# Patient Record
Sex: Male | Born: 1935 | Race: Black or African American | Hispanic: No | Marital: Married | Smoking: Never smoker
Health system: Southern US, Community
[De-identification: ages and names within clinical notes are randomized; demographics above are authoritative.]

## PROBLEM LIST (undated history)

## (undated) DIAGNOSIS — I4891 Unspecified atrial fibrillation: Secondary | ICD-10-CM

## (undated) DIAGNOSIS — I2510 Atherosclerotic heart disease of native coronary artery without angina pectoris: Secondary | ICD-10-CM

## (undated) DIAGNOSIS — E119 Type 2 diabetes mellitus without complications: Secondary | ICD-10-CM

## (undated) DIAGNOSIS — I10 Essential (primary) hypertension: Secondary | ICD-10-CM

## (undated) DIAGNOSIS — Z8579 Personal history of other malignant neoplasms of lymphoid, hematopoietic and related tissues: Secondary | ICD-10-CM

## (undated) DIAGNOSIS — I639 Cerebral infarction, unspecified: Secondary | ICD-10-CM

## (undated) DIAGNOSIS — N400 Enlarged prostate without lower urinary tract symptoms: Secondary | ICD-10-CM

## (undated) DIAGNOSIS — E785 Hyperlipidemia, unspecified: Secondary | ICD-10-CM

## (undated) DIAGNOSIS — K219 Gastro-esophageal reflux disease without esophagitis: Secondary | ICD-10-CM

## (undated) DIAGNOSIS — K279 Peptic ulcer, site unspecified, unspecified as acute or chronic, without hemorrhage or perforation: Secondary | ICD-10-CM

## (undated) DIAGNOSIS — C9002 Multiple myeloma in relapse: Principal | ICD-10-CM

## (undated) DIAGNOSIS — C801 Malignant (primary) neoplasm, unspecified: Secondary | ICD-10-CM

## (undated) DIAGNOSIS — Z8719 Personal history of other diseases of the digestive system: Secondary | ICD-10-CM

## (undated) DIAGNOSIS — E871 Hypo-osmolality and hyponatremia: Secondary | ICD-10-CM

## (undated) DIAGNOSIS — I509 Heart failure, unspecified: Secondary | ICD-10-CM

## (undated) HISTORY — DX: Multiple myeloma in relapse: C90.02

## (undated) HISTORY — DX: Benign prostatic hyperplasia without lower urinary tract symptoms: N40.0

## (undated) HISTORY — DX: Essential (primary) hypertension: I10

## (undated) HISTORY — DX: Type 2 diabetes mellitus without complications: E11.9

## (undated) HISTORY — DX: Gastro-esophageal reflux disease without esophagitis: K21.9

## (undated) HISTORY — DX: Heart failure, unspecified: I50.9

## (undated) HISTORY — DX: Hyperlipidemia, unspecified: E78.5

## (undated) HISTORY — PX: CORONARY ANGIOPLASTY: SHX604

## (undated) HISTORY — PX: EYE SURGERY: SHX253

## (undated) HISTORY — DX: Personal history of other malignant neoplasms of lymphoid, hematopoietic and related tissues: Z85.79

## (undated) HISTORY — DX: Unspecified atrial fibrillation: I48.91

## (undated) HISTORY — DX: Personal history of other diseases of the digestive system: Z87.19

## (undated) HISTORY — PX: CARDIAC CATHETERIZATION: SHX172

## (undated) HISTORY — DX: Peptic ulcer, site unspecified, unspecified as acute or chronic, without hemorrhage or perforation: K27.9

## (undated) HISTORY — DX: Atherosclerotic heart disease of native coronary artery without angina pectoris: I25.10

## (undated) HISTORY — DX: Hypo-osmolality and hyponatremia: E87.1

---

## 2005-07-26 ENCOUNTER — Ambulatory Visit: Payer: Self-pay | Admitting: Family Medicine

## 2005-09-07 ENCOUNTER — Encounter (HOSPITAL_COMMUNITY): Admission: RE | Admit: 2005-09-07 | Discharge: 2005-10-07 | Payer: Self-pay | Admitting: Family Medicine

## 2005-09-21 ENCOUNTER — Emergency Department: Payer: Self-pay | Admitting: Emergency Medicine

## 2005-09-21 ENCOUNTER — Other Ambulatory Visit: Payer: Self-pay

## 2005-11-16 ENCOUNTER — Ambulatory Visit: Payer: Self-pay | Admitting: Anesthesiology

## 2005-11-22 ENCOUNTER — Ambulatory Visit: Payer: Self-pay | Admitting: Anesthesiology

## 2005-12-23 ENCOUNTER — Ambulatory Visit: Payer: Self-pay | Admitting: Anesthesiology

## 2005-12-27 ENCOUNTER — Ambulatory Visit: Payer: Self-pay | Admitting: Anesthesiology

## 2006-02-09 ENCOUNTER — Ambulatory Visit: Payer: Self-pay | Admitting: Anesthesiology

## 2006-03-08 ENCOUNTER — Ambulatory Visit: Payer: Self-pay | Admitting: Anesthesiology

## 2006-04-18 ENCOUNTER — Ambulatory Visit: Payer: Self-pay | Admitting: Anesthesiology

## 2007-03-11 ENCOUNTER — Emergency Department: Payer: Self-pay | Admitting: Internal Medicine

## 2011-11-30 ENCOUNTER — Ambulatory Visit: Payer: Self-pay | Admitting: Oncology

## 2011-12-10 ENCOUNTER — Ambulatory Visit: Payer: Self-pay | Admitting: Oncology

## 2011-12-10 LAB — COMPREHENSIVE METABOLIC PANEL
Alkaline Phosphatase: 103 U/L (ref 50–136)
Anion Gap: 9 (ref 7–16)
BUN: 13 mg/dL (ref 7–18)
Bilirubin,Total: 0.5 mg/dL (ref 0.2–1.0)
Calcium, Total: 8.3 mg/dL — ABNORMAL LOW (ref 8.5–10.1)
Co2: 24 mmol/L (ref 21–32)
Creatinine: 1.13 mg/dL (ref 0.60–1.30)
EGFR (Non-African Amer.): >60
Osmolality: 274 (ref 275–301)
Potassium: 3.8 mmol/L (ref 3.5–5.1)
SGPT (ALT): 73 U/L

## 2011-12-10 LAB — CBC CANCER CENTER
Basophil #: 0 x10 3/mm (ref 0.0–0.1)
Eosinophil #: 0 x10 3/mm (ref 0.0–0.7)
HCT: 29.9 % — ABNORMAL LOW (ref 40.0–52.0)
HGB: 10.1 g/dL — ABNORMAL LOW (ref 13.0–18.0)
Monocyte %: 2.3 %
Neutrophil #: 2.5 x10 3/mm (ref 1.4–6.5)
Neutrophil %: 61.2 %
Platelet: 119 x10 3/mm — ABNORMAL LOW (ref 150–440)
RBC: 3.01 10*6/uL — ABNORMAL LOW (ref 4.40–5.90)
RDW: 18.4 % — ABNORMAL HIGH (ref 11.5–14.5)

## 2011-12-10 LAB — URINALYSIS, COMPLETE
Bacteria: NONE SEEN
Bilirubin,UR: NEGATIVE
Glucose,UR: NEGATIVE mg/dL (ref 0–75)
Ph: 5 (ref 4.5–8.0)
Specific Gravity: 1.009 (ref 1.003–1.030)
Squamous Epithelial: NONE SEEN
WBC UR: NONE SEEN /HPF (ref 0–5)

## 2011-12-12 LAB — URINE CULTURE

## 2011-12-13 ENCOUNTER — Ambulatory Visit: Payer: Self-pay | Admitting: Oncology

## 2011-12-17 LAB — CBC CANCER CENTER
Eosinophil #: 0.1 x10 3/mm (ref 0.0–0.7)
Eosinophil %: 1.9 %
Lymphocyte %: 42.2 %
MCHC: 33.4 g/dL (ref 32.0–36.0)
Monocyte #: 0.3 x10 3/mm (ref 0.2–1.0)
Neutrophil %: 48.1 %
Platelet: 178 x10 3/mm (ref 150–440)
RDW: 18.4 % — ABNORMAL HIGH (ref 11.5–14.5)
WBC: 3.6 x10 3/mm — ABNORMAL LOW (ref 3.8–10.6)

## 2011-12-17 LAB — BASIC METABOLIC PANEL
BUN: 16 mg/dL (ref 7–18)
Calcium, Total: 8.2 mg/dL — ABNORMAL LOW (ref 8.5–10.1)
Co2: 27 mmol/L (ref 21–32)
EGFR (African American): 60 — ABNORMAL LOW
EGFR (Non-African Amer.): 52 — ABNORMAL LOW
Osmolality: 275 (ref 275–301)
Sodium: 135 mmol/L — ABNORMAL LOW (ref 136–145)

## 2011-12-24 LAB — BASIC METABOLIC PANEL
Anion Gap: 9 (ref 7–16)
BUN: 11 mg/dL (ref 7–18)
Co2: 26 mmol/L (ref 21–32)
Creatinine: 1.21 mg/dL (ref 0.60–1.30)
EGFR (African American): >60
EGFR (Non-African Amer.): 59 — ABNORMAL LOW
Glucose: 153 mg/dL — ABNORMAL HIGH (ref 65–99)
Osmolality: 280 (ref 275–301)
Potassium: 3.7 mmol/L (ref 3.5–5.1)
Sodium: 139 mmol/L (ref 136–145)

## 2011-12-24 LAB — CBC CANCER CENTER
Basophil #: 0 x10 3/mm (ref 0.0–0.1)
Eosinophil %: 1.5 %
Lymphocyte #: 1.2 x10 3/mm (ref 1.0–3.6)
MCH: 33.5 pg (ref 26.0–34.0)
MCHC: 33.1 g/dL (ref 32.0–36.0)
Monocyte %: 5.5 %
Neutrophil #: 1.8 x10 3/mm (ref 1.4–6.5)
Neutrophil %: 56 %
Platelet: 224 x10 3/mm (ref 150–440)
RBC: 3.01 10*6/uL — ABNORMAL LOW (ref 4.40–5.90)
WBC: 3.2 x10 3/mm — ABNORMAL LOW (ref 3.8–10.6)

## 2011-12-27 LAB — URINE IEP, RANDOM

## 2011-12-27 LAB — KAPPA/LAMBDA FREE LIGHT CHAINS (ARMC)

## 2011-12-27 LAB — PROT IMMUNOELECTROPHORES(ARMC)

## 2011-12-31 LAB — BASIC METABOLIC PANEL
Anion Gap: 7 (ref 7–16)
Calcium, Total: 8.2 mg/dL — ABNORMAL LOW (ref 8.5–10.1)
Chloride: 106 mmol/L (ref 98–107)
Co2: 25 mmol/L (ref 21–32)
Creatinine: 1.06 mg/dL (ref 0.60–1.30)
EGFR (African American): >60
EGFR (Non-African Amer.): >60
Glucose: 144 mg/dL — ABNORMAL HIGH (ref 65–99)
Potassium: 3.7 mmol/L (ref 3.5–5.1)
Sodium: 138 mmol/L (ref 136–145)

## 2011-12-31 LAB — CBC CANCER CENTER
Basophil #: 0 x10 3/mm (ref 0.0–0.1)
Eosinophil #: 0 x10 3/mm (ref 0.0–0.7)
HCT: 32.7 % — ABNORMAL LOW (ref 40.0–52.0)
MCH: 34.8 pg — ABNORMAL HIGH (ref 26.0–34.0)
MCHC: 34.2 g/dL (ref 32.0–36.0)
MCV: 102 fL — ABNORMAL HIGH (ref 80–100)
Monocyte #: 0.2 x10 3/mm (ref 0.2–1.0)
Neutrophil #: 1.9 x10 3/mm (ref 1.4–6.5)
Neutrophil %: 55.5 %
Platelet: 172 x10 3/mm (ref 150–440)
RBC: 3.22 10*6/uL — ABNORMAL LOW (ref 4.40–5.90)
RDW: 19.5 % — ABNORMAL HIGH (ref 11.5–14.5)

## 2012-01-07 LAB — BASIC METABOLIC PANEL
Anion Gap: 11 (ref 7–16)
BUN: 17 mg/dL (ref 7–18)
Calcium, Total: 8.4 mg/dL — ABNORMAL LOW (ref 8.5–10.1)
Chloride: 103 mmol/L (ref 98–107)
Creatinine: 1.18 mg/dL (ref 0.60–1.30)
EGFR (African American): >60
Glucose: 192 mg/dL — ABNORMAL HIGH (ref 65–99)
Osmolality: 281 (ref 275–301)
Potassium: 3.9 mmol/L (ref 3.5–5.1)
Sodium: 137 mmol/L (ref 136–145)

## 2012-01-07 LAB — CBC CANCER CENTER
Basophil #: 0 x10 3/mm (ref 0.0–0.1)
Basophil %: 0.6 %
HCT: 36 % — ABNORMAL LOW (ref 40.0–52.0)
HGB: 12.2 g/dL — ABNORMAL LOW (ref 13.0–18.0)
Lymphocyte #: 1.1 x10 3/mm (ref 1.0–3.6)
Lymphocyte %: 36.7 %
MCH: 34.9 pg — ABNORMAL HIGH (ref 26.0–34.0)
Monocyte #: 0.3 x10 3/mm (ref 0.2–1.0)
Monocyte %: 9.6 %
Neutrophil #: 1.6 x10 3/mm (ref 1.4–6.5)
Platelet: 158 x10 3/mm (ref 150–440)
RBC: 3.5 10*6/uL — ABNORMAL LOW (ref 4.40–5.90)
RDW: 19 % — ABNORMAL HIGH (ref 11.5–14.5)
WBC: 3.1 x10 3/mm — ABNORMAL LOW (ref 3.8–10.6)

## 2012-01-13 ENCOUNTER — Ambulatory Visit: Payer: Self-pay | Admitting: Oncology

## 2012-01-14 LAB — CBC CANCER CENTER
Basophil #: 0 x10 3/mm (ref 0.0–0.1)
Basophil %: 0.8 %
Eosinophil #: 0 x10 3/mm (ref 0.0–0.7)
HCT: 34.8 % — ABNORMAL LOW (ref 40.0–52.0)
Lymphocyte %: 35.1 %
MCH: 35.3 pg — ABNORMAL HIGH (ref 26.0–34.0)
MCHC: 34.6 g/dL (ref 32.0–36.0)
MCV: 102 fL — ABNORMAL HIGH (ref 80–100)
Monocyte #: 0.3 x10 3/mm (ref 0.2–1.0)
Monocyte %: 9 %
Platelet: 155 x10 3/mm (ref 150–440)
RDW: 18.2 % — ABNORMAL HIGH (ref 11.5–14.5)
WBC: 3.8 x10 3/mm (ref 3.8–10.6)

## 2012-01-14 LAB — BASIC METABOLIC PANEL
Anion Gap: 6 — ABNORMAL LOW (ref 7–16)
BUN: 13 mg/dL (ref 7–18)
Calcium, Total: 8.2 mg/dL — ABNORMAL LOW (ref 8.5–10.1)
Co2: 28 mmol/L (ref 21–32)
Creatinine: 1.22 mg/dL (ref 0.60–1.30)
EGFR (African American): >60
Potassium: 3.5 mmol/L (ref 3.5–5.1)

## 2012-01-28 LAB — BASIC METABOLIC PANEL
Anion Gap: 11 (ref 7–16)
Co2: 24 mmol/L (ref 21–32)
Creatinine: 1.19 mg/dL (ref 0.60–1.30)
EGFR (African American): >60
Glucose: 147 mg/dL — ABNORMAL HIGH (ref 65–99)
Osmolality: 282 (ref 275–301)
Sodium: 140 mmol/L (ref 136–145)

## 2012-01-28 LAB — CBC CANCER CENTER
Basophil #: 0 x10 3/mm (ref 0.0–0.1)
Eosinophil %: 1.2 %
HCT: 35.2 % — ABNORMAL LOW (ref 40.0–52.0)
HGB: 11.8 g/dL — ABNORMAL LOW (ref 13.0–18.0)
Lymphocyte #: 1.3 x10 3/mm (ref 1.0–3.6)
Lymphocyte %: 40.7 %
MCH: 34.4 pg — ABNORMAL HIGH (ref 26.0–34.0)
MCHC: 33.5 g/dL (ref 32.0–36.0)
Monocyte %: 9.1 %
Neutrophil #: 1.6 x10 3/mm (ref 1.4–6.5)
Neutrophil %: 48.5 %
RBC: 3.43 10*6/uL — ABNORMAL LOW (ref 4.40–5.90)
WBC: 3.3 x10 3/mm — ABNORMAL LOW (ref 3.8–10.6)

## 2012-02-04 LAB — CBC CANCER CENTER
Basophil %: 0.6 %
Eosinophil %: 1 %
HCT: 35.7 % — ABNORMAL LOW (ref 40.0–52.0)
HGB: 12.2 g/dL — ABNORMAL LOW (ref 13.0–18.0)
Lymphocyte #: 1.4 x10 3/mm (ref 1.0–3.6)
Lymphocyte %: 40.3 %
Monocyte %: 11.5 %
Neutrophil #: 1.6 x10 3/mm (ref 1.4–6.5)
Neutrophil %: 46.6 %
RBC: 3.53 10*6/uL — ABNORMAL LOW (ref 4.40–5.90)
RDW: 16.2 % — ABNORMAL HIGH (ref 11.5–14.5)
WBC: 3.5 x10 3/mm — ABNORMAL LOW (ref 3.8–10.6)

## 2012-02-04 LAB — BASIC METABOLIC PANEL
Anion Gap: 10 (ref 7–16)
BUN: 14 mg/dL (ref 7–18)
Calcium, Total: 8.3 mg/dL — ABNORMAL LOW (ref 8.5–10.1)
Chloride: 106 mmol/L (ref 98–107)
Co2: 26 mmol/L (ref 21–32)
EGFR (African American): >60
Glucose: 145 mg/dL — ABNORMAL HIGH (ref 65–99)
Osmolality: 286 (ref 275–301)
Potassium: 3.8 mmol/L (ref 3.5–5.1)
Sodium: 142 mmol/L (ref 136–145)

## 2012-02-11 LAB — CBC CANCER CENTER
Basophil #: 0 x10 3/mm (ref 0.0–0.1)
Basophil %: 0.6 %
Eosinophil %: 2 %
HGB: 12.9 g/dL — ABNORMAL LOW (ref 13.0–18.0)
MCH: 34.3 pg — ABNORMAL HIGH (ref 26.0–34.0)
MCHC: 33.8 g/dL (ref 32.0–36.0)
MCV: 102 fL — ABNORMAL HIGH (ref 80–100)
Neutrophil #: 1.8 x10 3/mm (ref 1.4–6.5)
Neutrophil %: 52.4 %
RBC: 3.77 10*6/uL — ABNORMAL LOW (ref 4.40–5.90)

## 2012-02-11 LAB — BASIC METABOLIC PANEL
Anion Gap: 9 (ref 7–16)
BUN: 17 mg/dL (ref 7–18)
Calcium, Total: 8.4 mg/dL — ABNORMAL LOW (ref 8.5–10.1)
Chloride: 107 mmol/L (ref 98–107)
Creatinine: 1.24 mg/dL (ref 0.60–1.30)
Osmolality: 286 (ref 275–301)

## 2012-02-13 ENCOUNTER — Ambulatory Visit: Payer: Self-pay | Admitting: Oncology

## 2012-02-18 LAB — CBC CANCER CENTER
Eosinophil #: 0.1 x10 3/mm (ref 0.0–0.7)
Eosinophil %: 1.5 %
HCT: 37.4 % — ABNORMAL LOW (ref 40.0–52.0)
HGB: 12.6 g/dL — ABNORMAL LOW (ref 13.0–18.0)
Lymphocyte %: 20.9 %
MCV: 101 fL — ABNORMAL HIGH (ref 80–100)
Monocyte %: 8.4 %
Neutrophil %: 68.3 %
Platelet: 156 x10 3/mm (ref 150–440)
RBC: 3.69 10*6/uL — ABNORMAL LOW (ref 4.40–5.90)
WBC: 5.7 x10 3/mm (ref 3.8–10.6)

## 2012-02-18 LAB — BASIC METABOLIC PANEL
Anion Gap: 9 (ref 7–16)
Chloride: 107 mmol/L (ref 98–107)
Co2: 24 mmol/L (ref 21–32)
Osmolality: 286 (ref 275–301)
Potassium: 3.6 mmol/L (ref 3.5–5.1)

## 2012-02-18 LAB — PROTIME-INR: Prothrombin Time: 15.9 secs — ABNORMAL HIGH (ref 11.5–14.7)

## 2012-02-25 LAB — CBC CANCER CENTER
Basophil #: 0.1 x10 3/mm (ref 0.0–0.1)
Eosinophil #: 0.1 x10 3/mm (ref 0.0–0.7)
Eosinophil %: 1.9 %
HGB: 12.6 g/dL — ABNORMAL LOW (ref 13.0–18.0)
Lymphocyte #: 1.1 x10 3/mm (ref 1.0–3.6)
MCH: 34.1 pg — ABNORMAL HIGH (ref 26.0–34.0)
Monocyte #: 0.4 x10 3/mm (ref 0.2–1.0)
Monocyte %: 7.8 %
Neutrophil %: 67.1 %
Platelet: 163 x10 3/mm (ref 150–440)
RBC: 3.7 10*6/uL — ABNORMAL LOW (ref 4.40–5.90)
RDW: 15.1 % — ABNORMAL HIGH (ref 11.5–14.5)

## 2012-02-25 LAB — PROTIME-INR
INR: 2.6
Prothrombin Time: 27.8 secs — ABNORMAL HIGH (ref 11.5–14.7)

## 2012-02-25 LAB — BASIC METABOLIC PANEL
Calcium, Total: 8.2 mg/dL — ABNORMAL LOW (ref 8.5–10.1)
Chloride: 108 mmol/L — ABNORMAL HIGH (ref 98–107)
Co2: 25 mmol/L (ref 21–32)
EGFR (African American): 49 — ABNORMAL LOW
EGFR (Non-African Amer.): 42 — ABNORMAL LOW
Glucose: 215 mg/dL — ABNORMAL HIGH (ref 65–99)
Osmolality: 290 (ref 275–301)
Potassium: 3.6 mmol/L (ref 3.5–5.1)
Sodium: 142 mmol/L (ref 136–145)

## 2012-03-03 LAB — BASIC METABOLIC PANEL
Calcium, Total: 7.7 mg/dL — ABNORMAL LOW (ref 8.5–10.1)
Chloride: 105 mmol/L (ref 98–107)
Co2: 26 mmol/L (ref 21–32)
Creatinine: 1.06 mg/dL (ref 0.60–1.30)
EGFR (Non-African Amer.): >60
Potassium: 3.8 mmol/L (ref 3.5–5.1)
Sodium: 141 mmol/L (ref 136–145)

## 2012-03-03 LAB — CBC CANCER CENTER
Basophil %: 1.1 %
Eosinophil #: 0.1 x10 3/mm (ref 0.0–0.7)
HGB: 12.8 g/dL — ABNORMAL LOW (ref 13.0–18.0)
Lymphocyte #: 1.1 x10 3/mm (ref 1.0–3.6)
MCH: 33.8 pg (ref 26.0–34.0)
MCHC: 33.5 g/dL (ref 32.0–36.0)
Monocyte #: 0.5 x10 3/mm (ref 0.2–1.0)
Neutrophil #: 3.2 x10 3/mm (ref 1.4–6.5)
Platelet: 191 x10 3/mm (ref 150–440)
RBC: 3.77 10*6/uL — ABNORMAL LOW (ref 4.40–5.90)
WBC: 5 x10 3/mm (ref 3.8–10.6)

## 2012-03-03 LAB — PROTIME-INR: INR: 2.9

## 2012-03-06 LAB — PROT IMMUNOELECTROPHORES(ARMC)

## 2012-03-10 LAB — CBC CANCER CENTER
Basophil %: 0.7 %
Eosinophil #: 0.1 x10 3/mm (ref 0.0–0.7)
Eosinophil %: 2.1 %
HCT: 36.9 % — ABNORMAL LOW (ref 40.0–52.0)
HGB: 12.3 g/dL — ABNORMAL LOW (ref 13.0–18.0)
Lymphocyte #: 0.8 x10 3/mm — ABNORMAL LOW (ref 1.0–3.6)
MCH: 33.2 pg (ref 26.0–34.0)
MCHC: 33.3 g/dL (ref 32.0–36.0)
MCV: 100 fL (ref 80–100)
Monocyte #: 0.5 x10 3/mm (ref 0.2–1.0)
Monocyte %: 7.8 %
Platelet: 165 x10 3/mm (ref 150–440)
RBC: 3.7 10*6/uL — ABNORMAL LOW (ref 4.40–5.90)

## 2012-03-10 LAB — BASIC METABOLIC PANEL
Anion Gap: 10 (ref 7–16)
Calcium, Total: 8.1 mg/dL — ABNORMAL LOW (ref 8.5–10.1)
Chloride: 108 mmol/L — ABNORMAL HIGH (ref 98–107)
Co2: 23 mmol/L (ref 21–32)
Creatinine: 1.19 mg/dL (ref 0.60–1.30)
EGFR (African American): >60
EGFR (Non-African Amer.): 60 — ABNORMAL LOW
Glucose: 165 mg/dL — ABNORMAL HIGH (ref 65–99)
Osmolality: 285 (ref 275–301)
Potassium: 3.4 mmol/L — ABNORMAL LOW (ref 3.5–5.1)
Sodium: 141 mmol/L (ref 136–145)

## 2012-03-10 LAB — PROTIME-INR: Prothrombin Time: 36.4 secs — ABNORMAL HIGH (ref 11.5–14.7)

## 2012-03-14 ENCOUNTER — Ambulatory Visit: Payer: Self-pay | Admitting: Oncology

## 2012-03-17 LAB — BASIC METABOLIC PANEL
BUN: 13 mg/dL (ref 7–18)
Chloride: 104 mmol/L (ref 98–107)
Creatinine: 1.05 mg/dL (ref 0.60–1.30)
Glucose: 184 mg/dL — ABNORMAL HIGH (ref 65–99)
Osmolality: 281 (ref 275–301)
Potassium: 3.7 mmol/L (ref 3.5–5.1)
Sodium: 138 mmol/L (ref 136–145)

## 2012-03-17 LAB — CBC CANCER CENTER
Eosinophil %: 3.4 %
HGB: 12.2 g/dL — ABNORMAL LOW (ref 13.0–18.0)
Lymphocyte #: 0.9 x10 3/mm — ABNORMAL LOW (ref 1.0–3.6)
Lymphocyte %: 16.8 %
MCHC: 33.4 g/dL (ref 32.0–36.0)
MCV: 100 fL (ref 80–100)
Monocyte %: 7.9 %
Neutrophil #: 3.8 x10 3/mm (ref 1.4–6.5)
Neutrophil %: 71.1 %
RBC: 3.65 10*6/uL — ABNORMAL LOW (ref 4.40–5.90)
RDW: 14.9 % — ABNORMAL HIGH (ref 11.5–14.5)
WBC: 5.4 x10 3/mm (ref 3.8–10.6)

## 2012-03-17 LAB — PROTIME-INR: INR: 1.1

## 2012-03-24 LAB — BASIC METABOLIC PANEL
Anion Gap: 10 (ref 7–16)
BUN: 14 mg/dL (ref 7–18)
EGFR (African American): >60
EGFR (Non-African Amer.): >60
Glucose: 135 mg/dL — ABNORMAL HIGH (ref 65–99)
Sodium: 140 mmol/L (ref 136–145)

## 2012-03-24 LAB — CBC CANCER CENTER
Basophil %: 1 %
Eosinophil #: 0.3 x10 3/mm (ref 0.0–0.7)
HCT: 39.1 % — ABNORMAL LOW (ref 40.0–52.0)
Lymphocyte #: 1.1 x10 3/mm (ref 1.0–3.6)
Lymphocyte %: 18.3 %
MCHC: 33 g/dL (ref 32.0–36.0)
Monocyte #: 0.5 x10 3/mm (ref 0.2–1.0)
Neutrophil #: 4.1 x10 3/mm (ref 1.4–6.5)
Neutrophil %: 68.8 %
RDW: 14.7 % — ABNORMAL HIGH (ref 11.5–14.5)

## 2012-03-24 LAB — PROTIME-INR: INR: 1.5

## 2012-03-28 LAB — PROT IMMUNOELECTROPHORES(ARMC)

## 2012-03-31 LAB — CBC CANCER CENTER
Basophil #: 0 x10 3/mm (ref 0.0–0.1)
Basophil %: 0.7 %
Eosinophil %: 4.4 %
HGB: 12.9 g/dL — ABNORMAL LOW (ref 13.0–18.0)
Lymphocyte #: 0.8 x10 3/mm — ABNORMAL LOW (ref 1.0–3.6)
MCH: 32.8 pg (ref 26.0–34.0)
MCV: 99 fL (ref 80–100)
Monocyte #: 0.3 x10 3/mm (ref 0.2–1.0)
Monocyte %: 6.2 %
Neutrophil %: 73 %
RBC: 3.93 10*6/uL — ABNORMAL LOW (ref 4.40–5.90)

## 2012-03-31 LAB — BASIC METABOLIC PANEL
Calcium, Total: 8.2 mg/dL — ABNORMAL LOW (ref 8.5–10.1)
Chloride: 105 mmol/L (ref 98–107)
Co2: 25 mmol/L (ref 21–32)
EGFR (African American): >60
Glucose: 155 mg/dL — ABNORMAL HIGH (ref 65–99)
Potassium: 3.8 mmol/L (ref 3.5–5.1)
Sodium: 140 mmol/L (ref 136–145)

## 2012-04-07 LAB — CBC CANCER CENTER
Basophil #: 0 x10 3/mm (ref 0.0–0.1)
Basophil %: 0.5 %
Eosinophil #: 0.3 x10 3/mm (ref 0.0–0.7)
HCT: 42.2 % (ref 40.0–52.0)
HGB: 13.8 g/dL (ref 13.0–18.0)
Lymphocyte %: 13.6 %
MCH: 32.8 pg (ref 26.0–34.0)
MCHC: 32.7 g/dL (ref 32.0–36.0)
Monocyte #: 0.4 x10 3/mm (ref 0.2–1.0)
Neutrophil #: 4.6 x10 3/mm (ref 1.4–6.5)
Neutrophil %: 75.3 %
RBC: 4.2 10*6/uL — ABNORMAL LOW (ref 4.40–5.90)
WBC: 6.1 x10 3/mm (ref 3.8–10.6)

## 2012-04-07 LAB — BASIC METABOLIC PANEL
Anion Gap: 11 (ref 7–16)
Calcium, Total: 8.5 mg/dL (ref 8.5–10.1)
Chloride: 104 mmol/L (ref 98–107)
Co2: 25 mmol/L (ref 21–32)
EGFR (African American): >60
EGFR (Non-African Amer.): >60
Glucose: 115 mg/dL — ABNORMAL HIGH (ref 65–99)
Osmolality: 282 (ref 275–301)
Potassium: 4 mmol/L (ref 3.5–5.1)
Sodium: 140 mmol/L (ref 136–145)

## 2012-04-14 ENCOUNTER — Ambulatory Visit: Payer: Self-pay | Admitting: Oncology

## 2012-04-14 LAB — CBC CANCER CENTER
Basophil #: 0 x10 3/mm (ref 0.0–0.1)
Eosinophil #: 0.2 x10 3/mm (ref 0.0–0.7)
Lymphocyte #: 0.8 x10 3/mm — ABNORMAL LOW (ref 1.0–3.6)
MCH: 32.7 pg (ref 26.0–34.0)
MCHC: 32.9 g/dL (ref 32.0–36.0)
MCV: 99 fL (ref 80–100)
Monocyte #: 0.4 x10 3/mm (ref 0.2–1.0)
Platelet: 141 x10 3/mm — ABNORMAL LOW (ref 150–440)
RBC: 3.76 10*6/uL — ABNORMAL LOW (ref 4.40–5.90)
RDW: 14.6 % — ABNORMAL HIGH (ref 11.5–14.5)

## 2012-04-14 LAB — PROTIME-INR
INR: 2.8
Prothrombin Time: 29.3 secs — ABNORMAL HIGH (ref 11.5–14.7)

## 2012-04-14 LAB — BASIC METABOLIC PANEL
Calcium, Total: 8.1 mg/dL — ABNORMAL LOW (ref 8.5–10.1)
Chloride: 107 mmol/L (ref 98–107)
Creatinine: 1.07 mg/dL (ref 0.60–1.30)
EGFR (Non-African Amer.): >60
Glucose: 104 mg/dL — ABNORMAL HIGH (ref 65–99)
Potassium: 3.9 mmol/L (ref 3.5–5.1)
Sodium: 140 mmol/L (ref 136–145)

## 2012-04-21 LAB — CBC CANCER CENTER
Basophil %: 0.8 %
Eosinophil %: 4.5 %
HGB: 12.8 g/dL — ABNORMAL LOW (ref 13.0–18.0)
Lymphocyte #: 0.8 x10 3/mm — ABNORMAL LOW (ref 1.0–3.6)
Lymphocyte %: 18.3 %
MCH: 33.2 pg (ref 26.0–34.0)
MCV: 99 fL (ref 80–100)
Monocyte #: 0.2 x10 3/mm (ref 0.2–1.0)
Neutrophil #: 3 x10 3/mm (ref 1.4–6.5)
RBC: 3.86 10*6/uL — ABNORMAL LOW (ref 4.40–5.90)
WBC: 4.2 x10 3/mm (ref 3.8–10.6)

## 2012-04-21 LAB — PROTIME-INR: INR: 2.5

## 2012-04-21 LAB — BASIC METABOLIC PANEL
Anion Gap: 11 (ref 7–16)
Calcium, Total: 8 mg/dL — ABNORMAL LOW (ref 8.5–10.1)
Co2: 24 mmol/L (ref 21–32)
Glucose: 162 mg/dL — ABNORMAL HIGH (ref 65–99)
Osmolality: 282 (ref 275–301)
Potassium: 3.7 mmol/L (ref 3.5–5.1)
Sodium: 139 mmol/L (ref 136–145)

## 2012-04-24 LAB — URINE IEP, RANDOM

## 2012-04-24 LAB — PROT IMMUNOELECTROPHORES(ARMC)

## 2012-04-28 LAB — BASIC METABOLIC PANEL
Anion Gap: 12 (ref 7–16)
BUN: 15 mg/dL (ref 7–18)
Calcium, Total: 8 mg/dL — ABNORMAL LOW (ref 8.5–10.1)
Chloride: 110 mmol/L — ABNORMAL HIGH (ref 98–107)
Co2: 22 mmol/L (ref 21–32)
Osmolality: 290 (ref 275–301)
Potassium: 4.1 mmol/L (ref 3.5–5.1)
Sodium: 144 mmol/L (ref 136–145)

## 2012-04-28 LAB — CBC CANCER CENTER
Basophil %: 0.6 %
HCT: 38.5 % — ABNORMAL LOW (ref 40.0–52.0)
HGB: 12.9 g/dL — ABNORMAL LOW (ref 13.0–18.0)
Lymphocyte %: 10.5 %
MCHC: 33.5 g/dL (ref 32.0–36.0)
MCV: 100 fL (ref 80–100)
Monocyte %: 7 %
Neutrophil #: 4.4 x10 3/mm (ref 1.4–6.5)
RBC: 3.87 10*6/uL — ABNORMAL LOW (ref 4.40–5.90)
WBC: 5.5 x10 3/mm (ref 3.8–10.6)

## 2012-04-28 LAB — PROTIME-INR: INR: 2.8

## 2012-05-05 LAB — CBC CANCER CENTER
Basophil #: 0 x10 3/mm (ref 0.0–0.1)
Basophil %: 0.7 %
HCT: 37.6 % — ABNORMAL LOW (ref 40.0–52.0)
HGB: 12.4 g/dL — ABNORMAL LOW (ref 13.0–18.0)
Lymphocyte #: 0.7 x10 3/mm — ABNORMAL LOW (ref 1.0–3.6)
Lymphocyte %: 13.4 %
MCH: 32.8 pg (ref 26.0–34.0)
MCHC: 32.9 g/dL (ref 32.0–36.0)
MCV: 100 fL (ref 80–100)
Monocyte %: 6.6 %
Neutrophil #: 4 x10 3/mm (ref 1.4–6.5)
RBC: 3.77 10*6/uL — ABNORMAL LOW (ref 4.40–5.90)
RDW: 15.2 % — ABNORMAL HIGH (ref 11.5–14.5)
WBC: 5.3 x10 3/mm (ref 3.8–10.6)

## 2012-05-05 LAB — BASIC METABOLIC PANEL
Anion Gap: 10 (ref 7–16)
BUN: 18 mg/dL (ref 7–18)
Calcium, Total: 8.4 mg/dL — ABNORMAL LOW (ref 8.5–10.1)
Creatinine: 1.04 mg/dL (ref 0.60–1.30)
EGFR (African American): >60
EGFR (Non-African Amer.): >60
Glucose: 146 mg/dL — ABNORMAL HIGH (ref 65–99)
Osmolality: 280 (ref 275–301)
Potassium: 4.3 mmol/L (ref 3.5–5.1)

## 2012-05-12 LAB — CBC CANCER CENTER
Basophil #: 0 x10 3/mm (ref 0.0–0.1)
Eosinophil #: 0.1 x10 3/mm (ref 0.0–0.7)
Eosinophil %: 2 %
Lymphocyte #: 0.7 x10 3/mm — ABNORMAL LOW (ref 1.0–3.6)
Lymphocyte %: 13 %
MCH: 33.7 pg (ref 26.0–34.0)
MCHC: 34.6 g/dL (ref 32.0–36.0)
MCV: 98 fL (ref 80–100)
Monocyte #: 0.4 x10 3/mm (ref 0.2–1.0)
Neutrophil %: 76 %
Platelet: 134 x10 3/mm — ABNORMAL LOW (ref 150–440)
RBC: 3.62 10*6/uL — ABNORMAL LOW (ref 4.40–5.90)
RDW: 15.6 % — ABNORMAL HIGH (ref 11.5–14.5)

## 2012-05-12 LAB — BASIC METABOLIC PANEL
Anion Gap: 13 (ref 7–16)
Chloride: 106 mmol/L (ref 98–107)
Co2: 23 mmol/L (ref 21–32)
EGFR (Non-African Amer.): >60
Osmolality: 286 (ref 275–301)
Potassium: 3.8 mmol/L (ref 3.5–5.1)

## 2012-05-12 LAB — PROTIME-INR
INR: 2.2
Prothrombin Time: 24.3 secs — ABNORMAL HIGH (ref 11.5–14.7)

## 2012-05-14 ENCOUNTER — Ambulatory Visit: Payer: Self-pay | Admitting: Oncology

## 2012-05-19 LAB — BASIC METABOLIC PANEL
Anion Gap: 9 (ref 7–16)
BUN: 13 mg/dL (ref 7–18)
Chloride: 106 mmol/L (ref 98–107)
Co2: 26 mmol/L (ref 21–32)
Creatinine: 1.02 mg/dL (ref 0.60–1.30)
Potassium: 4 mmol/L (ref 3.5–5.1)
Sodium: 141 mmol/L (ref 136–145)

## 2012-05-19 LAB — CBC CANCER CENTER
Basophil %: 1 %
Eosinophil %: 2.5 %
HCT: 36.5 % — ABNORMAL LOW (ref 40.0–52.0)
HGB: 12.8 g/dL — ABNORMAL LOW (ref 13.0–18.0)
Lymphocyte #: 0.7 x10 3/mm — ABNORMAL LOW (ref 1.0–3.6)
MCH: 34.3 pg — ABNORMAL HIGH (ref 26.0–34.0)
MCHC: 35 g/dL (ref 32.0–36.0)
MCV: 98 fL (ref 80–100)
Monocyte %: 9.4 %
Neutrophil #: 3.2 x10 3/mm (ref 1.4–6.5)
Platelet: 146 x10 3/mm — ABNORMAL LOW (ref 150–440)
RBC: 3.72 10*6/uL — ABNORMAL LOW (ref 4.40–5.90)
WBC: 4.5 x10 3/mm (ref 3.8–10.6)

## 2012-05-19 LAB — PROTIME-INR: Prothrombin Time: 20.6 secs — ABNORMAL HIGH (ref 11.5–14.7)

## 2012-05-26 LAB — CBC CANCER CENTER
Basophil #: 0 x10 3/mm (ref 0.0–0.1)
Eosinophil %: 1.7 %
HCT: 36.8 % — ABNORMAL LOW (ref 40.0–52.0)
Lymphocyte #: 0.6 x10 3/mm — ABNORMAL LOW (ref 1.0–3.6)
MCH: 34.3 pg — ABNORMAL HIGH (ref 26.0–34.0)
MCV: 98 fL (ref 80–100)
Monocyte #: 0.4 x10 3/mm (ref 0.2–1.0)
Monocyte %: 7.7 %
Neutrophil #: 4 x10 3/mm (ref 1.4–6.5)
Platelet: 127 x10 3/mm — ABNORMAL LOW (ref 150–440)
RBC: 3.76 10*6/uL — ABNORMAL LOW (ref 4.40–5.90)
RDW: 16.1 % — ABNORMAL HIGH (ref 11.5–14.5)
WBC: 5.2 x10 3/mm (ref 3.8–10.6)

## 2012-05-26 LAB — BASIC METABOLIC PANEL
Anion Gap: 11 (ref 7–16)
BUN: 27 mg/dL — ABNORMAL HIGH (ref 7–18)
Co2: 23 mmol/L (ref 21–32)
Creatinine: 1.13 mg/dL (ref 0.60–1.30)
EGFR (African American): >60
EGFR (Non-African Amer.): >60
Glucose: 116 mg/dL — ABNORMAL HIGH (ref 65–99)
Sodium: 137 mmol/L (ref 136–145)

## 2012-05-26 LAB — PROTIME-INR: Prothrombin Time: 27.1 secs — ABNORMAL HIGH (ref 11.5–14.7)

## 2012-06-02 LAB — CBC CANCER CENTER
Basophil #: 0 x10 3/mm (ref 0.0–0.1)
Basophil %: 0.5 %
Eosinophil #: 0.1 x10 3/mm (ref 0.0–0.7)
Eosinophil %: 1.2 %
HCT: 36.6 % — ABNORMAL LOW (ref 40.0–52.0)
HGB: 12.8 g/dL — ABNORMAL LOW (ref 13.0–18.0)
Lymphocyte #: 0.6 x10 3/mm — ABNORMAL LOW (ref 1.0–3.6)
Lymphocyte %: 12.5 %
MCHC: 34.9 g/dL (ref 32.0–36.0)
Monocyte #: 0.3 x10 3/mm (ref 0.2–1.0)
Monocyte %: 6.7 %
Neutrophil #: 4.1 x10 3/mm (ref 1.4–6.5)

## 2012-06-02 LAB — BASIC METABOLIC PANEL
BUN: 14 mg/dL (ref 7–18)
Co2: 25 mmol/L (ref 21–32)
Creatinine: 1.04 mg/dL (ref 0.60–1.30)
EGFR (African American): >60
Osmolality: 290 (ref 275–301)
Sodium: 143 mmol/L (ref 136–145)

## 2012-06-02 LAB — PROTIME-INR: INR: 2.3

## 2012-06-09 LAB — BASIC METABOLIC PANEL
Anion Gap: 11 (ref 7–16)
BUN: 19 mg/dL — ABNORMAL HIGH (ref 7–18)
Calcium, Total: 8.3 mg/dL — ABNORMAL LOW (ref 8.5–10.1)
Chloride: 102 mmol/L (ref 98–107)
Co2: 26 mmol/L (ref 21–32)
Creatinine: 1.05 mg/dL (ref 0.60–1.30)
Glucose: 117 mg/dL — ABNORMAL HIGH (ref 65–99)
Sodium: 139 mmol/L (ref 136–145)

## 2012-06-09 LAB — CBC CANCER CENTER
Basophil #: 0 x10 3/mm (ref 0.0–0.1)
Eosinophil #: 0.1 x10 3/mm (ref 0.0–0.7)
Eosinophil %: 2.3 %
HGB: 13.3 g/dL (ref 13.0–18.0)
MCH: 34.3 pg — ABNORMAL HIGH (ref 26.0–34.0)
MCHC: 34.8 g/dL (ref 32.0–36.0)
MCV: 99 fL (ref 80–100)
Monocyte #: 0.4 x10 3/mm (ref 0.2–1.0)
Monocyte %: 9.4 %
Neutrophil #: 3.5 x10 3/mm (ref 1.4–6.5)
Platelet: 139 x10 3/mm — ABNORMAL LOW (ref 150–440)
RDW: 16.7 % — ABNORMAL HIGH (ref 11.5–14.5)
WBC: 4.7 x10 3/mm (ref 3.8–10.6)

## 2012-06-09 LAB — PROTIME-INR
INR: 2.5
Prothrombin Time: 27.4 secs — ABNORMAL HIGH (ref 11.5–14.7)

## 2012-06-14 ENCOUNTER — Ambulatory Visit: Payer: Self-pay | Admitting: Oncology

## 2012-06-16 LAB — BASIC METABOLIC PANEL
Anion Gap: 11 (ref 7–16)
BUN: 24 mg/dL — ABNORMAL HIGH (ref 7–18)
Calcium, Total: 8.5 mg/dL (ref 8.5–10.1)
EGFR (African American): >60
EGFR (Non-African Amer.): >60
Glucose: 212 mg/dL — ABNORMAL HIGH (ref 65–99)
Potassium: 3.6 mmol/L (ref 3.5–5.1)
Sodium: 140 mmol/L (ref 136–145)

## 2012-06-16 LAB — CBC CANCER CENTER
Basophil %: 0.5 %
HGB: 13.3 g/dL (ref 13.0–18.0)
Lymphocyte #: 0.6 x10 3/mm — ABNORMAL LOW (ref 1.0–3.6)
MCHC: 34.3 g/dL (ref 32.0–36.0)
Monocyte %: 6.8 %
Neutrophil %: 79.8 %
Platelet: 134 x10 3/mm — ABNORMAL LOW (ref 150–440)
RBC: 3.91 10*6/uL — ABNORMAL LOW (ref 4.40–5.90)
RDW: 16.1 % — ABNORMAL HIGH (ref 11.5–14.5)
WBC: 5.2 x10 3/mm (ref 3.8–10.6)

## 2012-06-16 LAB — PROTIME-INR: Prothrombin Time: 30.8 secs — ABNORMAL HIGH (ref 11.5–14.7)

## 2012-06-19 LAB — PROT IMMUNOELECTROPHORES(ARMC)

## 2012-06-23 LAB — BASIC METABOLIC PANEL
Anion Gap: 9 (ref 7–16)
Calcium, Total: 8.9 mg/dL (ref 8.5–10.1)
Chloride: 104 mmol/L (ref 98–107)
Osmolality: 284 (ref 275–301)
Sodium: 140 mmol/L (ref 136–145)

## 2012-06-23 LAB — CBC CANCER CENTER
Basophil #: 0 x10 3/mm (ref 0.0–0.1)
Eosinophil #: 0.1 x10 3/mm (ref 0.0–0.7)
Eosinophil %: 2.2 %
HCT: 37.6 % — ABNORMAL LOW (ref 40.0–52.0)
Lymphocyte #: 0.8 x10 3/mm — ABNORMAL LOW (ref 1.0–3.6)
Monocyte #: 0.5 x10 3/mm (ref 0.2–1.0)
Neutrophil %: 74.1 %
RBC: 3.82 10*6/uL — ABNORMAL LOW (ref 4.40–5.90)
RDW: 16.3 % — ABNORMAL HIGH (ref 11.5–14.5)

## 2012-06-30 LAB — CBC CANCER CENTER
Basophil #: 0 "x10 3/mm "
Basophil %: 0.4 %
Eosinophil #: 0.1 "x10 3/mm "
Eosinophil %: 1.2 %
HCT: 38.7 % — ABNORMAL LOW
HGB: 13.6 g/dL
Lymphocyte %: 8.5 %
Lymphs Abs: 0.6 "x10 3/mm " — ABNORMAL LOW
MCH: 34.6 pg — ABNORMAL HIGH
MCHC: 35.1 g/dL
MCV: 98 fL
Monocyte #: 0.4 "x10 3/mm "
Monocyte %: 6.6 %
Neutrophil #: 5.5 "x10 3/mm "
Neutrophil %: 83.3 %
Platelet: 156 "x10 3/mm "
RBC: 3.93 "x10 6/mm " — ABNORMAL LOW
RDW: 16.4 % — ABNORMAL HIGH
WBC: 6.7 "x10 3/mm "

## 2012-06-30 LAB — BASIC METABOLIC PANEL WITH GFR
Anion Gap: 9
BUN: 20 mg/dL — ABNORMAL HIGH
Calcium, Total: 9 mg/dL
Chloride: 106 mmol/L
Co2: 24 mmol/L
Creatinine: 1.03 mg/dL
EGFR (African American): >60
EGFR (Non-African Amer.): >60
Glucose: 127 mg/dL — ABNORMAL HIGH
Osmolality: 282
Potassium: 4.1 mmol/L
Sodium: 139 mmol/L

## 2012-06-30 LAB — PROTIME-INR
INR: 2.8
Prothrombin Time: 29.5 s — ABNORMAL HIGH

## 2012-07-03 LAB — PROT IMMUNOELECTROPHORES(ARMC)

## 2012-07-07 LAB — PROTIME-INR
INR: 3.7
Prothrombin Time: 36.9 secs — ABNORMAL HIGH (ref 11.5–14.7)

## 2012-07-14 LAB — BASIC METABOLIC PANEL
Anion Gap: 7 (ref 7–16)
BUN: 17 mg/dL (ref 7–18)
Calcium, Total: 8.5 mg/dL (ref 8.5–10.1)
Co2: 30 mmol/L (ref 21–32)
Creatinine: 1.06 mg/dL (ref 0.60–1.30)
EGFR (Non-African Amer.): >60
Potassium: 4.6 mmol/L (ref 3.5–5.1)

## 2012-07-14 LAB — CBC CANCER CENTER
Basophil #: 0 x10 3/mm (ref 0.0–0.1)
Basophil %: 0.6 %
Eosinophil %: 1.2 %
HCT: 38.1 % — ABNORMAL LOW (ref 40.0–52.0)
Lymphocyte %: 10.9 %
MCH: 34.3 pg — ABNORMAL HIGH (ref 26.0–34.0)
MCHC: 34.6 g/dL (ref 32.0–36.0)
MCV: 99 fL (ref 80–100)
Monocyte %: 8.3 %
Neutrophil #: 5.6 x10 3/mm (ref 1.4–6.5)
Neutrophil %: 79 %
Platelet: 149 x10 3/mm — ABNORMAL LOW (ref 150–440)
WBC: 7.1 x10 3/mm (ref 3.8–10.6)

## 2012-07-15 ENCOUNTER — Ambulatory Visit: Payer: Self-pay | Admitting: Oncology

## 2012-07-17 LAB — PROT IMMUNOELECTROPHORES(ARMC)

## 2012-07-21 LAB — CBC CANCER CENTER
Basophil %: 0.4 %
Eosinophil #: 0.1 x10 3/mm (ref 0.0–0.7)
Eosinophil %: 0.8 %
HGB: 12.6 g/dL — ABNORMAL LOW (ref 13.0–18.0)
Lymphocyte %: 8.1 %
MCH: 34.9 pg — ABNORMAL HIGH (ref 26.0–34.0)
MCV: 99 fL (ref 80–100)
Monocyte #: 0.6 x10 3/mm (ref 0.2–1.0)
Monocyte %: 7.1 %
Neutrophil %: 83.6 %
RBC: 3.61 10*6/uL — ABNORMAL LOW (ref 4.40–5.90)
WBC: 8.3 x10 3/mm (ref 3.8–10.6)

## 2012-07-21 LAB — BASIC METABOLIC PANEL
Anion Gap: 9 (ref 7–16)
BUN: 11 mg/dL (ref 7–18)
Calcium, Total: 8.4 mg/dL — ABNORMAL LOW (ref 8.5–10.1)
Chloride: 106 mmol/L (ref 98–107)
Co2: 29 mmol/L (ref 21–32)
Creatinine: 0.98 mg/dL (ref 0.60–1.30)
EGFR (African American): >60
EGFR (Non-African Amer.): >60
Sodium: 144 mmol/L (ref 136–145)

## 2012-08-04 LAB — CBC CANCER CENTER
Basophil %: 0.7 %
Eosinophil #: 0.1 x10 3/mm (ref 0.0–0.7)
Lymphocyte #: 0.9 x10 3/mm — ABNORMAL LOW (ref 1.0–3.6)
Lymphocyte %: 14.9 %
Monocyte %: 9.9 %
Neutrophil #: 4.4 x10 3/mm (ref 1.4–6.5)
Neutrophil %: 72.7 %
RBC: 3.8 10*6/uL — ABNORMAL LOW (ref 4.40–5.90)
WBC: 6 x10 3/mm (ref 3.8–10.6)

## 2012-08-04 LAB — COMPREHENSIVE METABOLIC PANEL
Albumin: 3.5 g/dL (ref 3.4–5.0)
Anion Gap: 8 (ref 7–16)
BUN: 21 mg/dL — ABNORMAL HIGH (ref 7–18)
Bilirubin,Total: 0.9 mg/dL (ref 0.2–1.0)
Calcium, Total: 8.8 mg/dL (ref 8.5–10.1)
Chloride: 104 mmol/L (ref 98–107)
Co2: 29 mmol/L (ref 21–32)
Glucose: 193 mg/dL — ABNORMAL HIGH (ref 65–99)
Osmolality: 289 (ref 275–301)
Potassium: 4.3 mmol/L (ref 3.5–5.1)
SGOT(AST): 16 U/L (ref 15–37)
SGPT (ALT): 21 U/L (ref 12–78)
Sodium: 141 mmol/L (ref 136–145)
Total Protein: 6.7 g/dL (ref 6.4–8.2)

## 2012-08-07 LAB — PROT IMMUNOELECTROPHORES(ARMC)

## 2012-08-12 ENCOUNTER — Ambulatory Visit: Payer: Self-pay | Admitting: Oncology

## 2012-08-21 LAB — COMPREHENSIVE METABOLIC PANEL
Albumin: 3.5 g/dL (ref 3.4–5.0)
Anion Gap: 7 (ref 7–16)
Bilirubin,Total: 0.9 mg/dL (ref 0.2–1.0)
Calcium, Total: 8.3 mg/dL — ABNORMAL LOW (ref 8.5–10.1)
Chloride: 105 mmol/L (ref 98–107)
Co2: 26 mmol/L (ref 21–32)
Creatinine: 1.12 mg/dL (ref 0.60–1.30)
EGFR (African American): >60
Glucose: 144 mg/dL — ABNORMAL HIGH (ref 65–99)
Osmolality: 281 (ref 275–301)
Potassium: 3.6 mmol/L (ref 3.5–5.1)
SGOT(AST): 18 U/L (ref 15–37)
SGPT (ALT): 20 U/L (ref 12–78)
Sodium: 138 mmol/L (ref 136–145)
Total Protein: 7.2 g/dL (ref 6.4–8.2)

## 2012-08-21 LAB — CBC CANCER CENTER
Basophil #: 0 x10 3/mm (ref 0.0–0.1)
Eosinophil #: 0 x10 3/mm (ref 0.0–0.7)
Eosinophil %: 0.1 %
HCT: 41.1 % (ref 40.0–52.0)
Lymphocyte #: 0.3 x10 3/mm — ABNORMAL LOW (ref 1.0–3.6)
MCV: 99 fL (ref 80–100)
Monocyte %: 3.3 %
Neutrophil %: 91 %
Platelet: 148 x10 3/mm — ABNORMAL LOW (ref 150–440)
RDW: 13.1 % (ref 11.5–14.5)
WBC: 6.1 x10 3/mm (ref 3.8–10.6)

## 2012-08-23 LAB — PROT IMMUNOELECTROPHORES(ARMC)

## 2012-09-01 LAB — CBC CANCER CENTER
Basophil %: 0.7 %
Eosinophil #: 0.1 x10 3/mm (ref 0.0–0.7)
HGB: 13 g/dL (ref 13.0–18.0)
MCH: 33.9 pg (ref 26.0–34.0)
MCHC: 34.9 g/dL (ref 32.0–36.0)
MCV: 97 fL (ref 80–100)
Monocyte #: 0.7 x10 3/mm (ref 0.2–1.0)
Platelet: 161 x10 3/mm (ref 150–440)
RBC: 3.83 10*6/uL — ABNORMAL LOW (ref 4.40–5.90)
RDW: 13.4 % (ref 11.5–14.5)
WBC: 7.6 x10 3/mm (ref 3.8–10.6)

## 2012-09-01 LAB — COMPREHENSIVE METABOLIC PANEL
Albumin: 3.7 g/dL (ref 3.4–5.0)
Alkaline Phosphatase: 64 U/L (ref 50–136)
Anion Gap: 10 (ref 7–16)
BUN: 17 mg/dL (ref 7–18)
Bilirubin,Total: 1.1 mg/dL — ABNORMAL HIGH (ref 0.2–1.0)
EGFR (African American): >60
Glucose: 231 mg/dL — ABNORMAL HIGH (ref 65–99)
Potassium: 3.5 mmol/L (ref 3.5–5.1)
Total Protein: 7 g/dL (ref 6.4–8.2)

## 2012-09-12 ENCOUNTER — Ambulatory Visit: Payer: Self-pay | Admitting: Oncology

## 2012-10-06 LAB — BASIC METABOLIC PANEL
BUN: 17 mg/dL (ref 7–18)
Calcium, Total: 9.2 mg/dL (ref 8.5–10.1)
Chloride: 106 mmol/L (ref 98–107)
EGFR (African American): >60
EGFR (Non-African Amer.): 53 — ABNORMAL LOW
Glucose: 137 mg/dL — ABNORMAL HIGH (ref 65–99)
Osmolality: 291 (ref 275–301)
Potassium: 4 mmol/L (ref 3.5–5.1)

## 2012-10-06 LAB — CBC CANCER CENTER
Basophil %: 0.7 %
HGB: 13.5 g/dL (ref 13.0–18.0)
Lymphocyte #: 1.4 x10 3/mm (ref 1.0–3.6)
Lymphocyte %: 19.7 %
MCH: 32.4 pg (ref 26.0–34.0)
MCV: 95 fL (ref 80–100)
Monocyte #: 0.4 x10 3/mm (ref 0.2–1.0)
Neutrophil %: 71.6 %
Platelet: 152 x10 3/mm (ref 150–440)
RBC: 4.15 10*6/uL — ABNORMAL LOW (ref 4.40–5.90)
RDW: 13.4 % (ref 11.5–14.5)
WBC: 7 x10 3/mm (ref 3.8–10.6)

## 2012-10-09 LAB — URINE IEP, RANDOM

## 2012-10-12 ENCOUNTER — Ambulatory Visit: Payer: Self-pay | Admitting: Oncology

## 2012-11-11 ENCOUNTER — Emergency Department: Payer: Self-pay | Admitting: Emergency Medicine

## 2012-11-12 ENCOUNTER — Ambulatory Visit: Payer: Self-pay | Admitting: Oncology

## 2012-11-17 LAB — COMPREHENSIVE METABOLIC PANEL
Anion Gap: 8 (ref 7–16)
BUN: 15 mg/dL (ref 7–18)
Bilirubin,Total: 0.7 mg/dL (ref 0.2–1.0)
Calcium, Total: 8.6 mg/dL (ref 8.5–10.1)
Chloride: 106 mmol/L (ref 98–107)
Co2: 27 mmol/L (ref 21–32)
Creatinine: 1.05 mg/dL (ref 0.60–1.30)
EGFR (African American): >60
EGFR (Non-African Amer.): >60
Glucose: 116 mg/dL — ABNORMAL HIGH (ref 65–99)
Potassium: 3.6 mmol/L (ref 3.5–5.1)
SGOT(AST): 17 U/L (ref 15–37)
Sodium: 141 mmol/L (ref 136–145)
Total Protein: 7.2 g/dL (ref 6.4–8.2)

## 2012-11-17 LAB — CBC CANCER CENTER
Basophil #: 0.1 x10 3/mm (ref 0.0–0.1)
Basophil %: 1.3 %
HGB: 13.7 g/dL (ref 13.0–18.0)
Lymphocyte %: 18.6 %
MCH: 32.5 pg (ref 26.0–34.0)
MCHC: 35 g/dL (ref 32.0–36.0)
MCV: 93 fL (ref 80–100)
Monocyte #: 0.3 x10 3/mm (ref 0.2–1.0)
Neutrophil #: 4 x10 3/mm (ref 1.4–6.5)
Platelet: 159 x10 3/mm (ref 150–440)
RBC: 4.21 10*6/uL — ABNORMAL LOW (ref 4.40–5.90)

## 2012-11-20 LAB — URINE IEP, RANDOM

## 2012-11-21 LAB — PROT IMMUNOELECTROPHORES(ARMC)

## 2012-12-12 ENCOUNTER — Ambulatory Visit: Payer: Self-pay | Admitting: Oncology

## 2012-12-29 LAB — CBC CANCER CENTER
Basophil #: 0.1 x10 3/mm (ref 0.0–0.1)
Eosinophil #: 0.2 x10 3/mm (ref 0.0–0.7)
Eosinophil %: 3.6 %
HCT: 37.2 % — ABNORMAL LOW (ref 40.0–52.0)
Monocyte #: 0.3 x10 3/mm (ref 0.2–1.0)
Neutrophil %: 70.6 %
Platelet: 158 x10 3/mm (ref 150–440)
RBC: 4.09 10*6/uL — ABNORMAL LOW (ref 4.40–5.90)
RDW: 14.1 % (ref 11.5–14.5)
WBC: 5.2 x10 3/mm (ref 3.8–10.6)

## 2012-12-29 LAB — BASIC METABOLIC PANEL
BUN: 20 mg/dL — ABNORMAL HIGH (ref 7–18)
Calcium, Total: 8.5 mg/dL (ref 8.5–10.1)
Co2: 26 mmol/L (ref 21–32)
EGFR (African American): >60
Glucose: 136 mg/dL — ABNORMAL HIGH (ref 65–99)
Sodium: 139 mmol/L (ref 136–145)

## 2013-01-01 LAB — URINE IEP, RANDOM

## 2013-01-12 ENCOUNTER — Ambulatory Visit: Payer: Self-pay | Admitting: Oncology

## 2013-02-12 ENCOUNTER — Ambulatory Visit: Payer: Self-pay | Admitting: Oncology

## 2013-03-11 ENCOUNTER — Emergency Department: Payer: Self-pay | Admitting: Emergency Medicine

## 2013-03-11 LAB — CBC
HGB: 12.5 g/dL — ABNORMAL LOW (ref 13.0–18.0)
MCH: 32.1 pg (ref 26.0–34.0)
MCV: 91 fL (ref 80–100)
RDW: 15.4 % — ABNORMAL HIGH (ref 11.5–14.5)

## 2013-03-11 LAB — COMPREHENSIVE METABOLIC PANEL
Albumin: 3.5 g/dL (ref 3.4–5.0)
Alkaline Phosphatase: 71 U/L (ref 50–136)
Anion Gap: 4 — ABNORMAL LOW (ref 7–16)
BUN: 13 mg/dL (ref 7–18)
Bilirubin,Total: 1 mg/dL (ref 0.2–1.0)
Chloride: 109 mmol/L — ABNORMAL HIGH (ref 98–107)
Co2: 28 mmol/L (ref 21–32)
Creatinine: 0.92 mg/dL (ref 0.60–1.30)
EGFR (African American): >60
EGFR (Non-African Amer.): >60
Osmolality: 282 (ref 275–301)
Potassium: 3 mmol/L — ABNORMAL LOW (ref 3.5–5.1)
SGOT(AST): 26 U/L (ref 15–37)
SGPT (ALT): 24 U/L (ref 12–78)
Total Protein: 7 g/dL (ref 6.4–8.2)

## 2013-03-11 LAB — URINALYSIS, COMPLETE
Bilirubin,UR: NEGATIVE
Ketone: NEGATIVE
Nitrite: NEGATIVE
Ph: 6 (ref 4.5–8.0)
Protein: NEGATIVE
Squamous Epithelial: NONE SEEN

## 2013-03-11 LAB — PROTIME-INR: Prothrombin Time: 26 secs — ABNORMAL HIGH (ref 11.5–14.7)

## 2013-05-01 ENCOUNTER — Ambulatory Visit: Payer: Self-pay | Admitting: Oncology

## 2013-05-01 LAB — BASIC METABOLIC PANEL
Anion Gap: 10 (ref 7–16)
BUN: 18 mg/dL (ref 7–18)
Chloride: 107 mmol/L (ref 98–107)
Co2: 24 mmol/L (ref 21–32)
EGFR (African American): >60
EGFR (Non-African Amer.): >60
Glucose: 104 mg/dL — ABNORMAL HIGH (ref 65–99)
Potassium: 3.8 mmol/L (ref 3.5–5.1)
Sodium: 141 mmol/L (ref 136–145)

## 2013-05-01 LAB — CBC CANCER CENTER
Basophil #: 0 x10 3/mm (ref 0.0–0.1)
Basophil %: 0.9 %
Eosinophil %: 2.2 %
HCT: 37.2 % — ABNORMAL LOW (ref 40.0–52.0)
HGB: 12.7 g/dL — ABNORMAL LOW (ref 13.0–18.0)
Lymphocyte #: 1.4 x10 3/mm (ref 1.0–3.6)
Lymphocyte %: 26.3 %
MCHC: 34 g/dL (ref 32.0–36.0)
Monocyte #: 0.1 x10 3/mm — ABNORMAL LOW (ref 0.2–1.0)
Neutrophil %: 68.6 %
RDW: 14.3 % (ref 11.5–14.5)
WBC: 5.2 x10 3/mm (ref 3.8–10.6)

## 2013-05-02 LAB — PROT IMMUNOELECTROPHORES(ARMC)

## 2013-05-02 LAB — URINE IEP, RANDOM

## 2013-05-14 ENCOUNTER — Ambulatory Visit: Payer: Self-pay | Admitting: Oncology

## 2013-09-03 ENCOUNTER — Ambulatory Visit: Payer: Self-pay | Admitting: Oncology

## 2013-09-04 LAB — BASIC METABOLIC PANEL
Anion Gap: 11 (ref 7–16)
BUN: 13 mg/dL (ref 7–18)
CO2: 26 mmol/L (ref 21–32)
CREATININE: 1.23 mg/dL (ref 0.60–1.30)
Calcium, Total: 8.6 mg/dL (ref 8.5–10.1)
Chloride: 102 mmol/L (ref 98–107)
EGFR (African American): >60
GFR CALC NON AF AMER: 57 — AB
GLUCOSE: 228 mg/dL — AB (ref 65–99)
OSMOLALITY: 285 (ref 275–301)
Potassium: 3.3 mmol/L — ABNORMAL LOW (ref 3.5–5.1)
SODIUM: 139 mmol/L (ref 136–145)

## 2013-09-04 LAB — CBC CANCER CENTER
BASOS ABS: 0 x10 3/mm (ref 0.0–0.1)
Basophil %: 0.5 %
Eosinophil #: 0.1 x10 3/mm (ref 0.0–0.7)
Eosinophil %: 2.8 %
HCT: 34.7 % — AB (ref 40.0–52.0)
HGB: 11.7 g/dL — ABNORMAL LOW (ref 13.0–18.0)
Lymphocyte #: 1.8 x10 3/mm (ref 1.0–3.6)
Lymphocyte %: 39.2 %
MCH: 32.5 pg (ref 26.0–34.0)
MCHC: 33.6 g/dL (ref 32.0–36.0)
MCV: 97 fL (ref 80–100)
Monocyte #: 0.1 x10 3/mm — ABNORMAL LOW (ref 0.2–1.0)
Monocyte %: 2.8 %
NEUTROS PCT: 54.7 %
Neutrophil #: 2.5 x10 3/mm (ref 1.4–6.5)
Platelet: 106 x10 3/mm — ABNORMAL LOW (ref 150–440)
RBC: 3.59 10*6/uL — ABNORMAL LOW (ref 4.40–5.90)
RDW: 14.9 % — AB (ref 11.5–14.5)
WBC: 4.6 x10 3/mm (ref 3.8–10.6)

## 2013-09-05 LAB — PROT IMMUNOELECTROPHORES(ARMC)

## 2013-09-05 LAB — KAPPA/LAMBDA FREE LIGHT CHAINS (ARMC)

## 2013-09-06 LAB — URINE IEP, RANDOM

## 2013-09-12 ENCOUNTER — Ambulatory Visit: Payer: Self-pay | Admitting: Oncology

## 2013-10-12 ENCOUNTER — Ambulatory Visit: Payer: Self-pay | Admitting: Oncology

## 2013-10-24 ENCOUNTER — Ambulatory Visit: Payer: Self-pay | Admitting: Oncology

## 2013-10-24 LAB — CBC CANCER CENTER
BASOS ABS: 0 x10 3/mm (ref 0.0–0.1)
Basophil %: 0.6 %
EOS PCT: 2.5 %
Eosinophil #: 0.1 x10 3/mm (ref 0.0–0.7)
HCT: 30.3 % — AB (ref 40.0–52.0)
HGB: 10.2 g/dL — AB (ref 13.0–18.0)
Lymphocyte #: 2.7 x10 3/mm (ref 1.0–3.6)
Lymphocyte %: 60.5 %
MCH: 32.4 pg (ref 26.0–34.0)
MCHC: 33.6 g/dL (ref 32.0–36.0)
MCV: 97 fL (ref 80–100)
MONO ABS: 0.2 x10 3/mm (ref 0.2–1.0)
Monocyte %: 5.3 %
Neutrophil #: 1.4 x10 3/mm (ref 1.4–6.5)
Neutrophil %: 31.1 %
Platelet: 46 x10 3/mm — ABNORMAL LOW (ref 150–440)
RBC: 3.13 10*6/uL — ABNORMAL LOW (ref 4.40–5.90)
RDW: 15.5 % — AB (ref 11.5–14.5)
WBC: 4.5 x10 3/mm (ref 3.8–10.6)

## 2013-11-06 LAB — CBC CANCER CENTER
BASOS ABS: 0 x10 3/mm (ref 0.0–0.1)
Basophil %: 0.7 %
Eosinophil #: 0.1 x10 3/mm (ref 0.0–0.7)
Eosinophil %: 2 %
HCT: 30.1 % — ABNORMAL LOW (ref 40.0–52.0)
HGB: 10.4 g/dL — ABNORMAL LOW (ref 13.0–18.0)
LYMPHS ABS: 2.6 x10 3/mm (ref 1.0–3.6)
LYMPHS PCT: 54.8 %
MCH: 33 pg (ref 26.0–34.0)
MCHC: 34.5 g/dL (ref 32.0–36.0)
MCV: 96 fL (ref 80–100)
Monocyte #: 0.2 x10 3/mm (ref 0.2–1.0)
Monocyte %: 5.3 %
NEUTROS ABS: 1.8 x10 3/mm (ref 1.4–6.5)
Neutrophil %: 37.2 %
PLATELETS: 48 x10 3/mm — AB (ref 150–440)
RBC: 3.14 10*6/uL — ABNORMAL LOW (ref 4.40–5.90)
RDW: 16 % — AB (ref 11.5–14.5)
WBC: 4.7 x10 3/mm (ref 3.8–10.6)

## 2013-11-06 LAB — BASIC METABOLIC PANEL
ANION GAP: 6 — AB (ref 7–16)
BUN: 19 mg/dL — ABNORMAL HIGH (ref 7–18)
CALCIUM: 8.4 mg/dL — AB (ref 8.5–10.1)
Chloride: 102 mmol/L (ref 98–107)
Co2: 28 mmol/L (ref 21–32)
Creatinine: 1.36 mg/dL — ABNORMAL HIGH (ref 0.60–1.30)
EGFR (Non-African Amer.): 51 — ABNORMAL LOW
GFR CALC AF AMER: 59 — AB
Glucose: 232 mg/dL — ABNORMAL HIGH (ref 65–99)
OSMOLALITY: 282 (ref 275–301)
POTASSIUM: 4 mmol/L (ref 3.5–5.1)
Sodium: 136 mmol/L (ref 136–145)

## 2013-11-08 LAB — PROT IMMUNOELECTROPHORES(ARMC)

## 2013-11-12 ENCOUNTER — Ambulatory Visit: Payer: Self-pay | Admitting: Oncology

## 2013-11-13 LAB — COMPREHENSIVE METABOLIC PANEL
ALBUMIN: 3.5 g/dL (ref 3.4–5.0)
ALT: 28 U/L (ref 12–78)
Alkaline Phosphatase: 73 U/L
Anion Gap: 6 — ABNORMAL LOW (ref 7–16)
BILIRUBIN TOTAL: 0.7 mg/dL (ref 0.2–1.0)
BUN: 21 mg/dL — ABNORMAL HIGH (ref 7–18)
CALCIUM: 8.7 mg/dL (ref 8.5–10.1)
CREATININE: 1.26 mg/dL (ref 0.60–1.30)
Chloride: 103 mmol/L (ref 98–107)
Co2: 26 mmol/L (ref 21–32)
EGFR (African American): >60
GFR CALC NON AF AMER: 55 — AB
GLUCOSE: 104 mg/dL — AB (ref 65–99)
OSMOLALITY: 273 (ref 275–301)
Potassium: 4.2 mmol/L (ref 3.5–5.1)
SGOT(AST): 23 U/L (ref 15–37)
SODIUM: 135 mmol/L — AB (ref 136–145)
Total Protein: 11.1 g/dL — ABNORMAL HIGH (ref 6.4–8.2)

## 2013-11-13 LAB — CBC CANCER CENTER
BASOS PCT: 1.3 %
Basophil #: 0 x10 3/mm (ref 0.0–0.1)
EOS ABS: 0.1 x10 3/mm (ref 0.0–0.7)
EOS PCT: 2.9 %
HCT: 30.7 % — ABNORMAL LOW (ref 40.0–52.0)
HGB: 10.4 g/dL — AB (ref 13.0–18.0)
Lymphocyte #: 1.9 x10 3/mm (ref 1.0–3.6)
Lymphocyte %: 55.2 %
MCH: 32.5 pg (ref 26.0–34.0)
MCHC: 33.8 g/dL (ref 32.0–36.0)
MCV: 96 fL (ref 80–100)
MONO ABS: 0.2 x10 3/mm (ref 0.2–1.0)
Monocyte %: 4.7 %
Neutrophil #: 1.3 x10 3/mm — ABNORMAL LOW (ref 1.4–6.5)
Neutrophil %: 35.9 %
Platelet: 38 x10 3/mm — ABNORMAL LOW (ref 150–440)
RBC: 3.2 10*6/uL — ABNORMAL LOW (ref 4.40–5.90)
RDW: 16.2 % — AB (ref 11.5–14.5)
WBC: 3.5 x10 3/mm — ABNORMAL LOW (ref 3.8–10.6)

## 2013-11-13 LAB — PROTIME-INR
INR: 2.3
PROTHROMBIN TIME: 24.3 s — AB (ref 11.5–14.7)

## 2013-11-13 LAB — APTT: Activated PTT: 43.5 secs — ABNORMAL HIGH (ref 23.6–35.9)

## 2013-11-28 ENCOUNTER — Observation Stay: Payer: Self-pay | Admitting: Internal Medicine

## 2013-11-28 DIAGNOSIS — Z7901 Long term (current) use of anticoagulants: Secondary | ICD-10-CM

## 2013-11-28 DIAGNOSIS — I4891 Unspecified atrial fibrillation: Secondary | ICD-10-CM

## 2013-11-28 DIAGNOSIS — I369 Nonrheumatic tricuspid valve disorder, unspecified: Secondary | ICD-10-CM

## 2013-11-28 LAB — CBC
HCT: 27.9 % — ABNORMAL LOW (ref 40.0–52.0)
HGB: 9.4 g/dL — ABNORMAL LOW (ref 13.0–18.0)
MCH: 32.9 pg (ref 26.0–34.0)
MCHC: 33.9 g/dL (ref 32.0–36.0)
MCV: 97 fL (ref 80–100)
PLATELETS: 51 10*3/uL — AB (ref 150–440)
RBC: 2.87 10*6/uL — ABNORMAL LOW (ref 4.40–5.90)
RDW: 17.2 % — ABNORMAL HIGH (ref 11.5–14.5)
WBC: 3.5 10*3/uL — ABNORMAL LOW (ref 3.8–10.6)

## 2013-11-28 LAB — COMPREHENSIVE METABOLIC PANEL
ALK PHOS: 75 U/L
Albumin: 3.4 g/dL (ref 3.4–5.0)
Anion Gap: 6 — ABNORMAL LOW (ref 7–16)
BILIRUBIN TOTAL: 1.1 mg/dL — AB (ref 0.2–1.0)
BUN: 17 mg/dL (ref 7–18)
CHLORIDE: 104 mmol/L (ref 98–107)
Calcium, Total: 8.1 mg/dL — ABNORMAL LOW (ref 8.5–10.1)
Co2: 24 mmol/L (ref 21–32)
Creatinine: 1.27 mg/dL (ref 0.60–1.30)
EGFR (African American): >60
GFR CALC NON AF AMER: 55 — AB
Glucose: 130 mg/dL — ABNORMAL HIGH (ref 65–99)
OSMOLALITY: 272 (ref 275–301)
POTASSIUM: 3.7 mmol/L (ref 3.5–5.1)
SGOT(AST): 28 U/L (ref 15–37)
SGPT (ALT): 34 U/L (ref 12–78)
Sodium: 134 mmol/L — ABNORMAL LOW (ref 136–145)
TOTAL PROTEIN: 9.7 g/dL — AB (ref 6.4–8.2)

## 2013-11-28 LAB — TROPONIN I
Troponin-I: <0.02 ng/mL
Troponin-I: <0.02 ng/mL

## 2013-11-28 LAB — PROTIME-INR
INR: 2.1
Prothrombin Time: 23.4 secs — ABNORMAL HIGH (ref 11.5–14.7)

## 2013-11-28 LAB — CK TOTAL AND CKMB (NOT AT ARMC)
CK, TOTAL: 129 U/L
CK-MB: 1.1 ng/mL (ref 0.5–3.6)

## 2013-11-28 LAB — MAGNESIUM: MAGNESIUM: 2.1 mg/dL

## 2013-11-29 DIAGNOSIS — R0602 Shortness of breath: Secondary | ICD-10-CM

## 2013-11-29 DIAGNOSIS — R079 Chest pain, unspecified: Secondary | ICD-10-CM

## 2013-11-29 DIAGNOSIS — I10 Essential (primary) hypertension: Secondary | ICD-10-CM

## 2013-11-29 LAB — CBC WITH DIFFERENTIAL/PLATELET
BASOS ABS: 0 10*3/uL (ref 0.0–0.1)
Basophil %: 0.3 %
EOS ABS: 0.1 10*3/uL (ref 0.0–0.7)
EOS PCT: 2.6 %
HCT: 27.1 % — AB (ref 40.0–52.0)
HGB: 9.2 g/dL — ABNORMAL LOW (ref 13.0–18.0)
LYMPHS PCT: 48.9 %
Lymphocyte #: 1.6 10*3/uL (ref 1.0–3.6)
MCH: 32.5 pg (ref 26.0–34.0)
MCHC: 33.8 g/dL (ref 32.0–36.0)
MCV: 96 fL (ref 80–100)
MONO ABS: 0.2 x10 3/mm (ref 0.2–1.0)
Monocyte %: 6.4 %
NEUTROS PCT: 41.8 %
Neutrophil #: 1.3 10*3/uL — ABNORMAL LOW (ref 1.4–6.5)
Platelet: 43 10*3/uL — ABNORMAL LOW (ref 150–440)
RBC: 2.82 10*6/uL — AB (ref 4.40–5.90)
RDW: 17.7 % — ABNORMAL HIGH (ref 11.5–14.5)
WBC: 3.2 10*3/uL — AB (ref 3.8–10.6)

## 2013-11-29 LAB — BASIC METABOLIC PANEL
Anion Gap: 5 — ABNORMAL LOW (ref 7–16)
BUN: 15 mg/dL (ref 7–18)
CHLORIDE: 105 mmol/L (ref 98–107)
CO2: 25 mmol/L (ref 21–32)
Calcium, Total: 8.5 mg/dL (ref 8.5–10.1)
Creatinine: 1.15 mg/dL (ref 0.60–1.30)
EGFR (African American): >60
EGFR (Non-African Amer.): >60
Glucose: 126 mg/dL — ABNORMAL HIGH (ref 65–99)
OSMOLALITY: 272 (ref 275–301)
Potassium: 4.3 mmol/L (ref 3.5–5.1)
Sodium: 135 mmol/L — ABNORMAL LOW (ref 136–145)

## 2013-11-29 LAB — PROTIME-INR
INR: 2.5
Prothrombin Time: 26.7 secs — ABNORMAL HIGH (ref 11.5–14.7)

## 2013-11-30 ENCOUNTER — Encounter: Payer: Self-pay | Admitting: Cardiovascular Disease

## 2013-12-03 ENCOUNTER — Telehealth: Payer: Self-pay

## 2013-12-03 NOTE — Telephone Encounter (Signed)
l mom, pt needs f/u appt with Dr Rockey Situ, stress test performed in hospital.

## 2013-12-07 ENCOUNTER — Telehealth: Payer: Self-pay | Admitting: *Deleted

## 2013-12-10 LAB — CBC CANCER CENTER
Basophil #: 0 x10 3/mm (ref 0.0–0.1)
Basophil %: 0.9 %
Eosinophil #: 0 x10 3/mm (ref 0.0–0.7)
Eosinophil %: 1.1 %
HCT: 26.5 % — AB (ref 40.0–52.0)
HGB: 9 g/dL — AB (ref 13.0–18.0)
LYMPHS ABS: 2 x10 3/mm (ref 1.0–3.6)
LYMPHS PCT: 49.4 %
MCH: 33.1 pg (ref 26.0–34.0)
MCHC: 33.9 g/dL (ref 32.0–36.0)
MCV: 98 fL (ref 80–100)
Monocyte #: 0.2 x10 3/mm (ref 0.2–1.0)
Monocyte %: 4.4 %
NEUTROS PCT: 44.2 %
Neutrophil #: 1.8 x10 3/mm (ref 1.4–6.5)
Platelet: 60 x10 3/mm — ABNORMAL LOW (ref 150–440)
RBC: 2.72 10*6/uL — ABNORMAL LOW (ref 4.40–5.90)
RDW: 18.5 % — ABNORMAL HIGH (ref 11.5–14.5)
WBC: 4 x10 3/mm (ref 3.8–10.6)

## 2013-12-10 LAB — COMPREHENSIVE METABOLIC PANEL
ALT: 24 U/L (ref 12–78)
Albumin: 2.9 g/dL — ABNORMAL LOW (ref 3.4–5.0)
Alkaline Phosphatase: 72 U/L
Anion Gap: 8 (ref 7–16)
BUN: 14 mg/dL (ref 7–18)
Bilirubin,Total: 1.5 mg/dL — ABNORMAL HIGH (ref 0.2–1.0)
CHLORIDE: 102 mmol/L (ref 98–107)
CO2: 26 mmol/L (ref 21–32)
CREATININE: 1.43 mg/dL — AB (ref 0.60–1.30)
Calcium, Total: 8.1 mg/dL — ABNORMAL LOW (ref 8.5–10.1)
EGFR (African American): 55 — ABNORMAL LOW
EGFR (Non-African Amer.): 48 — ABNORMAL LOW
Glucose: 215 mg/dL — ABNORMAL HIGH (ref 65–99)
Osmolality: 279 (ref 275–301)
POTASSIUM: 3.8 mmol/L (ref 3.5–5.1)
SGOT(AST): 18 U/L (ref 15–37)
Sodium: 136 mmol/L (ref 136–145)
TOTAL PROTEIN: 9.8 g/dL — AB (ref 6.4–8.2)

## 2013-12-12 ENCOUNTER — Ambulatory Visit: Payer: Self-pay | Admitting: Oncology

## 2013-12-12 ENCOUNTER — Other Ambulatory Visit: Payer: Self-pay

## 2013-12-17 ENCOUNTER — Observation Stay: Payer: Self-pay | Admitting: Internal Medicine

## 2013-12-17 LAB — COMPREHENSIVE METABOLIC PANEL
ALBUMIN: 2.8 g/dL — AB (ref 3.4–5.0)
ALK PHOS: 68 U/L
ANION GAP: 8 (ref 7–16)
BUN: 21 mg/dL — ABNORMAL HIGH (ref 7–18)
Bilirubin,Total: 0.9 mg/dL (ref 0.2–1.0)
CHLORIDE: 103 mmol/L (ref 98–107)
Calcium, Total: 8.2 mg/dL — ABNORMAL LOW (ref 8.5–10.1)
Co2: 23 mmol/L (ref 21–32)
Creatinine: 1.62 mg/dL — ABNORMAL HIGH (ref 0.60–1.30)
EGFR (African American): 47 — ABNORMAL LOW
EGFR (Non-African Amer.): 41 — ABNORMAL LOW
Glucose: 103 mg/dL — ABNORMAL HIGH (ref 65–99)
Osmolality: 271 (ref 275–301)
Potassium: 3.9 mmol/L (ref 3.5–5.1)
SGOT(AST): 16 U/L (ref 15–37)
SGPT (ALT): 17 U/L (ref 12–78)
SODIUM: 134 mmol/L — AB (ref 136–145)
Total Protein: 10.2 g/dL — ABNORMAL HIGH (ref 6.4–8.2)

## 2013-12-17 LAB — CBC
HCT: 26 % — ABNORMAL LOW (ref 40.0–52.0)
HGB: 8.8 g/dL — AB (ref 13.0–18.0)
MCH: 32.9 pg (ref 26.0–34.0)
MCHC: 34 g/dL (ref 32.0–36.0)
MCV: 97 fL (ref 80–100)
Platelet: 116 10*3/uL — ABNORMAL LOW (ref 150–440)
RBC: 2.68 10*6/uL — ABNORMAL LOW (ref 4.40–5.90)
RDW: 17.9 % — ABNORMAL HIGH (ref 11.5–14.5)
WBC: 5.7 10*3/uL (ref 3.8–10.6)

## 2013-12-17 LAB — URINALYSIS, COMPLETE
BILIRUBIN, UR: NEGATIVE
Blood: NEGATIVE
Glucose,UR: NEGATIVE mg/dL (ref 0–75)
Ketone: NEGATIVE
Leukocyte Esterase: NEGATIVE
Nitrite: NEGATIVE
Ph: 5 (ref 4.5–8.0)
Protein: 30
RBC,UR: 2 /HPF (ref 0–5)
Specific Gravity: 1.017 (ref 1.003–1.030)
Squamous Epithelial: NONE SEEN

## 2013-12-17 LAB — D-DIMER(ARMC): D-Dimer: 3568 ng/ml

## 2013-12-17 LAB — CK TOTAL AND CKMB (NOT AT ARMC)
CK, Total: 60 U/L
CK-MB: <0.5 ng/mL — ABNORMAL LOW (ref 0.5–3.6)

## 2013-12-17 LAB — TROPONIN I: Troponin-I: <0.02 ng/mL

## 2013-12-17 LAB — CK-MB: CK-MB: 1 ng/mL (ref 0.5–3.6)

## 2013-12-18 LAB — CBC WITH DIFFERENTIAL/PLATELET
BASOS PCT: 0.7 %
Basophil #: 0 10*3/uL (ref 0.0–0.1)
EOS PCT: 2.4 %
Eosinophil #: 0.1 10*3/uL (ref 0.0–0.7)
HCT: 23.6 % — AB (ref 40.0–52.0)
HGB: 8 g/dL — AB (ref 13.0–18.0)
LYMPHS ABS: 1.5 10*3/uL (ref 1.0–3.6)
Lymphocyte %: 30.9 %
MCH: 32.7 pg (ref 26.0–34.0)
MCHC: 34 g/dL (ref 32.0–36.0)
MCV: 96 fL (ref 80–100)
MONO ABS: 0.2 x10 3/mm (ref 0.2–1.0)
MONOS PCT: 4 %
Neutrophil #: 3.1 10*3/uL (ref 1.4–6.5)
Neutrophil %: 62 %
Platelet: 102 10*3/uL — ABNORMAL LOW (ref 150–440)
RBC: 2.45 10*6/uL — ABNORMAL LOW (ref 4.40–5.90)
RDW: 18.4 % — AB (ref 11.5–14.5)
WBC: 4.9 10*3/uL (ref 3.8–10.6)

## 2013-12-18 LAB — CK TOTAL AND CKMB (NOT AT ARMC)
CK, Total: 69 U/L
CK-MB: 0.7 ng/mL (ref 0.5–3.6)

## 2013-12-18 LAB — COMPREHENSIVE METABOLIC PANEL
AST: 16 U/L (ref 15–37)
Albumin: 2.5 g/dL — ABNORMAL LOW (ref 3.4–5.0)
Alkaline Phosphatase: 56 U/L
Anion Gap: 7 (ref 7–16)
BUN: 22 mg/dL — AB (ref 7–18)
Bilirubin,Total: 0.9 mg/dL (ref 0.2–1.0)
CALCIUM: 7.9 mg/dL — AB (ref 8.5–10.1)
Chloride: 104 mmol/L (ref 98–107)
Co2: 23 mmol/L (ref 21–32)
Creatinine: 1.52 mg/dL — ABNORMAL HIGH (ref 0.60–1.30)
EGFR (African American): 51 — ABNORMAL LOW
EGFR (Non-African Amer.): 44 — ABNORMAL LOW
Glucose: 116 mg/dL — ABNORMAL HIGH (ref 65–99)
OSMOLALITY: 273 (ref 275–301)
Potassium: 3.5 mmol/L (ref 3.5–5.1)
SGPT (ALT): 12 U/L (ref 12–78)
SODIUM: 134 mmol/L — AB (ref 136–145)
Total Protein: 8.7 g/dL — ABNORMAL HIGH (ref 6.4–8.2)

## 2013-12-18 LAB — TROPONIN I: Troponin-I: <0.02 ng/mL

## 2013-12-20 ENCOUNTER — Ambulatory Visit: Payer: Medicare Other | Admitting: Cardiovascular Disease

## 2013-12-22 LAB — EXPECTORATED SPUTUM ASSESSMENT W GRAM STAIN, RFLX TO RESP C

## 2013-12-24 LAB — BASIC METABOLIC PANEL
Anion Gap: 12 (ref 7–16)
BUN: 22 mg/dL — ABNORMAL HIGH (ref 7–18)
CALCIUM: 8.6 mg/dL (ref 8.5–10.1)
CHLORIDE: 104 mmol/L (ref 98–107)
Co2: 21 mmol/L (ref 21–32)
Creatinine: 1.51 mg/dL — ABNORMAL HIGH (ref 0.60–1.30)
EGFR (African American): 52 — ABNORMAL LOW
GFR CALC NON AF AMER: 45 — AB
Glucose: 170 mg/dL — ABNORMAL HIGH (ref 65–99)
Osmolality: 281 (ref 275–301)
Potassium: 4.4 mmol/L (ref 3.5–5.1)
Sodium: 137 mmol/L (ref 136–145)

## 2013-12-24 LAB — CBC CANCER CENTER
BASOS PCT: 0.9 %
Basophil #: 0 x10 3/mm (ref 0.0–0.1)
Eosinophil #: 0.1 x10 3/mm (ref 0.0–0.7)
Eosinophil %: 3.2 %
HCT: 27 % — ABNORMAL LOW (ref 40.0–52.0)
HGB: 9.1 g/dL — ABNORMAL LOW (ref 13.0–18.0)
LYMPHS ABS: 2.1 x10 3/mm (ref 1.0–3.6)
Lymphocyte %: 47.6 %
MCH: 32.8 pg (ref 26.0–34.0)
MCHC: 33.6 g/dL (ref 32.0–36.0)
MCV: 98 fL (ref 80–100)
MONOS PCT: 5.5 %
Monocyte #: 0.2 x10 3/mm (ref 0.2–1.0)
Neutrophil #: 1.9 x10 3/mm (ref 1.4–6.5)
Neutrophil %: 42.8 %
Platelet: 94 x10 3/mm — ABNORMAL LOW (ref 150–440)
RBC: 2.77 10*6/uL — ABNORMAL LOW (ref 4.40–5.90)
RDW: 18.3 % — ABNORMAL HIGH (ref 11.5–14.5)
WBC: 4.4 x10 3/mm (ref 3.8–10.6)

## 2013-12-26 LAB — URINE IEP, RANDOM

## 2013-12-27 LAB — CBC CANCER CENTER
Basophil #: 0.1 x10 3/mm (ref 0.0–0.1)
Basophil %: 1.8 %
Eosinophil #: 0.2 x10 3/mm (ref 0.0–0.7)
Eosinophil %: 3.8 %
HCT: 25.8 % — AB (ref 40.0–52.0)
HGB: 8.7 g/dL — ABNORMAL LOW (ref 13.0–18.0)
LYMPHS ABS: 1.6 x10 3/mm (ref 1.0–3.6)
Lymphocyte %: 39.7 %
MCH: 33 pg (ref 26.0–34.0)
MCHC: 33.8 g/dL (ref 32.0–36.0)
MCV: 98 fL (ref 80–100)
Monocyte #: 0.2 x10 3/mm (ref 0.2–1.0)
Monocyte %: 4.6 %
NEUTROS PCT: 50.1 %
Neutrophil #: 2 x10 3/mm (ref 1.4–6.5)
Platelet: 83 x10 3/mm — ABNORMAL LOW (ref 150–440)
RBC: 2.65 10*6/uL — AB (ref 4.40–5.90)
RDW: 18.4 % — AB (ref 11.5–14.5)
WBC: 4 x10 3/mm (ref 3.8–10.6)

## 2013-12-27 LAB — COMPREHENSIVE METABOLIC PANEL
ALK PHOS: 74 U/L
Albumin: 3 g/dL — ABNORMAL LOW (ref 3.4–5.0)
Anion Gap: 9 (ref 7–16)
BUN: 15 mg/dL (ref 7–18)
Bilirubin,Total: 0.7 mg/dL (ref 0.2–1.0)
CHLORIDE: 103 mmol/L (ref 98–107)
Calcium, Total: 8.6 mg/dL (ref 8.5–10.1)
Co2: 24 mmol/L (ref 21–32)
Creatinine: 1.27 mg/dL (ref 0.60–1.30)
EGFR (Non-African Amer.): 55 — ABNORMAL LOW
GLUCOSE: 192 mg/dL — AB (ref 65–99)
OSMOLALITY: 278 (ref 275–301)
Potassium: 3.8 mmol/L (ref 3.5–5.1)
SGOT(AST): 12 U/L — ABNORMAL LOW (ref 15–37)
SGPT (ALT): 17 U/L (ref 12–78)
SODIUM: 136 mmol/L (ref 136–145)
TOTAL PROTEIN: 9.6 g/dL — AB (ref 6.4–8.2)

## 2013-12-27 LAB — PROTIME-INR
INR: 1.1
PROTHROMBIN TIME: 13.8 s (ref 11.5–14.7)

## 2013-12-31 ENCOUNTER — Ambulatory Visit: Payer: Self-pay | Admitting: Vascular Surgery

## 2013-12-31 LAB — KAPPA/LAMBDA FREE LIGHT CHAINS (ARMC)

## 2013-12-31 LAB — PROT IMMUNOELECTROPHORES(ARMC)

## 2014-01-10 LAB — COMPREHENSIVE METABOLIC PANEL
Albumin: 2.9 g/dL — ABNORMAL LOW (ref 3.4–5.0)
Alkaline Phosphatase: 67 U/L
Anion Gap: 8 (ref 7–16)
BILIRUBIN TOTAL: 1 mg/dL (ref 0.2–1.0)
BUN: 25 mg/dL — AB (ref 7–18)
CALCIUM: 8.3 mg/dL — AB (ref 8.5–10.1)
Chloride: 102 mmol/L (ref 98–107)
Co2: 22 mmol/L (ref 21–32)
Creatinine: 1.25 mg/dL (ref 0.60–1.30)
EGFR (African American): >60
EGFR (Non-African Amer.): 56 — ABNORMAL LOW
GLUCOSE: 354 mg/dL — AB (ref 65–99)
OSMOLALITY: 283 (ref 275–301)
Potassium: 3.7 mmol/L (ref 3.5–5.1)
SGOT(AST): 14 U/L — ABNORMAL LOW (ref 15–37)
SGPT (ALT): 22 U/L
Sodium: 132 mmol/L — ABNORMAL LOW (ref 136–145)
TOTAL PROTEIN: 9.5 g/dL — AB (ref 6.4–8.2)

## 2014-01-10 LAB — CBC CANCER CENTER
BASOS PCT: 0.2 %
Basophil #: 0 x10 3/mm (ref 0.0–0.1)
Eosinophil #: 0 x10 3/mm (ref 0.0–0.7)
Eosinophil %: 0.3 %
HCT: 25.5 % — AB (ref 40.0–52.0)
HGB: 8.6 g/dL — ABNORMAL LOW (ref 13.0–18.0)
LYMPHS PCT: 39 %
Lymphocyte #: 1.8 x10 3/mm (ref 1.0–3.6)
MCH: 34.2 pg — ABNORMAL HIGH (ref 26.0–34.0)
MCHC: 33.6 g/dL (ref 32.0–36.0)
MCV: 102 fL — AB (ref 80–100)
MONO ABS: 0.2 x10 3/mm (ref 0.2–1.0)
MONOS PCT: 3.7 %
Neutrophil #: 2.6 x10 3/mm (ref 1.4–6.5)
Neutrophil %: 56.8 %
Platelet: 90 x10 3/mm — ABNORMAL LOW (ref 150–440)
RBC: 2.51 10*6/uL — ABNORMAL LOW (ref 4.40–5.90)
RDW: 21 % — AB (ref 11.5–14.5)
WBC: 4.6 x10 3/mm (ref 3.8–10.6)

## 2014-01-10 LAB — PROTIME-INR
INR: 1.1
PROTHROMBIN TIME: 13.9 s (ref 11.5–14.7)

## 2014-01-12 ENCOUNTER — Ambulatory Visit: Payer: Self-pay | Admitting: Oncology

## 2014-01-14 LAB — URINE IEP, RANDOM

## 2014-01-14 LAB — PROT IMMUNOELECTROPHORES(ARMC)

## 2014-01-17 LAB — CBC CANCER CENTER
Basophil #: 0 x10 3/mm (ref 0.0–0.1)
Basophil %: 0.7 %
EOS ABS: 0.1 x10 3/mm (ref 0.0–0.7)
Eosinophil %: 3 %
HCT: 25.6 % — ABNORMAL LOW (ref 40.0–52.0)
HGB: 8.6 g/dL — ABNORMAL LOW (ref 13.0–18.0)
LYMPHS ABS: 1.4 x10 3/mm (ref 1.0–3.6)
Lymphocyte %: 44.6 %
MCH: 34.3 pg — ABNORMAL HIGH (ref 26.0–34.0)
MCHC: 33.8 g/dL (ref 32.0–36.0)
MCV: 102 fL — ABNORMAL HIGH (ref 80–100)
MONO ABS: 0.1 x10 3/mm — AB (ref 0.2–1.0)
MONOS PCT: 4 %
NEUTROS PCT: 47.7 %
Neutrophil #: 1.4 x10 3/mm (ref 1.4–6.5)
PLATELETS: 91 x10 3/mm — AB (ref 150–440)
RBC: 2.52 10*6/uL — AB (ref 4.40–5.90)
RDW: 21.3 % — AB (ref 11.5–14.5)
WBC: 3 x10 3/mm — ABNORMAL LOW (ref 3.8–10.6)

## 2014-01-17 LAB — BASIC METABOLIC PANEL
ANION GAP: 9 (ref 7–16)
BUN: 14 mg/dL (ref 7–18)
CALCIUM: 8 mg/dL — AB (ref 8.5–10.1)
CO2: 24 mmol/L (ref 21–32)
Chloride: 103 mmol/L (ref 98–107)
Creatinine: 1.32 mg/dL — ABNORMAL HIGH (ref 0.60–1.30)
EGFR (African American): >60
EGFR (Non-African Amer.): 52 — ABNORMAL LOW
Glucose: 175 mg/dL — ABNORMAL HIGH (ref 65–99)
Osmolality: 277 (ref 275–301)
Potassium: 3.5 mmol/L (ref 3.5–5.1)
SODIUM: 136 mmol/L (ref 136–145)

## 2014-01-24 LAB — CBC CANCER CENTER
BASOS ABS: 0 x10 3/mm (ref 0.0–0.1)
BASOS PCT: 0.5 %
Eosinophil #: 0.1 x10 3/mm (ref 0.0–0.7)
Eosinophil %: 2.9 %
HCT: 25.7 % — ABNORMAL LOW (ref 40.0–52.0)
HGB: 8.6 g/dL — ABNORMAL LOW (ref 13.0–18.0)
LYMPHS ABS: 1.4 x10 3/mm (ref 1.0–3.6)
Lymphocyte %: 41.4 %
MCH: 33.9 pg (ref 26.0–34.0)
MCHC: 33.3 g/dL (ref 32.0–36.0)
MCV: 102 fL — AB (ref 80–100)
Monocyte #: 0.2 x10 3/mm (ref 0.2–1.0)
Monocyte %: 6.6 %
Neutrophil #: 1.7 x10 3/mm (ref 1.4–6.5)
Neutrophil %: 48.6 %
Platelet: 124 x10 3/mm — ABNORMAL LOW (ref 150–440)
RBC: 2.53 10*6/uL — ABNORMAL LOW (ref 4.40–5.90)
RDW: 20.5 % — ABNORMAL HIGH (ref 11.5–14.5)
WBC: 3.5 x10 3/mm — ABNORMAL LOW (ref 3.8–10.6)

## 2014-01-24 LAB — BASIC METABOLIC PANEL
ANION GAP: 7 (ref 7–16)
BUN: 15 mg/dL (ref 7–18)
CHLORIDE: 106 mmol/L (ref 98–107)
Calcium, Total: 7.7 mg/dL — ABNORMAL LOW (ref 8.5–10.1)
Co2: 24 mmol/L (ref 21–32)
Creatinine: 1.14 mg/dL (ref 0.60–1.30)
EGFR (Non-African Amer.): >60
GLUCOSE: 206 mg/dL — AB (ref 65–99)
Osmolality: 281 (ref 275–301)
Potassium: 4 mmol/L (ref 3.5–5.1)
Sodium: 137 mmol/L (ref 136–145)

## 2014-01-24 LAB — PROTIME-INR
INR: 1.3
PROTHROMBIN TIME: 15.5 s — AB (ref 11.5–14.7)

## 2014-02-07 LAB — BASIC METABOLIC PANEL
Anion Gap: 11 (ref 7–16)
BUN: 15 mg/dL (ref 7–18)
CHLORIDE: 105 mmol/L (ref 98–107)
CO2: 23 mmol/L (ref 21–32)
Calcium, Total: 8.2 mg/dL — ABNORMAL LOW (ref 8.5–10.1)
Creatinine: 1.2 mg/dL (ref 0.60–1.30)
EGFR (African American): >60
EGFR (Non-African Amer.): 58 — ABNORMAL LOW
Glucose: 204 mg/dL — ABNORMAL HIGH (ref 65–99)
Osmolality: 284 (ref 275–301)
Potassium: 3.3 mmol/L — ABNORMAL LOW (ref 3.5–5.1)
SODIUM: 139 mmol/L (ref 136–145)

## 2014-02-07 LAB — PROTIME-INR
INR: 1.5
Prothrombin Time: 17.4 secs — ABNORMAL HIGH (ref 11.5–14.7)

## 2014-02-07 LAB — CBC CANCER CENTER
BASOS ABS: 0 x10 3/mm (ref 0.0–0.1)
Basophil %: 0.9 %
EOS ABS: 0.1 x10 3/mm (ref 0.0–0.7)
EOS PCT: 1.8 %
HCT: 29.2 % — ABNORMAL LOW (ref 40.0–52.0)
HGB: 9.8 g/dL — ABNORMAL LOW (ref 13.0–18.0)
LYMPHS PCT: 42.2 %
Lymphocyte #: 2.3 x10 3/mm (ref 1.0–3.6)
MCH: 33.7 pg (ref 26.0–34.0)
MCHC: 33.5 g/dL (ref 32.0–36.0)
MCV: 101 fL — AB (ref 80–100)
MONO ABS: 0.3 x10 3/mm (ref 0.2–1.0)
Monocyte %: 6.2 %
NEUTROS PCT: 48.9 %
Neutrophil #: 2.7 x10 3/mm (ref 1.4–6.5)
PLATELETS: 163 x10 3/mm (ref 150–440)
RBC: 2.9 10*6/uL — ABNORMAL LOW (ref 4.40–5.90)
RDW: 19.1 % — ABNORMAL HIGH (ref 11.5–14.5)
WBC: 5.4 x10 3/mm (ref 3.8–10.6)

## 2014-02-11 LAB — PROT IMMUNOELECTROPHORES(ARMC)

## 2014-02-12 ENCOUNTER — Ambulatory Visit: Payer: Self-pay | Admitting: Oncology

## 2014-02-14 LAB — CBC CANCER CENTER
BASOS ABS: 0 x10 3/mm (ref 0.0–0.1)
BASOS PCT: 0.7 %
EOS PCT: 2.1 %
Eosinophil #: 0.1 x10 3/mm (ref 0.0–0.7)
HCT: 28.3 % — AB (ref 40.0–52.0)
HGB: 9.3 g/dL — AB (ref 13.0–18.0)
LYMPHS PCT: 36.7 %
Lymphocyte #: 1.7 x10 3/mm (ref 1.0–3.6)
MCH: 33.4 pg (ref 26.0–34.0)
MCHC: 33 g/dL (ref 32.0–36.0)
MCV: 101 fL — ABNORMAL HIGH (ref 80–100)
Monocyte #: 0.4 x10 3/mm (ref 0.2–1.0)
Monocyte %: 7.9 %
NEUTROS ABS: 2.4 x10 3/mm (ref 1.4–6.5)
Neutrophil %: 52.6 %
Platelet: 122 x10 3/mm — ABNORMAL LOW (ref 150–440)
RBC: 2.79 10*6/uL — AB (ref 4.40–5.90)
RDW: 19.2 % — ABNORMAL HIGH (ref 11.5–14.5)
WBC: 4.7 x10 3/mm (ref 3.8–10.6)

## 2014-02-14 LAB — BASIC METABOLIC PANEL
Anion Gap: 8 (ref 7–16)
BUN: 11 mg/dL (ref 7–18)
CALCIUM: 8 mg/dL — AB (ref 8.5–10.1)
CREATININE: 1.23 mg/dL (ref 0.60–1.30)
Chloride: 106 mmol/L (ref 98–107)
Co2: 25 mmol/L (ref 21–32)
EGFR (Non-African Amer.): 57 — ABNORMAL LOW
GLUCOSE: 144 mg/dL — AB (ref 65–99)
Osmolality: 279 (ref 275–301)
POTASSIUM: 3.7 mmol/L (ref 3.5–5.1)
SODIUM: 139 mmol/L (ref 136–145)

## 2014-02-14 LAB — MAGNESIUM: MAGNESIUM: 1.7 mg/dL — AB

## 2014-02-15 LAB — URINE IEP, RANDOM

## 2014-02-18 LAB — PROT IMMUNOELECTROPHORES(ARMC)

## 2014-02-28 LAB — CBC CANCER CENTER
BASOS ABS: 0 x10 3/mm (ref 0.0–0.1)
BASOS PCT: 0.8 %
EOS ABS: 0.1 x10 3/mm (ref 0.0–0.7)
Eosinophil %: 2.3 %
HCT: 30 % — ABNORMAL LOW (ref 40.0–52.0)
HGB: 10 g/dL — ABNORMAL LOW (ref 13.0–18.0)
LYMPHS PCT: 45.2 %
Lymphocyte #: 2 x10 3/mm (ref 1.0–3.6)
MCH: 33.4 pg (ref 26.0–34.0)
MCHC: 33.2 g/dL (ref 32.0–36.0)
MCV: 101 fL — AB (ref 80–100)
Monocyte #: 0.3 x10 3/mm (ref 0.2–1.0)
Monocyte %: 6.9 %
Neutrophil #: 2 x10 3/mm (ref 1.4–6.5)
Neutrophil %: 44.8 %
PLATELETS: 186 x10 3/mm (ref 150–440)
RBC: 2.98 10*6/uL — AB (ref 4.40–5.90)
RDW: 17.6 % — ABNORMAL HIGH (ref 11.5–14.5)
WBC: 4.5 x10 3/mm (ref 3.8–10.6)

## 2014-02-28 LAB — BASIC METABOLIC PANEL
Anion Gap: 9 (ref 7–16)
BUN: 13 mg/dL (ref 7–18)
CO2: 25 mmol/L (ref 21–32)
Calcium, Total: 8.8 mg/dL (ref 8.5–10.1)
Chloride: 104 mmol/L (ref 98–107)
Creatinine: 1.14 mg/dL (ref 0.60–1.30)
EGFR (Non-African Amer.): >60
Glucose: 126 mg/dL — ABNORMAL HIGH (ref 65–99)
Osmolality: 277 (ref 275–301)
Potassium: 3.9 mmol/L (ref 3.5–5.1)
Sodium: 138 mmol/L (ref 136–145)

## 2014-02-28 LAB — MAGNESIUM: Magnesium: 1.8 mg/dL

## 2014-03-07 LAB — CBC CANCER CENTER
BASOS PCT: 0.3 %
Basophil #: 0 x10 3/mm (ref 0.0–0.1)
Eosinophil #: 0.1 x10 3/mm (ref 0.0–0.7)
Eosinophil %: 1 %
HCT: 30.6 % — ABNORMAL LOW (ref 40.0–52.0)
HGB: 10.1 g/dL — ABNORMAL LOW (ref 13.0–18.0)
Lymphocyte #: 2 x10 3/mm (ref 1.0–3.6)
Lymphocyte %: 24.3 %
MCH: 33.1 pg (ref 26.0–34.0)
MCHC: 33 g/dL (ref 32.0–36.0)
MCV: 100 fL (ref 80–100)
Monocyte #: 0.6 x10 3/mm (ref 0.2–1.0)
Monocyte %: 7.3 %
NEUTROS PCT: 67.1 %
Neutrophil #: 5.5 x10 3/mm (ref 1.4–6.5)
Platelet: 124 x10 3/mm — ABNORMAL LOW (ref 150–440)
RBC: 3.05 10*6/uL — AB (ref 4.40–5.90)
RDW: 17 % — ABNORMAL HIGH (ref 11.5–14.5)
WBC: 8.2 x10 3/mm (ref 3.8–10.6)

## 2014-03-07 LAB — BASIC METABOLIC PANEL
ANION GAP: 6 — AB (ref 7–16)
BUN: 20 mg/dL — ABNORMAL HIGH (ref 7–18)
Calcium, Total: 8.2 mg/dL — ABNORMAL LOW (ref 8.5–10.1)
Chloride: 111 mmol/L — ABNORMAL HIGH (ref 98–107)
Co2: 23 mmol/L (ref 21–32)
Creatinine: 1.04 mg/dL (ref 0.60–1.30)
EGFR (African American): >60
Glucose: 130 mg/dL — ABNORMAL HIGH (ref 65–99)
OSMOLALITY: 284 (ref 275–301)
POTASSIUM: 3.6 mmol/L (ref 3.5–5.1)
Sodium: 140 mmol/L (ref 136–145)

## 2014-03-07 LAB — MAGNESIUM: Magnesium: 1.8 mg/dL

## 2014-03-12 LAB — PROT IMMUNOELECTROPHORES(ARMC)

## 2014-03-13 LAB — URINE IEP, RANDOM

## 2014-03-14 ENCOUNTER — Ambulatory Visit: Payer: Self-pay | Admitting: Oncology

## 2014-03-14 LAB — CBC CANCER CENTER
Basophil #: 0 x10 3/mm (ref 0.0–0.1)
Basophil %: 0.3 %
EOS PCT: 1.7 %
Eosinophil #: 0.1 x10 3/mm (ref 0.0–0.7)
HCT: 27.7 % — AB (ref 40.0–52.0)
HGB: 9.4 g/dL — ABNORMAL LOW (ref 13.0–18.0)
LYMPHS PCT: 28.1 %
Lymphocyte #: 1.4 x10 3/mm (ref 1.0–3.6)
MCH: 33.3 pg (ref 26.0–34.0)
MCHC: 33.7 g/dL (ref 32.0–36.0)
MCV: 99 fL (ref 80–100)
Monocyte #: 0.5 x10 3/mm (ref 0.2–1.0)
Monocyte %: 9.3 %
NEUTROS ABS: 3 x10 3/mm (ref 1.4–6.5)
NEUTROS PCT: 60.6 %
Platelet: 123 x10 3/mm — ABNORMAL LOW (ref 150–440)
RBC: 2.81 10*6/uL — ABNORMAL LOW (ref 4.40–5.90)
RDW: 16.2 % — AB (ref 11.5–14.5)
WBC: 4.9 x10 3/mm (ref 3.8–10.6)

## 2014-03-14 LAB — BASIC METABOLIC PANEL
Anion Gap: 7 (ref 7–16)
BUN: 17 mg/dL (ref 7–18)
CALCIUM: 8.2 mg/dL — AB (ref 8.5–10.1)
CHLORIDE: 106 mmol/L (ref 98–107)
Co2: 27 mmol/L (ref 21–32)
Creatinine: 1.18 mg/dL (ref 0.60–1.30)
EGFR (African American): >60
EGFR (Non-African Amer.): >60
Glucose: 194 mg/dL — ABNORMAL HIGH (ref 65–99)
Osmolality: 286 (ref 275–301)
Potassium: 3.1 mmol/L — ABNORMAL LOW (ref 3.5–5.1)
Sodium: 140 mmol/L (ref 136–145)

## 2014-03-14 LAB — MAGNESIUM: Magnesium: 1.8 mg/dL

## 2014-03-19 LAB — PROT IMMUNOELECTROPHORES(ARMC)

## 2014-03-25 NOTE — Telephone Encounter (Signed)
NONE 

## 2014-03-28 LAB — BASIC METABOLIC PANEL
ANION GAP: 9 (ref 7–16)
BUN: 20 mg/dL — AB (ref 7–18)
CREATININE: 1.33 mg/dL — AB (ref 0.60–1.30)
Calcium, Total: 8.4 mg/dL — ABNORMAL LOW (ref 8.5–10.1)
Chloride: 106 mmol/L (ref 98–107)
Co2: 23 mmol/L (ref 21–32)
EGFR (African American): >60
GFR CALC NON AF AMER: 56 — AB
GLUCOSE: 160 mg/dL — AB (ref 65–99)
Osmolality: 282 (ref 275–301)
POTASSIUM: 3.4 mmol/L — AB (ref 3.5–5.1)
SODIUM: 138 mmol/L (ref 136–145)

## 2014-03-28 LAB — CBC CANCER CENTER
BASOS PCT: 0.8 %
Basophil #: 0 x10 3/mm (ref 0.0–0.1)
Eosinophil #: 0.1 x10 3/mm (ref 0.0–0.7)
Eosinophil %: 2.2 %
HCT: 29.6 % — ABNORMAL LOW (ref 40.0–52.0)
HGB: 9.8 g/dL — ABNORMAL LOW (ref 13.0–18.0)
Lymphocyte #: 1.4 x10 3/mm (ref 1.0–3.6)
Lymphocyte %: 34.6 %
MCH: 32.9 pg (ref 26.0–34.0)
MCHC: 33 g/dL (ref 32.0–36.0)
MCV: 100 fL (ref 80–100)
MONOS PCT: 8.2 %
Monocyte #: 0.3 x10 3/mm (ref 0.2–1.0)
NEUTROS PCT: 54.2 %
Neutrophil #: 2.2 x10 3/mm (ref 1.4–6.5)
PLATELETS: 200 x10 3/mm (ref 150–440)
RBC: 2.98 10*6/uL — AB (ref 4.40–5.90)
RDW: 16.4 % — ABNORMAL HIGH (ref 11.5–14.5)
WBC: 4 x10 3/mm (ref 3.8–10.6)

## 2014-03-28 LAB — MAGNESIUM: Magnesium: 2 mg/dL

## 2014-03-29 LAB — PROT IMMUNOELECTROPHORES(ARMC)

## 2014-03-30 LAB — URINE IEP, RANDOM

## 2014-04-08 LAB — COMPREHENSIVE METABOLIC PANEL
ALT: 16 U/L
Albumin: 3.2 g/dL — ABNORMAL LOW (ref 3.4–5.0)
Alkaline Phosphatase: 72 U/L
Anion Gap: 8 (ref 7–16)
BUN: 15 mg/dL (ref 7–18)
Bilirubin,Total: 0.9 mg/dL (ref 0.2–1.0)
CALCIUM: 8.3 mg/dL — AB (ref 8.5–10.1)
Chloride: 105 mmol/L (ref 98–107)
Co2: 26 mmol/L (ref 21–32)
Creatinine: 1.09 mg/dL (ref 0.60–1.30)
EGFR (African American): >60
Glucose: 172 mg/dL — ABNORMAL HIGH (ref 65–99)
OSMOLALITY: 282 (ref 275–301)
Potassium: 3.5 mmol/L (ref 3.5–5.1)
SGOT(AST): 17 U/L (ref 15–37)
Sodium: 139 mmol/L (ref 136–145)
Total Protein: 8.2 g/dL (ref 6.4–8.2)

## 2014-04-08 LAB — CBC CANCER CENTER
Basophil #: 0 x10 3/mm (ref 0.0–0.1)
Basophil %: 0.7 %
EOS ABS: 0.1 x10 3/mm (ref 0.0–0.7)
EOS PCT: 1.9 %
HCT: 29.9 % — ABNORMAL LOW (ref 40.0–52.0)
HGB: 9.8 g/dL — AB (ref 13.0–18.0)
Lymphocyte #: 1.6 x10 3/mm (ref 1.0–3.6)
Lymphocyte %: 36.5 %
MCH: 32.4 pg (ref 26.0–34.0)
MCHC: 32.6 g/dL (ref 32.0–36.0)
MCV: 99 fL (ref 80–100)
Monocyte #: 0.3 x10 3/mm (ref 0.2–1.0)
Monocyte %: 7.3 %
Neutrophil #: 2.3 x10 3/mm (ref 1.4–6.5)
Neutrophil %: 53.6 %
Platelet: 170 x10 3/mm (ref 150–440)
RBC: 3.01 10*6/uL — ABNORMAL LOW (ref 4.40–5.90)
RDW: 15.9 % — ABNORMAL HIGH (ref 11.5–14.5)
WBC: 4.3 x10 3/mm (ref 3.8–10.6)

## 2014-04-08 LAB — MAGNESIUM: MAGNESIUM: 1.8 mg/dL

## 2014-04-14 ENCOUNTER — Ambulatory Visit: Payer: Self-pay | Admitting: Oncology

## 2014-04-15 LAB — CBC CANCER CENTER
Basophil #: 0 x10 3/mm (ref 0.0–0.1)
Basophil %: 0.3 %
EOS ABS: 0.1 x10 3/mm (ref 0.0–0.7)
Eosinophil %: 1.8 %
HCT: 29.3 % — ABNORMAL LOW (ref 40.0–52.0)
HGB: 9.8 g/dL — AB (ref 13.0–18.0)
Lymphocyte #: 1.4 x10 3/mm (ref 1.0–3.6)
Lymphocyte %: 27.4 %
MCH: 32.9 pg (ref 26.0–34.0)
MCHC: 33.3 g/dL (ref 32.0–36.0)
MCV: 99 fL (ref 80–100)
MONO ABS: 0.4 x10 3/mm (ref 0.2–1.0)
Monocyte %: 7 %
NEUTROS ABS: 3.3 x10 3/mm (ref 1.4–6.5)
Neutrophil %: 63.5 %
PLATELETS: 148 x10 3/mm — AB (ref 150–440)
RBC: 2.97 10*6/uL — ABNORMAL LOW (ref 4.40–5.90)
RDW: 16.2 % — AB (ref 11.5–14.5)
WBC: 5.1 x10 3/mm (ref 3.8–10.6)

## 2014-04-15 LAB — BASIC METABOLIC PANEL
Anion Gap: 11 (ref 7–16)
BUN: 15 mg/dL (ref 7–18)
CREATININE: 1.12 mg/dL (ref 0.60–1.30)
Calcium, Total: 7.9 mg/dL — ABNORMAL LOW (ref 8.5–10.1)
Chloride: 105 mmol/L (ref 98–107)
Co2: 25 mmol/L (ref 21–32)
EGFR (Non-African Amer.): >60
GLUCOSE: 145 mg/dL — AB (ref 65–99)
OSMOLALITY: 285 (ref 275–301)
POTASSIUM: 3.3 mmol/L — AB (ref 3.5–5.1)
Sodium: 141 mmol/L (ref 136–145)

## 2014-04-15 LAB — MAGNESIUM: Magnesium: 1.9 mg/dL

## 2014-04-29 LAB — CBC CANCER CENTER
BASOS PCT: 0.6 %
Basophil #: 0 x10 3/mm (ref 0.0–0.1)
EOS ABS: 0.1 x10 3/mm (ref 0.0–0.7)
EOS PCT: 0.9 %
HCT: 29.8 % — AB (ref 40.0–52.0)
HGB: 9.7 g/dL — AB (ref 13.0–18.0)
LYMPHS PCT: 19 %
Lymphocyte #: 1.1 x10 3/mm (ref 1.0–3.6)
MCH: 32 pg (ref 26.0–34.0)
MCHC: 32.5 g/dL (ref 32.0–36.0)
MCV: 98 fL (ref 80–100)
Monocyte #: 0.3 x10 3/mm (ref 0.2–1.0)
Monocyte %: 5.9 %
NEUTROS ABS: 4.2 x10 3/mm (ref 1.4–6.5)
Neutrophil %: 73.6 %
PLATELETS: 206 x10 3/mm (ref 150–440)
RBC: 3.03 10*6/uL — ABNORMAL LOW (ref 4.40–5.90)
RDW: 15.7 % — ABNORMAL HIGH (ref 11.5–14.5)
WBC: 5.7 x10 3/mm (ref 3.8–10.6)

## 2014-04-29 LAB — BASIC METABOLIC PANEL
Anion Gap: 9 (ref 7–16)
BUN: 11 mg/dL (ref 7–18)
Calcium, Total: 8.3 mg/dL — ABNORMAL LOW (ref 8.5–10.1)
Chloride: 107 mmol/L (ref 98–107)
Co2: 26 mmol/L (ref 21–32)
Creatinine: 0.99 mg/dL (ref 0.60–1.30)
EGFR (Non-African Amer.): >60
GLUCOSE: 87 mg/dL (ref 65–99)
Osmolality: 282 (ref 275–301)
POTASSIUM: 3.5 mmol/L (ref 3.5–5.1)
SODIUM: 142 mmol/L (ref 136–145)

## 2014-04-29 LAB — MAGNESIUM: Magnesium: 2 mg/dL

## 2014-04-30 LAB — PROT IMMUNOELECTROPHORES(ARMC)

## 2014-04-30 LAB — URINE IEP, RANDOM

## 2014-05-06 LAB — CBC CANCER CENTER
BASOS ABS: 0 x10 3/mm (ref 0.0–0.1)
BASOS PCT: 0.6 %
EOS ABS: 0.1 x10 3/mm (ref 0.0–0.7)
Eosinophil %: 1.8 %
HCT: 30.9 % — ABNORMAL LOW (ref 40.0–52.0)
HGB: 10.1 g/dL — AB (ref 13.0–18.0)
LYMPHS ABS: 1.4 x10 3/mm (ref 1.0–3.6)
Lymphocyte %: 34.4 %
MCH: 32.1 pg (ref 26.0–34.0)
MCHC: 32.6 g/dL (ref 32.0–36.0)
MCV: 98 fL (ref 80–100)
MONO ABS: 0.3 x10 3/mm (ref 0.2–1.0)
Monocyte %: 7.4 %
NEUTROS PCT: 55.8 %
Neutrophil #: 2.3 x10 3/mm (ref 1.4–6.5)
Platelet: 155 x10 3/mm (ref 150–440)
RBC: 3.14 10*6/uL — AB (ref 4.40–5.90)
RDW: 15.6 % — ABNORMAL HIGH (ref 11.5–14.5)
WBC: 4.1 x10 3/mm (ref 3.8–10.6)

## 2014-05-06 LAB — BASIC METABOLIC PANEL
Anion Gap: 9 (ref 7–16)
BUN: 19 mg/dL — ABNORMAL HIGH (ref 7–18)
CHLORIDE: 107 mmol/L (ref 98–107)
Calcium, Total: 8.6 mg/dL (ref 8.5–10.1)
Co2: 24 mmol/L (ref 21–32)
Creatinine: 1.12 mg/dL (ref 0.60–1.30)
EGFR (African American): >60
GLUCOSE: 107 mg/dL — AB (ref 65–99)
OSMOLALITY: 282 (ref 275–301)
POTASSIUM: 3.5 mmol/L (ref 3.5–5.1)
Sodium: 140 mmol/L (ref 136–145)

## 2014-05-13 LAB — CBC CANCER CENTER
BASOS ABS: 0 x10 3/mm (ref 0.0–0.1)
Basophil %: 0.3 %
EOS PCT: 1.8 %
Eosinophil #: 0.1 x10 3/mm (ref 0.0–0.7)
HCT: 31.5 % — ABNORMAL LOW (ref 40.0–52.0)
HGB: 10.4 g/dL — AB (ref 13.0–18.0)
Lymphocyte #: 1.5 x10 3/mm (ref 1.0–3.6)
Lymphocyte %: 27.5 %
MCH: 32.1 pg (ref 26.0–34.0)
MCHC: 33.1 g/dL (ref 32.0–36.0)
MCV: 97 fL (ref 80–100)
MONO ABS: 0.4 x10 3/mm (ref 0.2–1.0)
Monocyte %: 6.9 %
Neutrophil #: 3.5 x10 3/mm (ref 1.4–6.5)
Neutrophil %: 63.5 %
PLATELETS: 144 x10 3/mm — AB (ref 150–440)
RBC: 3.25 10*6/uL — AB (ref 4.40–5.90)
RDW: 15.8 % — AB (ref 11.5–14.5)
WBC: 5.5 x10 3/mm (ref 3.8–10.6)

## 2014-05-13 LAB — BASIC METABOLIC PANEL
ANION GAP: 9 (ref 7–16)
BUN: 20 mg/dL — ABNORMAL HIGH (ref 7–18)
CALCIUM: 8.8 mg/dL (ref 8.5–10.1)
CHLORIDE: 107 mmol/L (ref 98–107)
Co2: 24 mmol/L (ref 21–32)
Creatinine: 1.19 mg/dL (ref 0.60–1.30)
EGFR (African American): >60
Glucose: 126 mg/dL — ABNORMAL HIGH (ref 65–99)
Osmolality: 284 (ref 275–301)
Potassium: 3.9 mmol/L (ref 3.5–5.1)
Sodium: 140 mmol/L (ref 136–145)

## 2014-05-14 ENCOUNTER — Ambulatory Visit: Payer: Self-pay | Admitting: Oncology

## 2014-06-14 ENCOUNTER — Ambulatory Visit: Payer: Self-pay | Admitting: Oncology

## 2014-07-01 LAB — CBC CANCER CENTER
BASOS PCT: 0.7 %
Basophil #: 0 x10 3/mm (ref 0.0–0.1)
Eosinophil #: 0.1 x10 3/mm (ref 0.0–0.7)
Eosinophil %: 1.9 %
HCT: 32.3 % — AB (ref 40.0–52.0)
HGB: 10.5 g/dL — AB (ref 13.0–18.0)
LYMPHS PCT: 38.7 %
Lymphocyte #: 1.3 x10 3/mm (ref 1.0–3.6)
MCH: 31 pg (ref 26.0–34.0)
MCHC: 32.5 g/dL (ref 32.0–36.0)
MCV: 95 fL (ref 80–100)
Monocyte #: 0.1 x10 3/mm — ABNORMAL LOW (ref 0.2–1.0)
Monocyte %: 4.1 %
NEUTROS PCT: 54.6 %
Neutrophil #: 1.9 x10 3/mm (ref 1.4–6.5)
Platelet: 89 x10 3/mm — ABNORMAL LOW (ref 150–440)
RBC: 3.39 10*6/uL — ABNORMAL LOW (ref 4.40–5.90)
RDW: 16.5 % — ABNORMAL HIGH (ref 11.5–14.5)
WBC: 3.4 x10 3/mm — ABNORMAL LOW (ref 3.8–10.6)

## 2014-07-01 LAB — BASIC METABOLIC PANEL
ANION GAP: 4 — AB (ref 7–16)
BUN: 13 mg/dL (ref 7–18)
CALCIUM: 8.4 mg/dL — AB (ref 8.5–10.1)
CO2: 27 mmol/L (ref 21–32)
Chloride: 106 mmol/L (ref 98–107)
Creatinine: 1.31 mg/dL — ABNORMAL HIGH (ref 0.60–1.30)
EGFR (Non-African Amer.): 57 — ABNORMAL LOW
Glucose: 164 mg/dL — ABNORMAL HIGH (ref 65–99)
Osmolality: 278 (ref 275–301)
POTASSIUM: 4.1 mmol/L (ref 3.5–5.1)
Sodium: 137 mmol/L (ref 136–145)

## 2014-07-02 LAB — PROT IMMUNOELECTROPHORES(ARMC)

## 2014-07-10 LAB — URINE IEP, RANDOM

## 2014-07-15 ENCOUNTER — Ambulatory Visit: Payer: Self-pay | Admitting: Oncology

## 2014-08-13 ENCOUNTER — Ambulatory Visit
Admit: 2014-08-13 | Disposition: A | Discharge status: Home / Self Care | Payer: Self-pay | Attending: Oncology | Admitting: Oncology

## 2014-09-09 LAB — COMPREHENSIVE METABOLIC PANEL
ALK PHOS: 57 U/L
Albumin: 3.6 g/dL
Anion Gap: 2 — ABNORMAL LOW (ref 7–16)
BUN: 16 mg/dL
Bilirubin,Total: 1 mg/dL
CALCIUM: 8.8 mg/dL — AB
Chloride: 100 mmol/L — ABNORMAL LOW
Co2: 27 mmol/L
Creatinine: 1.3 mg/dL — ABNORMAL HIGH
EGFR (African American): >60
EGFR (Non-African Amer.): 53 — ABNORMAL LOW
GLUCOSE: 313 mg/dL — AB
POTASSIUM: 4 mmol/L
SGOT(AST): 19 U/L
SGPT (ALT): 18 U/L
SODIUM: 129 mmol/L — AB
Total Protein: 10.2 g/dL — ABNORMAL HIGH

## 2014-09-09 LAB — CBC CANCER CENTER
BASOS ABS: 0 x10 3/mm (ref 0.0–0.1)
Basophil %: 0.9 %
Eosinophil #: 0 x10 3/mm (ref 0.0–0.7)
Eosinophil %: 1.3 %
HCT: 28.9 % — ABNORMAL LOW (ref 40.0–52.0)
HGB: 9.7 g/dL — ABNORMAL LOW (ref 13.0–18.0)
LYMPHS PCT: 53.3 %
Lymphocyte #: 1.2 x10 3/mm (ref 1.0–3.6)
MCH: 31.1 pg (ref 26.0–34.0)
MCHC: 33.7 g/dL (ref 32.0–36.0)
MCV: 93 fL (ref 80–100)
Monocyte #: 0.1 x10 3/mm — ABNORMAL LOW (ref 0.2–1.0)
Monocyte %: 5.3 %
NEUTROS PCT: 39.2 %
Neutrophil #: 0.9 x10 3/mm — ABNORMAL LOW (ref 1.4–6.5)
Platelet: 41 x10 3/mm — ABNORMAL LOW (ref 150–440)
RBC: 3.12 10*6/uL — AB (ref 4.40–5.90)
RDW: 18.7 % — ABNORMAL HIGH (ref 11.5–14.5)
WBC: 2.3 x10 3/mm — ABNORMAL LOW (ref 3.8–10.6)

## 2014-09-10 LAB — URINE IEP, RANDOM

## 2014-09-10 LAB — PROT IMMUNOELECTROPHORES(ARMC)

## 2014-09-13 ENCOUNTER — Ambulatory Visit
Admit: 2014-09-13 | Disposition: A | Discharge status: Home / Self Care | Payer: Self-pay | Attending: Oncology | Admitting: Oncology

## 2014-09-16 LAB — CBC CANCER CENTER
Basophil #: 0 x10 3/mm (ref 0.0–0.1)
Basophil %: 0.8 %
EOS PCT: 2.8 %
Eosinophil #: 0.1 x10 3/mm (ref 0.0–0.7)
HCT: 27.3 % — AB (ref 40.0–52.0)
HGB: 9.3 g/dL — ABNORMAL LOW (ref 13.0–18.0)
LYMPHS ABS: 1.7 x10 3/mm (ref 1.0–3.6)
Lymphocyte %: 51.6 %
MCH: 31 pg (ref 26.0–34.0)
MCHC: 33.9 g/dL (ref 32.0–36.0)
MCV: 92 fL (ref 80–100)
MONO ABS: 0.2 x10 3/mm (ref 0.2–1.0)
Monocyte %: 6.7 %
NEUTROS ABS: 1.2 x10 3/mm — AB (ref 1.4–6.5)
Neutrophil %: 38.1 %
PLATELETS: 34 x10 3/mm — AB (ref 150–440)
RBC: 2.98 10*6/uL — AB (ref 4.40–5.90)
RDW: 18.6 % — ABNORMAL HIGH (ref 11.5–14.5)
WBC: 3.2 x10 3/mm — ABNORMAL LOW (ref 3.8–10.6)

## 2014-09-16 LAB — BASIC METABOLIC PANEL
ANION GAP: 3 — AB (ref 7–16)
BUN: 26 mg/dL — AB
CALCIUM: 8.2 mg/dL — AB
CO2: 25 mmol/L
Chloride: 97 mmol/L — ABNORMAL LOW
Creatinine: 1.44 mg/dL — ABNORMAL HIGH
EGFR (African American): 54 — ABNORMAL LOW
GFR CALC NON AF AMER: 47 — AB
Glucose: 397 mg/dL — ABNORMAL HIGH
Potassium: 4.1 mmol/L
Sodium: 125 mmol/L — ABNORMAL LOW

## 2014-09-18 ENCOUNTER — Inpatient Hospital Stay
Admit: 2014-09-18 | Disposition: A | Discharge status: Home / Self Care | Payer: Self-pay | Attending: Internal Medicine | Admitting: Internal Medicine

## 2014-09-18 LAB — PROTIME-INR
INR: 5.2 — AB
Prothrombin Time: 47.8 secs — ABNORMAL HIGH

## 2014-09-18 LAB — URINALYSIS, COMPLETE
BILIRUBIN, UR: NEGATIVE
Bacteria: NONE SEEN
Glucose,UR: >=500 mg/dL (ref 0–75)
Ketone: NEGATIVE
LEUKOCYTE ESTERASE: NEGATIVE
Nitrite: NEGATIVE
Ph: 5 (ref 4.5–8.0)
Protein: NEGATIVE
SQUAMOUS EPITHELIAL: NONE SEEN
Specific Gravity: 1.017 (ref 1.003–1.030)
WBC UR: <1 /HPF (ref 0–5)

## 2014-09-18 LAB — COMPREHENSIVE METABOLIC PANEL
ALBUMIN: 3.4 g/dL — AB
ALK PHOS: 61 U/L
ALT: 19 U/L
AST: 23 U/L
Anion Gap: 7 (ref 7–16)
BUN: 52 mg/dL — AB
Bilirubin,Total: 1.1 mg/dL
CALCIUM: 8.5 mg/dL — AB
Chloride: 92 mmol/L — ABNORMAL LOW
Co2: 23 mmol/L
Creatinine: 1.9 mg/dL — ABNORMAL HIGH
EGFR (African American): 39 — ABNORMAL LOW
EGFR (Non-African Amer.): 33 — ABNORMAL LOW
GLUCOSE: 668 mg/dL — AB
Potassium: 4.8 mmol/L
SODIUM: 122 mmol/L — AB
Total Protein: 9.9 g/dL — ABNORMAL HIGH

## 2014-09-18 LAB — CBC
HCT: 27.7 % — AB (ref 40.0–52.0)
HGB: 9.2 g/dL — AB (ref 13.0–18.0)
MCH: 30.8 pg (ref 26.0–34.0)
MCHC: 33.3 g/dL (ref 32.0–36.0)
MCV: 93 fL (ref 80–100)
Platelet: 39 10*3/uL — ABNORMAL LOW (ref 150–440)
RBC: 2.99 10*6/uL — ABNORMAL LOW (ref 4.40–5.90)
RDW: 19 % — AB (ref 11.5–14.5)
WBC: 2.5 10*3/uL — ABNORMAL LOW (ref 3.8–10.6)

## 2014-09-18 LAB — TROPONIN I: Troponin-I: <0.03 ng/mL

## 2014-09-18 LAB — CK-MB: CK-MB: 1 ng/mL

## 2014-09-18 LAB — HEMOGLOBIN A1C: Hemoglobin A1C: 10.8 % — ABNORMAL HIGH

## 2014-09-19 LAB — BASIC METABOLIC PANEL
Anion Gap: 6 — ABNORMAL LOW (ref 7–16)
BUN: 41 mg/dL — ABNORMAL HIGH
CALCIUM: 7.9 mg/dL — AB
CO2: 22 mmol/L
Chloride: 106 mmol/L
Creatinine: 1.68 mg/dL — ABNORMAL HIGH
EGFR (African American): 45 — ABNORMAL LOW
EGFR (Non-African Amer.): 39 — ABNORMAL LOW
GLUCOSE: 108 mg/dL — AB
Potassium: 3.7 mmol/L
Sodium: 134 mmol/L — ABNORMAL LOW

## 2014-09-19 LAB — CBC WITH DIFFERENTIAL/PLATELET
Comment - H1-Com2: NORMAL
HCT: 23.6 % — AB (ref 40.0–52.0)
HGB: 7.8 g/dL — AB (ref 13.0–18.0)
Lymphocytes: 49 %
MCH: 30.3 pg (ref 26.0–34.0)
MCHC: 33 g/dL (ref 32.0–36.0)
MCV: 92 fL (ref 80–100)
MONOS PCT: 6 %
PLATELETS: 38 10*3/uL — AB (ref 150–440)
RBC: 2.57 10*6/uL — ABNORMAL LOW (ref 4.40–5.90)
RDW: 18.9 % — AB (ref 11.5–14.5)
Segmented Neutrophils: 45 %
WBC: 2.3 10*3/uL — AB (ref 3.8–10.6)

## 2014-09-19 LAB — CLOSTRIDIUM DIFFICILE(ARMC)

## 2014-09-19 LAB — MAGNESIUM: Magnesium: 2.1 mg/dL

## 2014-09-19 LAB — PROTIME-INR
INR: 5.1 — AB
Prothrombin Time: 47.1 secs — ABNORMAL HIGH

## 2014-09-19 LAB — TROPONIN I: Troponin-I: <0.03 ng/mL

## 2014-09-19 LAB — CK-MB: CK-MB: 1 ng/mL

## 2014-09-20 LAB — BASIC METABOLIC PANEL
Anion Gap: 3 — ABNORMAL LOW (ref 7–16)
BUN: 21 mg/dL — ABNORMAL HIGH
CALCIUM: 7.9 mg/dL — AB
CREATININE: 1.21 mg/dL
Chloride: 109 mmol/L
Co2: 23 mmol/L
EGFR (African American): >60
GFR CALC NON AF AMER: 58 — AB
Glucose: 176 mg/dL — ABNORMAL HIGH
Potassium: 4.2 mmol/L
Sodium: 135 mmol/L

## 2014-09-20 LAB — CBC WITH DIFFERENTIAL/PLATELET
BASOS PCT: 0.3 %
Basophil #: 0 10*3/uL (ref 0.0–0.1)
Comment - H1-Com3: NORMAL
EOS ABS: 0.1 10*3/uL (ref 0.0–0.7)
Eosinophil %: 2.3 %
Eosinophil: 4 %
HCT: 24.6 % — ABNORMAL LOW (ref 40.0–52.0)
HGB: 8.1 g/dL — AB (ref 13.0–18.0)
Lymphocyte #: 1.2 10*3/uL (ref 1.0–3.6)
Lymphocyte %: 50.9 %
Lymphocytes: 50 %
MCH: 30.6 pg (ref 26.0–34.0)
MCHC: 33.1 g/dL (ref 32.0–36.0)
MCV: 92 fL (ref 80–100)
MONO ABS: 0.2 x10 3/mm (ref 0.2–1.0)
Monocyte %: 8.4 %
Monocytes: 3 %
Neutrophil #: 0.9 10*3/uL — ABNORMAL LOW (ref 1.4–6.5)
Neutrophil %: 38.1 %
PLATELETS: 34 10*3/uL — AB (ref 150–440)
RBC: 2.66 10*6/uL — ABNORMAL LOW (ref 4.40–5.90)
RDW: 18.5 % — ABNORMAL HIGH (ref 11.5–14.5)
Segmented Neutrophils: 38 %
Variant Lymphocyte - H1-Rlymph: 5 %
WBC: 2.3 10*3/uL — ABNORMAL LOW (ref 3.8–10.6)

## 2014-09-20 LAB — PROTIME-INR
INR: 2.5
Prothrombin Time: 27.3 secs — ABNORMAL HIGH

## 2014-09-21 LAB — STOOL CULTURE

## 2014-09-23 LAB — COMPREHENSIVE METABOLIC PANEL
ALK PHOS: 51 U/L
Albumin: 3.4 g/dL — ABNORMAL LOW
Anion Gap: 7 (ref 7–16)
BUN: 29 mg/dL — ABNORMAL HIGH
Bilirubin,Total: 1 mg/dL
CALCIUM: 8.6 mg/dL — AB
CREATININE: 1.4 mg/dL — AB
Chloride: 95 mmol/L — ABNORMAL LOW
Co2: 25 mmol/L
EGFR (African American): 56 — ABNORMAL LOW
EGFR (Non-African Amer.): 48 — ABNORMAL LOW
Glucose: 400 mg/dL — ABNORMAL HIGH
Potassium: 4.7 mmol/L
SGOT(AST): 25 U/L
SGPT (ALT): 21 U/L
SODIUM: 127 mmol/L — AB
TOTAL PROTEIN: 10.3 g/dL — AB

## 2014-09-23 LAB — CBC CANCER CENTER
Basophil #: 0 x10 3/mm (ref 0.0–0.1)
Basophil %: 0.4 %
EOS PCT: 1.8 %
Eosinophil #: 0.1 x10 3/mm (ref 0.0–0.7)
HCT: 27.7 % — AB (ref 40.0–52.0)
HGB: 9.3 g/dL — ABNORMAL LOW (ref 13.0–18.0)
LYMPHS ABS: 1.6 x10 3/mm (ref 1.0–3.6)
Lymphocyte %: 46.1 %
MCH: 30.9 pg (ref 26.0–34.0)
MCHC: 33.6 g/dL (ref 32.0–36.0)
MCV: 92 fL (ref 80–100)
MONOS PCT: 5.6 %
Monocyte #: 0.2 x10 3/mm (ref 0.2–1.0)
Neutrophil #: 1.6 x10 3/mm (ref 1.4–6.5)
Neutrophil %: 46.1 %
PLATELETS: 56 x10 3/mm — AB (ref 150–440)
RBC: 3.02 10*6/uL — AB (ref 4.40–5.90)
RDW: 18.7 % — ABNORMAL HIGH (ref 11.5–14.5)
WBC: 3.4 x10 3/mm — AB (ref 3.8–10.6)

## 2014-09-23 LAB — PROTIME-INR
INR: 1.1
Prothrombin Time: 14.3 secs

## 2014-09-30 LAB — CBC CANCER CENTER
BASOS ABS: 0 x10 3/mm (ref 0.0–0.1)
Basophil %: 0.6 %
Eosinophil #: 0.1 x10 3/mm (ref 0.0–0.7)
Eosinophil %: 1.8 %
HCT: 26.7 % — AB (ref 40.0–52.0)
HGB: 8.9 g/dL — ABNORMAL LOW (ref 13.0–18.0)
Lymphocyte #: 1.7 x10 3/mm (ref 1.0–3.6)
Lymphocyte %: 38.1 %
MCH: 30.9 pg (ref 26.0–34.0)
MCHC: 33.3 g/dL (ref 32.0–36.0)
MCV: 93 fL (ref 80–100)
MONO ABS: 0.4 x10 3/mm (ref 0.2–1.0)
Monocyte %: 9.5 %
Neutrophil #: 2.2 x10 3/mm (ref 1.4–6.5)
Neutrophil %: 50 %
Platelet: 55 x10 3/mm — ABNORMAL LOW (ref 150–440)
RBC: 2.88 10*6/uL — ABNORMAL LOW (ref 4.40–5.90)
RDW: 18.7 % — AB (ref 11.5–14.5)
WBC: 4.4 x10 3/mm (ref 3.8–10.6)

## 2014-09-30 LAB — COMPREHENSIVE METABOLIC PANEL
ALK PHOS: 58 U/L
Albumin: 3.3 g/dL — ABNORMAL LOW
Anion Gap: 5 — ABNORMAL LOW (ref 7–16)
BUN: 37 mg/dL — ABNORMAL HIGH
Bilirubin,Total: 0.8 mg/dL
Calcium, Total: 8.1 mg/dL — ABNORMAL LOW
Chloride: 97 mmol/L — ABNORMAL LOW
Co2: 22 mmol/L
Creatinine: 2.04 mg/dL — ABNORMAL HIGH
EGFR (African American): 36 — ABNORMAL LOW
EGFR (Non-African Amer.): 31 — ABNORMAL LOW
Glucose: 413 mg/dL — ABNORMAL HIGH
POTASSIUM: 5.7 mmol/L — AB
SGOT(AST): 22 U/L
SGPT (ALT): 22 U/L
Sodium: 124 mmol/L — ABNORMAL LOW
Total Protein: 10.8 g/dL — ABNORMAL HIGH

## 2014-09-30 LAB — MAGNESIUM: MAGNESIUM: 1.9 mg/dL

## 2014-10-02 LAB — PROT IMMUNOELECTROPHORES(ARMC)

## 2014-10-05 NOTE — H&P (Signed)
PATIENT NAME:  Peter Walters, KNIGHT MR#:  [id number] DATE OF BIRTH:  [birth year]  DATE OF ADMISSION:  11/28/2013  PRIMARY CARE PHYSICIAN:  Sena Hitch, MD, at [facility].   CARDIOLOGY: [facility] cardiology.   REQUESTING PHYSICIAN: Baird Cancer. Quale, MD  CHIEF COMPLAINT: Chest pain and shortness of breath.   HISTORY OF PRESENT ILLNESS: The patient is a 79 year old male with a known history of multiple myeloma, followed by [facility], history of arrhythmia, on Coumadin and gastroesophageal reflux disease, who is being admitted for chest pain and shortness of breath on and off for the last 2 to 3 months. Has been getting worse for last week to the point that he was having trouble to manage his daily activities and decided to come to the Emergency Department. While in the ED, he is still complaining of left-sided chest pain, not radiating, about 6 out of 10 in severity.  He is unable to walk across the room without getting short of breath, which is making it difficult for him to have his daily activities of living. He denies any fever, cough or palpitation. He reports having pain when he takes deep breath. He also feels sweaty at times.   PAST MEDICAL HISTORY: 1.  Atrial fibrillation, on Coumadin.  2.  Hypertension.  3.  Benign prostatic hypertrophy.  4.  History of gastrointestinal bleed and ulcers, has not been taking any nonsteroidals, aspirin or Celebrex since then.  5.  Hyperlipidemia.  6.  History of multiple myeloma.   ALLERGIES: ASPIRIN, CELEBREX, AND NONSTEROIDALS MAINLY BECAUSE OF HISTORY OF GI BLEED.   SOCIAL HISTORY: No smoking. No alcohol. He lives at home.   FAMILY HISTORY: Father had heart disease.   MEDICATIONS AT HOME: 1.  Coumadin 2.5 mg p.o. daily.  2.  Enalapril 20 mg p.o. b.i.d.  3.  Lasix 20 mg p.o. daily.  4.  Lovastatin 40 mg p.o. daily.  5.  Magnesium oxide 400 mg p.o. b.i.d.  6.  Metformin 1000 mg p.o. daily.  7.  Metoprolol 25 mg p.o. daily.  8.  Potassium chloride 10  mEq p.o. daily.  9.  Revlimid 25 mg p.o. daily for 21 days with 7 days off.   10.  Tikosyn 500 mcg 1 capsules twice a day.    REVIEW OF SYSTEMS:    CONSTITUTIONAL: No fever. Positive fatigue and weakness.  EYES:  No blurred or double vision.   EARS, NOSE AND THROAT:  Decreased hearing.  No ear pain. RESPIRATORY:  No cough, wheezing, hemoptysis. CARDIOVASCULAR:  Positive for chest pain, also shortness of breath mainly with exertion. GASTROINTESTINAL: No nausea, vomiting or diarrhea. GENITOURINARY:  No hematuria.  ENDOCRINE: No polyuria or nocturia.  HEMATOLOGY: No anemia or easy bruising. History of multiple myeloma, being treated.  SKIN: No rash or lesion.  MUSCULOSKELETAL: No arthritis or muscle cramp.  NEUROLOGIC: No tingling, numbness, weakness.  PSYCHIATRY: No history of anxiety or depression.   PHYSICAL EXAMINATION: VITAL SIGNS: Temperature 98.3, heart rate 63 per minute, respirations 20 per minute, blood pressure 148/62 mmHg, saturation 100% on room air.    EYES:  Pupils equal, round, reactive to light and accommodation.  No scleral icterus.  Extraocular muscles intact.   HEENT:  Head atraumatic, normocephalic.  Oropharynx and nasopharynx clear.   NECK:  Supple.  No JVD, No thyroid enlargement or tenderness.   LUNGS:  Clear to auscultation.  No wheezing, rales, rhonchi or crepitation.   CARDIOVASCULAR:  Irregular rate. No Murmur, rales or gallop. ABDOMEN:  Soft,  nontender, nondistended.  Bowel sounds present.  No organomegaly or masses.   EXTREMITIES: No pedal edema, cyanosis, clubbing.  NEUROLOGIC: Nonfocal examination. Cranial nerves II through XII intact. Muscle strength 5 out of 5 in all extremities.  Sensation intact.  PSYCHIATRIC: Alert and oriented x 3.  SKIN: No obvious rash, lesion or ulcer.  MUSCULOSKELETAL: No joint effusion or tenderness.    LABORATORY, DIAGNOSTIC AND RADIOLOGICAL DATA:   1.  BMP within normal limits. Negative first set of cardiac enzymes. CBC  showed white count of 3.1, hemoglobin 9.4, hematocrit 27, platelets 51. PT 3.4, INR 2.1.  2.  Chest x-ray in the ED showed no acute cardiopulmonary disease.  3.  EKG showed no acute ST-T changes.   IMPRESSION AND PLAN:  1.  Unstable Angina.  Get serial troponins. He does report getting a stress about 4 years ago which was negative. Denies any history of catheterization.  Obtain cardiology consult possible cath vs myoview in the morning depending on cardio evaluation.  Cannot give aspirin due to history of gastrointestinal bleed and ulcers.  2.  History of atrial fibrillation, on Coumadin. We will continue same. His INR is therapeutic.  3.  History of heart failure. Will obtain 2-D echocardiogram, have daily weights. Strict ins and outs. The patient denies any significant weight gain or leg edema.  4.  Hypertension. We will continue metoprolol and Lasix at this time.  5.  Multiple Myeloma: continue Revlimid and outpt f/up with Oncology.  CODE STATUS: Full code.   TOTAL TIME TAKING CARE OF THIS PATIENT: 55 minutes.     ____________________________ Lucina Mellow. Manuella Ghazi, MD vss:cs D: 11/28/2013 14:05:00 ET T: 11/28/2013 14:25:53 ET JOB#: [id number]  cc: Sena Hitch, MD Jaina Morin S. Manuella Ghazi, MD, <Dictator> Cardiologist at [place] MD ELECTRONICALLY SIGNED 11/30/2013 8:57

## 2014-10-05 NOTE — Discharge Summary (Signed)
PATIENT NAME:  Peter Walters, Peter Walters MR#:  [id number] DATE OF BIRTH:  [birth year]  DATE OF ADMISSION:  11/28/2013 DATE OF DISCHARGE:  11/29/2013  ADMITTING DIAGNOSIS:  Chest pain, unstable angina.   DISCHARGE DIAGNOSES:  1.  Chest pain of unclear etiology at this time.  Unlikely angina.  Negative stress test.  Negative cardiac enzymes.  2.  Hyponatremia.  3.  Pancytopenia due to Revlimid.  4.  Normal ejection fraction on echocardiogram.  5.  History of atrial fibrillation, in sinus rhythm now, on Coumadin therapy anticoagulation.  6.  Hypertension.  7.  Hyperlipidemia.  8.  Benign prostatic hypertrophy.  9.  Gastrointestinal bleed in the past.  10.  Peptic ulcer disease.  11.  Multiple myeloma.  12.  Congestive heart failure.   DISCHARGE CONDITION:  Stable.   DISCHARGE MEDICATIONS:  The patient is to continue furosemide 20 mg by mouth daily, Tikosyn 500 mcg twice daily, enalapril 10 mg by mouth twice daily, metoprolol extended-release 25 mg by mouth daily, metformin 500 mg 2 tablets once daily, magnesium oxide 400 mg by mouth twice daily, lovastatin 40 mg by mouth daily.  The patient is not to take Coumadin, Revlimid and potassium until recommended by primary care physician.   HOME OXYGEN:  None.   DIET:  2 grams salt, low-fat, low-cholesterol, carbohydrate-controlled diet, regular consistency.  ACTIVITY LIMITATIONS:  As tolerated.   FOLLOWUP APPOINTMENT:  With Dr. Shade Flood in two days after discharge, Dr. Rockey Situ in one week after discharge.   CONSULTANTS:  Care management, social work, Dr. Fletcher Anon, Dr. Rockey Situ.   RADIOLOGIC STUDIES:  Myocardial scan 11/29/2013 revealing pharmacological myocardial perfusion study with no significant ischemia.  No significant wall motion abnormality noted.  The estimated ejection fraction was 82%.  The left ventricular global function was normal.  There are no EKG changes concerning for ischemia.  There is GI uptake artifact noted on this study.  Overall this is  a low risk scan.  Chest x-ray, PA and lateral, 11/28/2013, revealed hyperinflated lungs, but no acute cardiopulmonary process.  Repeated chest x-ray PA and lateral, 11/29/2013, showed hyperinflated lungs with left base scarring, but no acute abnormalities.  Echocardiogram 11/28/2013 revealing left ventricular ejection fraction by visual estimation 55% to 60%, normal global left ventricular systolic function, mild concentric left ventricular hypertrophy, mildly dilated left atrium, mildly dilated right atrium, mildly elevated pulmonary arterial systolic pressure, mild tricuspid regurgitation.   HOSPITAL COURSE:  The patient is a 79 year old male with past medical history significant for history of coronary artery disease who presents to the hospital with complaints of chest pain as well as shortness of breath.  Please refer to Dr. Remer Macho admission note on 11/28/2013.  On arrival to the hospital, the patient's vital signs, temperature was 98.3, pulse was 63, respiratory rate was 20, blood pressure 148/62, saturation was 100% on room air.  Physical exam was unremarkable.  The patient's lab data done on arrival to the hospital 11/28/2013 showed a glucose level of 130, sodium 134, otherwise BMP was unremarkable.  The patient's magnesium level was normal at 2.1.  Liver enzymes revealed total bilirubin level of 1.1, otherwise liver enzymes were normal.  The patient's troponin was normal x 3.  The patient's white blood cell count was low at 3.5, hemoglobin was 9.4, platelet count was 51, Pro Time was 23.4 and INR was 2.1.  EKG done on admission 11/28/2013 showed sinus brady at a rate of 57 beats per minute, nonspecific ST-T changes and no significant change since prior  EKG done in June 2015.  The patient was admitted to the hospital for further evaluation.  Initially it was thought to have cardiac catheterization; however, since the patient's Pro Time was elevated to 26.7 and INR of 2.5 as well as platelet count  being low at 43, it was felt that the patient is not a candidate for cardiac catheterization.  He instead underwent Myoview stress test.  Pharmacological Myoview stress test was unremarkable, was in fact normal.  It was felt that the patient's chest pain was unlikely cardiac, but again it was of unclear etiology.  It could have been related to musculoskeletal problems in view of his hyperinflation.  The patient was advised to continue follow-up with his primary care physician Dr. Shade Flood.  He has albuterol or Combivent added if he continues to have significant discomfort in his chest with exertion.   In regards to hyponatremia, the patient's sodium level was rechecked and was found to be slightly better at 135.  It is recommended to follow the patient's sodium level as outpatient and make decisions to investigate him for hyponatremia.  In regards to pancytopenia, it was felt to be pancytopenia related to Revlimid chemotherapy drug for multiple myeloma.  The patient was advised to stop this medication Revlimid until his white blood cell count and platelet count improves.  The patient's CBC was rechecked again on 11/29/2013 and unfortunately it was found to be slightly worse than it was in the past with platelet count  going down to 43 and hemoglobin level of 9.2 and white blood cell count of 3.2.  The patient is to follow up with Dr. Shade Flood and resume his usual doses of Revlimid depending on CBC levels.   In regards to atrial fibrillation, since the patient was in sinus rhythm in the hospital and since he was thrombocytopenic, it was felt that this would be best to suspend his Coumadin at this time until further recommendations by his primary care physician or cardiologist.   In regards to hypertension, hyperlipidemia, BPH, multiple myeloma, otherwise, no other changes were made in medication regimen.   Again, as mentioned above, the patient's Coumadin is being suspended, Revlimid.  It is recommended to  follow the patient's potassium levels as outpatient to make sure that they are increasing.  On the day of discharge, the patient's potassium was 4.3 and this was felt to be best to suspend his potassium supplements as well.  The patient is being discharged in stable condition with the above-mentioned medications and follow-up.   On the day of discharge, 11/29/2013, the patient's vital signs, temperature was 97.9, pulse was 62, respiration rate was 18, blood pressure 146/81, saturation was 100% on room air at rest.   TIME SPENT:  40 minutes.    ____________________________ Theodoro Grist, MD rv:ea D: 11/29/2013 19:05:03 ET T: 11/30/2013 04:18:50 ET JOB#: [id number]  cc: Theodoro Grist, MD, <Dictator> Sena Hitch, MD [place] MD ELECTRONICALLY SIGNED 12/08/2013 18:48

## 2014-10-05 NOTE — Discharge Summary (Signed)
PATIENT NAME:  Peter Walters, Peter Walters MR#:  [id number] DATE OF BIRTH:  [birth year]  DATE OF ADMISSION:  12/17/2013 DATE OF DISCHARGE:  12/18/2013  PRIMARY CARE PHYSICIAN: Dr. Shade Flood.  DISCHARGE DIAGNOSES:  1.  Fatigue and weakness.  2.  Acute renal failure.  3.  History of atrial fibrillation. 4.  Chronic obstructive pulmonary disease. 5.  Diabetes. 6.  Multiple myeloma.   CONDITION: Stable.   CODE STATUS: FULL CODE.   MEDICATIONS: Please refer to the medication reconciliation list. Lasix, metformin, and Revlimid will be discontinued due to renal failure.   DIET: Low-sodium, ADA diet.   ACTIVITY: As tolerated.   FOLLOW-UP CARE: Follow-up with PCP within 1-2 weeks. Follow up with Dr. Grayland Ormond within 1-2 weeks. The patient will need a follow up BMP with PCP and follow up with Dr. Grayland Ormond for possible resuming Revlimid.   REASON FOR ADMISSION: Fatigue.  HOSPITAL COURSE:  1.  The patient is a 79 year old African American male with a history of atrial fibrillation off Coumadin, hypertension, hyperlipidemia, multiple myeloma who presented to the ED with weakness and unable to talk due to hoarseness of his voice.  In addition, the patient has a cough with yellowish phlegm.  In addition, the patient has dyspnea on exertion. His creatinine is elevated to 1.62.  For detailed history and physical examination, please refer to the admission note dictated by Dr. Charissa Bash.   Laboratory data on admission date showed BUN 21, creatinine 1.62, sodium 134, otherwise unremarkable.  Otherwise BMP was unremarkable. WBC 5.7, hemoglobin 8.8. Urinalysis was negative. Chest x-ray showed stable COPD.   1.  Weakness and fatigue, unclear etiology, possibly due to Revlimid.  Patient's cardiac enzymes level is selective.  No evidence of ACS. The patient was treated with Levaquin on admission; however, the patient's CAT scan of the head did not show any sinusitis.   2.  Acute renal failure. The patient's Lasix was on hold  due to acute renal failure. In addition, the patient got IV fluid support. Creatinine decreased to 1.52 today.  The patient's metformin and Revlimid is on hold due to acute renal failure.   3.  Atrial fibrillation, rate controlled.   4.  History of multiple myeloma. The patient needs to follow up with Dr. Grayland Ormond as outpatient.   5.  The patient still has some fatigue, but feels better.  Vital signs are stable. He is clinically stable.  Will be discharged to home today. I discussed the patient's discharge plan with the patient, nurse, case manager.   TIME SPENT: About 37 minutes.    ____________________________ Demetrios Loll, MD qc:ts D: 12/18/2013 14:07:32 ET T: 12/18/2013 16:23:12 ET JOB#: [id number]  cc: Demetrios Loll, MD, <Dictator> Demetrios Loll MD ELECTRONICALLY SIGNED 12/18/2013 18:06

## 2014-10-05 NOTE — H&P (Signed)
PATIENT NAME:  Peter Walters, Peter Walters MR#:  [id number] DATE OF BIRTH:  06-06-1936  DATE OF ADMISSION:  12/17/2013  PRIMARY CARE PHYSICIAN:  Dr. Shade Flood.  The patient is a 79 year old African American male who was recently admitted to the hospital for chest pain of unclear etiology with negative stress test and negative cardiac enzymes, who presented back to the hospital with complaints of weakness, inability to move around, not able to talk due to hoarseness of his voice, also coughing up yellow-looking phlegm. According to the patient, he was doing relatively well up until Friday, 3 days ago, when he started having problems with multiple issues including feeling feverish, chilly, as well as fatigue and weak. He admits of having some weight loss, more than 12 pounds over the past 6 weeks. He also admits of having cough with yellow phlegm, as well as wheezing and intermittent hemoptysis. Admits of arrhythmias, as well as significant dyspnea on exertion. Because of his discomfort, he decided to come to the Emergency Room for further evaluation. He was recently diagnosed with otitis media versus ruptured right tympanic membrane. He was treated with topical medications and his discomfort and drainage from his right ear improved. He still, however, complains of some throat pain and feeling sweaty, having chest pains radiating to the left arm, as well as significant shortness of breath. Admits of having poor p.o. intake, as well as some diarrhea intermittently. Because of the discomfort, he is here in the Emergency Room. In the Emergency Room, he was noted to have mild elevation of temperature to 99.3. He was, however, not hypotensive or hypoxic. Hospice services were contacted for admission. He was also noted to have elevated creatinine to 1.62, which is new since his last discharge on 11/29/2013.  At that time, the patient's creatinine was 1.15. Hospitalist services were contacted for admission.   PAST MEDICAL HISTORY:  Significant for history of admission in 11/2013 for chest pains. He underwent a stress test at that point, which was negative. He was noted to be hyponatremic, also he was noted to be pancytopenic due to Revlimid. He was noted to have normal ejection fraction on echocardiogram; history of Afib in sinus rhythm in 11/2013 admission, was on Coumadin therapy, now he is off Coumadin due to subcutaneous bleeding; hypertension, hyperlipidemia, benign prostatic hypertrophy, history of gastrointestinal bleed in the past, peptic ulcer disease, multiple myeloma, which was FISH with 15q deletion, which was felt to be poor prognosis. Also, history of congestive heart failure in the past, history of COPD, gastroesophageal reflux disease, status post back surgery in 1985.   MEDICATIONS: According to medical records, the patient is on:  1.  Alprazolam 0.25 mg p.o. twice daily as needed.  2.  Dexamethasone 6 mg 5 tablets once weekly.  3.  Enalapril 10 mg p.o. twice daily.  4.  Furosemide 10 mg p.o. daily.  5.  Lovastatin 40 mg p.o. daily.  6.  Magnesium oxide 400 mg p.o. twice daily.  7.  Metformin 500 mg p.o. 2 tablets once daily.  8.  Metoprolol 25 mg p.o. once daily, extended released metoprolol.  9.  Revlimid 25 mg p.o. daily.  10.  Tikosyn 500 mcg p.o. twice daily.   Allergies: aspirin, Celebrex, as well as nonsteroidal anti-inflammatory medications due to history of GI bleed.     SOCIAL HISTORY: No smoking, alcohol abuse. Lives with his wife at home.   FAMILY HISTORY: The patient's father had heart disease.   REVIEW OF SYSTEMS:   multiple complaints  including feeling feverish and chilly, fatigue and weak for the past few days, weight loss, approximately 12 pounds since 6 weeks ago, bilateral cataracts, cold hands, yellow phlegm with cough, as well as significant wheezing, as well as shortness of breath and intermittent arrhythmias; also hemoptysis whenever he moves around. Admits of some throat pain, recent   otitis media with ruptured right tympanic membrane, feeling sweaty, having chest pains radiating to left arm, as well as shortness of breath, very poor p.o. intake, as well as intermittent diarrhea. Otherwise, denies any significant pains, weight gain.  EYES: In regards to eyes, denies any blurry vision, double vision or glaucoma.  ENT: Denies any tinnitus, allergies, epistaxis, sinus pain, dentures, difficulty swallowing. RESPIRATORY: Admits to postnasal drip every night. Denies any asthma or COPD. CARDIOVASCULAR: Denies any arrhythmias, palpitations or syncope. GASTROINTESTINAL: Denies nausea, vomiting, abdominal pain, rectal bleeding or change in bowel habits.  GENITOURINARY: Denies dysuria, hematuria, frequency or incontinence. ENDOCRINOLOGY: Denies any polydipsia, nocturia, thyroid problems, heat or cold intolerance or thirst.  HEMATOLOGIC: Denies anemia, easy bruising, bleeding or swollen glands.  SKIN: Denies any acne, rashes, lesions or change in moles.  MUSCULOSKELETAL: Denies arthritis, cramps or swelling.  NEUROLOGICAL: Denies numbness, epilepsy or tremor.  PSYCHIATRIC: Denies anxiety, insomnia or depression.    PHYSICAL EXAMINATION:  VITAL SIGNS: On arrival to the hospital, temperature was 99.3, pulse 85, respirations 11, blood pressure 116/63, saturation 97% on room air.  GENERAL: This is a well-developed, well-nourished African American male in no significant distress, lying on the stretcher.  HEENT: His pupils are equal and reactive to light. Extraocular movements are intact.  No icterus or conjunctivitis. The patient does have difficulty hearing. No pharyngeal erythema. Mucosa is moist. The patient does have mild redness in his throat on the right side of the throat.  Otherwise, unremarkable.  No lymphadenopathy was noted in submandibular or in neck area. NECK: No masses, supple, nontender. Thyroid is not enlarged. No adenopathy. No JVD or carotid bruits bilaterally. Full range of  motion.  LUNGS: Clear to auscultation in all fields. No rales, rhonchi, diminished breath sounds or wheezing. No labored inspirations, increased effort, dullness to percussion or overt respiratory distress.  CARDIOVASCULAR: S1, S2 present. Rhythm is regular. PMI not lateralized. Chest is nontender to palpation. Pedal pulses 1+. No lower extremity edema, calf tenderness or cyanosis was noted.  ABDOMEN: Soft and nontender. Bowel sounds are present. No hepatosplenomegaly or masses were noted.  RECTAL: Deferred.  MUSCULOSKELETAL: Muscle strength: Able to move all extremities. No cyanosis, degenerative joint disease or kyphosis. Gait is not tested.  SKIN:  Did not reveal any rashes, lesions, erythema, nodularity, induration. It was warm and dry to palpation.  LYMPHATICS:  No adenopathy in the cervical region.  NEUROLOGICAL: Cranial nerves are grossly  intact. Sensory is intact. No dysarthria or aphasia. The patient is alert, oriented to time, person, place, cooperative. Memory is good.  PSYCHIATRIC: No significant confusion, agitation or depression noted.     LABORATORY DATA: BMP showed BUN and creatinine of 21 and 1.62, respectively. Sodium 134, glucose 103. Otherwise, BMP was unremarkable. The patient's albumin level of 2.8, down from 2.9 on the 12/10/2013. Otherwise, liver enzymes were unremarkable. Cardiac enzymes: First set negative. CBC: White blood cell count 5.7, hemoglobin 8.8, platelet count 116.  Urinalysis:  Yellow cloudy urine. Negative for glucose, bilirubin or ketones. Specific gravity 1.017, pH was 5.0, negative for blood, 30 mg/dL of protein, negative for nitrites or leukocyte esterase, 2 red blood cells, 1 white  blood cells, trace bacteria. Lactic acid level is elevated at 2.3.   RADIOLOGIC STUDIES: Chest x-ray PA and lateral, 12/17/2013, revealed stable COPD, but no active disease.   ASSESSMENT AND PLAN: 1.  Fatigue, weakness. Unclear etiology. Could be Revlimid related; however, the  patient was recently ruled out for coronary artery disease. Cannot rule out infection. We will get cardiologist involved for further recommendations. We will get also cardiac catheterization results from 2 years ago from [facility]. We will get blood cultures and we will start the patient on Levaquin. We will have a CT scan of the sinuses done to rule out significant inflammation.  2.  Chest pain of unclear etiology, could be related to his chronic obstructive pulmonary disease. We will continue nebulizers as well as inhalers. Get d-dimer checked and get CT scan of chest  done with IV contrast to rule out pulmonary embolism, if d-dimer is up. The patient's oxygenation, however, is unremarkable on room air at rest.  3.  Suspected sinus infection and sinusitis. We will get CT scan of the paranasal sinuses checked due to recent otitis media and ongoing inflammation. I am concerned about ongoing infection. We will initiate antibiotic therapy orally and follow culture results.  4. (Dictation Anomaly) atrial fibrillation. We will continue the patient on his outpatient medications.  5.  History of gastroesophageal reflux disease. We will continue PPIs and we will follow the patient's oral intake.   TIME SPENT:  50 minutes.    ____________________________ Theodoro Grist, MD rv:dmm D: 12/17/2013 20:21:00 ET T: 12/17/2013 21:54:29 ET JOB#: [id number]  cc: Sena Hitch, MD Theodoro Grist, MD, <Dictator> [place] MD ELECTRONICALLY SIGNED 01/03/2014 16:26

## 2014-10-05 NOTE — Op Note (Signed)
PATIENT NAME:  Peter Walters, STAVOLA MR#:  [id number] DATE OF BIRTH:  Dec 29, 1935  DATE OF PROCEDURE:  12/31/2013  PREOPERATIVE DIAGNOSIS:  Multiple myeloma with poor venous access.   POSTOPERATIVE DIAGNOSIS:  Multiple myeloma with poor venous access.   PROCEDURES:  1.  Ultrasound guidance for vascular access, right internal jugular vein.  2.  Fluoroscopic guidance for placement of catheter.  3.  Placement of CT compatible Port-A-Cath, right internal jugular vein.   SURGEON:  Dr. Lucky Cowboy.   ANESTHESIA:  Local with moderate conscious sedation.   FLUOROSCOPY TIME:  Less than 1 minute.   CONTRAST:  Zero.   ESTIMATED BLOOD LOSS:  Minimal.   INDICATION FOR PROCEDURE: A 79 year old gentleman with multiple myeloma who needs a Port-A-Cath for chemotherapy and durable venous access and we are asked to place this. Risks and benefits were discussed. Informed consent was obtained.    DESCRIPTION OF THE PROCEDURE:  The patient was brought to the vascular and interventional radiology suite. The right neck and chest were sterilely prepped and draped, and a sterile surgical field was created. Ultrasound was used to help visualize a patent right internal jugular vein. This was then accessed under direct ultrasound guidance without difficulty with a Seldinger needle and a permanent image was recorded. A J-wire was placed. After skin nick and dilatation, the peel-away sheath was then placed over the wire. I then anesthetized an area under the clavicle approximately 2 fingerbreadths. A transverse incision was created and an inferior pocket was created with electrocautery and blunt dissection. The port was then brought onto the field, placed into the pocket and secured to the chest wall with 2 Prolene sutures. The catheter was connected to the port and tunneled from the subclavicular incision to the access site. Fluoroscopic guidance was used to cut the catheter to an appropriate length. The catheter was then placed through  the peel-away sheath and the peel-away sheath was removed. The catheter tip was parked in excellent location in the distal superior vena cava just above the right atrium. The pocket was then irrigated with antibiotic-impregnated saline and the wound was closed with a running 3-0 Vicryl and a 4-0 Monocryl. The access incision was closed with a single 4-0 Monocryl. The Huber needle was used to withdraw blood and flush the port with heparinized saline. Dermabond was then placed as a dressing. The patient tolerated the procedure well and was taken to the recovery room in stable condition.    ____________________________ Algernon Huxley, MD jsd:lt D: 12/31/2013 15:41:30 ET T: 01/01/2014 00:42:53 ET JOB#: [id number]  cc: Algernon Huxley, MD, <Dictator> Algernon Huxley MD ELECTRONICALLY SIGNED 01/22/2014 14:08

## 2014-10-05 NOTE — Consult Note (Signed)
General Aspect 79 year old male with a known history of multiple myeloma, followed by [facility], history of atrial fibrillation( details unavailable), on Coumadin, coronary artery disease with prior intervention per the patient and GERD, who presents with chest pain and shortness of breath on and off for the last 2 to 3 months. cardiology was consult in for angina.  reports that symptoms have been getting progressively worse over the past several weeks, now to the extent that he has severe symptoms with his daily activities. his wife has been concerned. He reports symptoms are similar to prior anginal pain several years ago that was treated at [facility]. He had a cardiac catheterization with possible intervention.   in the ED, he Had left-sided chest pain, not radiating, about 6 out of 10 in severity.  He is unable to walk across the room without getting short of breath and chest pain. He denies any fever, cough or palpitation. He also feels sweaty at times.   PAST MEDICAL HISTORY: 1.  Atrial fibrillation, on Coumadin.  2.  Hypertension.  3.  Benign prostatic hypertrophy.  4.  History of gastrointestinal bleed and ulcers, has not been taking any nonsteroidals, aspirin or Celebrex since then.  5.  Hyperlipidemia.  6.  History of multiple myeloma.  7.  Coronary artery disease, prior intervention at [facility] several years ago, details unavailable   ALLERGIES:  ASPIRIN, CELEBREX, AND NONSTEROIDALS MAINLY BECAUSE OF HISTORY OF GI BLEED.   SOCIAL HISTORY: No smoking. No alcohol. He lives at home.   FAMILY HISTORY: Father had heart disease.   Physical Exam:  GEN well developed, well nourished, no acute distress   HEENT hearing intact to voice, moist oral mucosa   NECK supple   RESP normal resp effort  clear BS   CARD Regular rate and rhythm  No murmur   ABD denies tenderness  soft   LYMPH negative neck   EXTR negative edema   SKIN normal to palpation   NEURO motor/sensory  function intact   PSYCH alert, A+O to time, place, person, good insight   Review of Systems:  Subjective/Chief Complaint chest pain, shortness of breath   General: Fatigue   Skin: No Complaints   ENT: No Complaints   Eyes: No Complaints   Neck: No Complaints   Respiratory: Short of breath   Cardiovascular: Chest pain or discomfort   Gastrointestinal: No Complaints   Genitourinary: No Complaints   Vascular: No Complaints   Musculoskeletal: No Complaints   Neurologic: No Complaints   Hematologic: No Complaints   Endocrine: No Complaints   Psychiatric: No Complaints   Review of Systems: All other systems were reviewed and found to be negative   Medications/Allergies Reviewed Medications/Allergies reviewed     multiple myeloma:    Arrythmias:    Hypercholesterolemia:    gerd:    ulcers:    enlarged prostate:    htn:    Back Surgery: 1985       Admit Diagnosis:   CHEST PAIN: Onset Date: 28-Nov-2013, Status: Active, Description: CHEST PAIN  Home Medications: Medication Instructions Status  Revlimid 25 mg oral capsule 1 cap(s) orally once a day for 21 days with 7 days off. Active  furosemide 20 mg oral tablet 1 tab(s) orally once a day Active  Tikosyn 500 mcg oral capsule 1 cap(s) orally 2 times a day Active  enalapril 20 mg oral tablet 1 tab(s) orally 2 times a day Active  metoprolol 25 mg oral tablet, extended release 1  tab(s) orally once a day Active  Coumadin 1 mg oral tablet 2.5 tab(s) orally once a day Active  metformin 500 mg oral tablet 2 tab(s) orally once a day Active  magnesium oxide 400 mg oral tablet 1 tab(s) orally 2 times a day Active  potassium chloride 10 mEq oral tablet, extended release 1 tab(s) orally once a day Active  lovastatin 40 mg oral tablet, extended release 1 tab(s) orally once a day Active   Lab Results:  Hepatic:  17-Jun-15 10:17   Bilirubin, Total  1.1  Alkaline Phosphatase 75 (45-117 NOTE: New Reference  Range 05/04/13)  SGPT (ALT) 34  SGOT (AST) 28  Total Protein, Serum  9.7  Albumin, Serum 3.4  Routine Chem:  17-Jun-15 10:17   Magnesium, Serum 2.1 (1.8-2.4 THERAPEUTIC RANGE: 4-7 mg/dL TOXIC: > 10 mg/dL  -----------------------)  Glucose, Serum  130  BUN 17  Creatinine (comp) 1.27  Sodium, Serum  134  Potassium, Serum 3.7  Chloride, Serum 104  CO2, Serum 24  Calcium (Total), Serum  8.1  Osmolality (calc) 272  eGFR (African American) >60  eGFR (Non-African American)  55 (eGFR values <17m/min/1.73 m2 may be an indication of chronic kidney disease (CKD). Calculated eGFR is useful in patients with stable renal function. The eGFR calculation will not be reliable in acutely ill patients when serum creatinine is changing rapidly. It is not useful in  patients on dialysis. The eGFR calculation may not be applicable to patients at the low and high extremes of body sizes, pregnant women, and vegetarians.)  Anion Gap  6  Cardiac:  17-Jun-15 10:17   Troponin I < 0.02 (0.00-0.05 0.05 ng/mL or less: NEGATIVE  Repeat testing in 3-6 hrs  if clinically indicated. >0.05 ng/mL: POTENTIAL  MYOCARDIAL INJURY. Repeat  testing in 3-6 hrs if  clinically indicated. NOTE: An increase or decrease  of 30% or more on serial  testing suggests a  clinically important change)  CK, Total 129 (39-308 NOTE: NEW REFERENCE RANGE  07/16/2013)  CPK-MB, Serum 1.1 (Result(s) reported on 28 Nov 2013 at 11:04AM.)  Routine Coag:  17-Jun-15 10:17   Prothrombin  23.4  INR 2.1 (INR reference interval applies to patients on anticoagulant therapy. A single INR therapeutic range for coumarins is not optimal for all indications; however, the suggested range for most indications is 2.0 - 3.0. Exceptions to the INR Reference Range may include: Prosthetic heart valves, acute myocardial infarction, prevention of myocardial infarction, and combinations of aspirin and anticoagulant. The need for a higher or  lower target INR must be assessed individually. Reference: The Pharmacology and Management of the Vitamin K  antagonists: the seventh ACCP Conference on Antithrombotic and Thrombolytic Therapy. CNIOEV.0350Sept:126 (3suppl): [id number] A HCT value >55% may artifactually increase the PT.  In one study,  the increase was an average of 25%. Reference:  "Effect on Routine and Special Coagulation Testing Values of Citrate Anticoagulant Adjustment in Patients with High HCT Values." American Journal of Clinical Pathology 2006;126:400-405.)  Routine Hem:  17-Jun-15 10:17   WBC (CBC)  3.5  RBC (CBC)  2.87  Hemoglobin (CBC)  9.4  Hematocrit (CBC)  27.9  Platelet Count (CBC)  51 (Result(s) reported on 28 Nov 2013 at 10:51AM.)  MCV 97  MCH 32.9  MCHC 33.9  RDW  17.2   EKG:  Interpretation EKG shows normal sinus rhythm with nonspecific ST and T wave abnormality   Radiology Results: XRay:    17-Jun-15 12:14, Chest PA and Lateral  Chest PA  and Lateral   REASON FOR EXAM:    chest pain  COMMENTS:       PROCEDURE: DXR - DXR CHEST PA (OR AP) AND LATERAL  - Nov 28 2013 12:14PM     CLINICAL DATA:  Chest pain, short of breath    EXAM:  CHEST  2 VIEW    COMPARISON:  Radiograph 03/03/2013    FINDINGS:  Normal mediastinum and cardiac silhouette. Normal pulmonary  vasculature. No evidence of effusion, infiltrate, or pneumothorax.  Hyperinflated lungs. No acute bony abnormality.     IMPRESSION:  Hyperinflated lungs.  No acute cardiopulmonary process.      Electronically Signed    By: Suzy Bouchard M.D.    On: 11/28/2013 12:37         Verified By: Rennis Golden, M.D.,    Aspirin: Unknown  Celebrex: Bleeding  Vital Signs/Nurse's Notes: **Vital Signs.:   17-Jun-15 13:10  Vital Signs Type Admission  Temperature Temperature (F) 97.5  Celsius 36.3  Temperature Source oral  Pulse Pulse 53  Respirations Respirations 18  Systolic BP Systolic BP 216  Diastolic BP (mmHg)  Diastolic BP (mmHg) 76  Mean BP 98  Pulse Ox % Pulse Ox % 100  Pulse Ox Activity Level  At rest  Oxygen Delivery Room Air/ 21 %    Impression 1) chest pain/angina Long discussion with patient about his prior CAD, prior intervention we will obtain records. he reports that he is unable to tolerate the stress test/treadmill. From there he went to catheterization at [facility] cardiac enzymes negative x1 notes indicate family prefers that he not be on aspirin given history of GI bleed and ulcers after a discussion about his various options for management, he is very concerned and would prefer cardiac catheterization. This was discussed with his wife as well. Plan is for cardiac cath in AM as sx are similar to his prior anginal sx. They are aware of risk and benefits.  2.  History of atrial fibrillation,  on Coumadin.   warfarin on hold for cath tomorrow (or friday if INR elevated)  3.  History of heart failure. 2-D echocardiogram ordered, have daily weights.  Strict ins and outs. T Will hold lasix for now give possible cath tomorrow  4.  Hypertension.  continue metoprolol could hold enalpril for possible cath  5) Multiple Myeloma:  on chemotherapy, pancytopenia Management per oncology   Electronic Signatures: Ida Rogue (MD)  (Signed 17-Jun-15 18:05)  Authored: General Aspect/Present Illness, History and Physical Exam, Review of System, Past Medical History, Health Issues, Home Medications, Labs, EKG , Radiology, Allergies, Vital Signs/Nurse's Notes, Impression/Plan   Last Updated: 17-Jun-15 18:05 by Ida Rogue (MD)

## 2014-10-07 LAB — COMPREHENSIVE METABOLIC PANEL WITH GFR
Albumin: 3.4 g/dL — ABNORMAL LOW
Alkaline Phosphatase: 58 U/L
Anion Gap: 4 — ABNORMAL LOW
BUN: 36 mg/dL — ABNORMAL HIGH
Bilirubin,Total: 0.7 mg/dL
Calcium, Total: 8.7 mg/dL — ABNORMAL LOW
Chloride: 102 mmol/L
Co2: 19 mmol/L — ABNORMAL LOW
Creatinine: 1.8 mg/dL — ABNORMAL HIGH
EGFR (African American): 41 — ABNORMAL LOW
EGFR (Non-African Amer.): 36 — ABNORMAL LOW
Glucose: 342 mg/dL — ABNORMAL HIGH
Potassium: 5.1 mmol/L
SGOT(AST): 21 U/L
SGPT (ALT): 20 U/L
Sodium: 125 mmol/L — ABNORMAL LOW
Total Protein: >12 g/dL — ABNORMAL HIGH

## 2014-10-07 LAB — CBC CANCER CENTER
Basophil #: 0 x10 3/mm (ref 0.0–0.1)
Basophil %: 0.8 %
EOS ABS: 0.1 x10 3/mm (ref 0.0–0.7)
Eosinophil %: 3.9 %
HCT: 25.7 % — ABNORMAL LOW (ref 40.0–52.0)
HGB: 8.7 g/dL — ABNORMAL LOW (ref 13.0–18.0)
LYMPHS ABS: 1.8 x10 3/mm (ref 1.0–3.6)
Lymphocyte %: 52.3 %
MCH: 31 pg (ref 26.0–34.0)
MCHC: 34 g/dL (ref 32.0–36.0)
MCV: 91 fL (ref 80–100)
Monocyte #: 0.3 x10 3/mm (ref 0.2–1.0)
Monocyte %: 8.5 %
NEUTROS ABS: 1.2 x10 3/mm — AB (ref 1.4–6.5)
Neutrophil %: 34.5 %
Platelet: 27 x10 3/mm — CL (ref 150–440)
RBC: 2.82 10*6/uL — ABNORMAL LOW (ref 4.40–5.90)
RDW: 18.9 % — AB (ref 11.5–14.5)
WBC: 3.5 x10 3/mm — ABNORMAL LOW (ref 3.8–10.6)

## 2014-10-07 LAB — MAGNESIUM: Magnesium: 1.8 mg/dL

## 2014-10-08 LAB — PROT IMMUNOELECTROPHORES(ARMC)

## 2014-10-13 ENCOUNTER — Emergency Department
Admission: EM | Admit: 2014-10-13 | Discharge: 2014-10-13 | Disposition: A | Discharge status: Home / Self Care | Payer: Medicare HMO | Attending: Internal Medicine | Admitting: Internal Medicine

## 2014-10-13 ENCOUNTER — Other Ambulatory Visit: Payer: Self-pay

## 2014-10-13 ENCOUNTER — Encounter: Payer: Self-pay | Admitting: Emergency Medicine

## 2014-10-13 ENCOUNTER — Emergency Department: Payer: Medicare HMO

## 2014-10-13 DIAGNOSIS — R1032 Left lower quadrant pain: Secondary | ICD-10-CM | POA: Diagnosis not present

## 2014-10-13 DIAGNOSIS — E871 Hypo-osmolality and hyponatremia: Secondary | ICD-10-CM | POA: Insufficient documentation

## 2014-10-13 DIAGNOSIS — C9000 Multiple myeloma not having achieved remission: Secondary | ICD-10-CM

## 2014-10-13 DIAGNOSIS — R197 Diarrhea, unspecified: Secondary | ICD-10-CM | POA: Diagnosis not present

## 2014-10-13 DIAGNOSIS — R109 Unspecified abdominal pain: Secondary | ICD-10-CM | POA: Diagnosis present

## 2014-10-13 DIAGNOSIS — Z79899 Other long term (current) drug therapy: Secondary | ICD-10-CM | POA: Insufficient documentation

## 2014-10-13 DIAGNOSIS — D696 Thrombocytopenia, unspecified: Secondary | ICD-10-CM | POA: Insufficient documentation

## 2014-10-13 DIAGNOSIS — R1110 Vomiting, unspecified: Secondary | ICD-10-CM | POA: Diagnosis not present

## 2014-10-13 DIAGNOSIS — Z794 Long term (current) use of insulin: Secondary | ICD-10-CM | POA: Insufficient documentation

## 2014-10-13 DIAGNOSIS — Z7901 Long term (current) use of anticoagulants: Secondary | ICD-10-CM | POA: Insufficient documentation

## 2014-10-13 DIAGNOSIS — I10 Essential (primary) hypertension: Secondary | ICD-10-CM | POA: Insufficient documentation

## 2014-10-13 LAB — COMPREHENSIVE METABOLIC PANEL
ALBUMIN: 2.7 g/dL — AB (ref 3.5–5.0)
ALT: 17 U/L (ref 17–63)
ALT: 16 U/L — AB (ref 17–63)
AST: 21 U/L (ref 15–41)
AST: 18 U/L (ref 15–41)
Albumin: 3.2 g/dL — ABNORMAL LOW (ref 3.5–5.0)
Alkaline Phosphatase: 49 U/L (ref 38–126)
Alkaline Phosphatase: 48 U/L (ref 38–126)
Anion gap: 4 — ABNORMAL LOW (ref 5–15)
Anion gap: 4 — ABNORMAL LOW (ref 5–15)
BILIRUBIN TOTAL: 0.9 mg/dL (ref 0.3–1.2)
BUN: 47 mg/dL — ABNORMAL HIGH (ref 6–20)
BUN: 49 mg/dL — ABNORMAL HIGH (ref 6–20)
CHLORIDE: 104 mmol/L (ref 101–111)
CO2: 17 mmol/L — AB (ref 22–32)
CO2: 20 mmol/L — ABNORMAL LOW (ref 22–32)
CREATININE: 2.03 mg/dL — AB (ref 0.61–1.24)
Calcium: 8.7 mg/dL — ABNORMAL LOW (ref 8.9–10.3)
Calcium: 8.3 mg/dL — ABNORMAL LOW (ref 8.9–10.3)
Chloride: 104 mmol/L (ref 101–111)
Creatinine, Ser: 2.33 mg/dL — ABNORMAL HIGH (ref 0.61–1.24)
GFR calc Af Amer: 30 mL/min — ABNORMAL LOW (ref >60)
GFR, EST AFRICAN AMERICAN: 35 mL/min — AB (ref >60)
GFR, EST NON AFRICAN AMERICAN: 30 mL/min — AB (ref >60)
GFR, EST NON AFRICAN AMERICAN: 26 mL/min — AB (ref >60)
Glucose, Bld: 298 mg/dL — ABNORMAL HIGH (ref 65–99)
Glucose, Bld: 227 mg/dL — ABNORMAL HIGH (ref 65–99)
POTASSIUM: 5.3 mmol/L — AB (ref 3.5–5.1)
POTASSIUM: 5.6 mmol/L — AB (ref 3.5–5.1)
Sodium: 125 mmol/L — ABNORMAL LOW (ref 135–145)
Sodium: 128 mmol/L — ABNORMAL LOW (ref 135–145)
Total Bilirubin: 0.7 mg/dL (ref 0.3–1.2)
Total Protein: 11.9 g/dL — ABNORMAL HIGH (ref 6.5–8.1)
Total Protein: 10.8 g/dL — ABNORMAL HIGH (ref 6.5–8.1)

## 2014-10-13 LAB — CBC WITH DIFFERENTIAL/PLATELET
Basophils Absolute: 0 10*3/uL (ref 0–0.1)
Basophils Relative: 1 %
EOS PCT: 4 %
Eosinophils Absolute: 0.2 10*3/uL (ref 0–0.7)
HCT: 26 % — ABNORMAL LOW (ref 40.0–52.0)
Hemoglobin: 8.8 g/dL — ABNORMAL LOW (ref 13.0–18.0)
LYMPHS ABS: 2.2 10*3/uL (ref 1.0–3.6)
LYMPHS PCT: 47 %
MCH: 31.1 pg (ref 26.0–34.0)
MCHC: 34 g/dL (ref 32.0–36.0)
MCV: 91.5 fL (ref 80.0–100.0)
MONOS PCT: 6 %
Monocytes Absolute: 0.3 10*3/uL (ref 0.2–1.0)
NEUTROS PCT: 42 %
Neutro Abs: 1.9 10*3/uL (ref 1.4–6.5)
Platelets: 22 10*3/uL — CL (ref 150–440)
RBC: 2.85 MIL/uL — AB (ref 4.40–5.90)
RDW: 19.4 % — ABNORMAL HIGH (ref 11.5–14.5)
WBC: 4.6 10*3/uL (ref 3.8–10.6)

## 2014-10-13 LAB — URINALYSIS COMPLETE WITH MICROSCOPIC (ARMC ONLY)
BILIRUBIN URINE: NEGATIVE
Glucose, UA: 50 mg/dL — AB
Hgb urine dipstick: NEGATIVE
KETONES UR: NEGATIVE mg/dL
LEUKOCYTES UA: NEGATIVE
Nitrite: NEGATIVE
PROTEIN: 100 mg/dL — AB
RBC / HPF: NONE SEEN RBC/hpf (ref 0–5)
Specific Gravity, Urine: 1.018 (ref 1.005–1.030)
Squamous Epithelial / LPF: NONE SEEN
pH: 5 (ref 5.0–8.0)

## 2014-10-13 LAB — PROTIME-INR
INR: 1.51
Prothrombin Time: 18.4 seconds — ABNORMAL HIGH (ref 11.4–15.0)

## 2014-10-13 LAB — LIPASE, BLOOD: LIPASE: 40 U/L (ref 22–51)

## 2014-10-13 MED ORDER — MORPHINE SULFATE 4 MG/ML IJ SOLN
INTRAMUSCULAR | Status: AC
  Filled 2014-10-13: qty 1

## 2014-10-13 MED ORDER — SODIUM CHLORIDE 0.9 % IV SOLN
1000.0000 mL | INTRAVENOUS | Status: DC
  Administered 2014-10-13: 1000 mL via INTRAVENOUS

## 2014-10-13 MED ORDER — DICYCLOMINE HCL 20 MG PO TABS: 20.0000 mg | ORAL_TABLET | Freq: Four times a day (QID) | ORAL | Status: DC

## 2014-10-13 MED ORDER — MORPHINE SULFATE 4 MG/ML IJ SOLN
4.0000 mg | Freq: Once | INTRAMUSCULAR | Status: AC
  Administered 2014-10-13: 4 mg via INTRAVENOUS

## 2014-10-13 MED ORDER — IOHEXOL 240 MG/ML SOLN: 25.0000 mL | Freq: Once | INTRAMUSCULAR | Status: DC | PRN

## 2014-10-13 MED ORDER — DICYCLOMINE HCL 20 MG PO TABS: 20.0000 mg | ORAL_TABLET | Freq: Once | ORAL | Status: AC

## 2014-10-13 MED ORDER — ONDANSETRON HCL 4 MG/2ML IJ SOLN
INTRAMUSCULAR | Status: AC
  Filled 2014-10-13: qty 2

## 2014-10-13 MED ORDER — LOPERAMIDE HCL 2 MG PO TABS: 2.0000 mg | ORAL_TABLET | Freq: Four times a day (QID) | ORAL | Status: AC | PRN

## 2014-10-13 MED ORDER — ONDANSETRON HCL 4 MG/2ML IJ SOLN
4.0000 mg | Freq: Once | INTRAMUSCULAR | Status: AC
  Administered 2014-10-13: 4 mg via INTRAVENOUS

## 2014-10-13 MED ORDER — IOHEXOL 240 MG/ML SOLN
25.0000 mL | Freq: Once | INTRAMUSCULAR | Status: AC | PRN
  Administered 2014-10-13: 25 mL via ORAL

## 2014-10-13 MED ORDER — ONDANSETRON 8 MG PO TBDP: 8.0000 mg | ORAL_TABLET | Freq: Three times a day (TID) | ORAL | Status: AC | PRN

## 2014-10-13 MED ORDER — DICYCLOMINE HCL 20 MG PO TABS
ORAL_TABLET | ORAL | Status: AC
  Administered 2014-10-13: 20 mg
  Filled 2014-10-13: qty 1

## 2014-10-13 NOTE — Discharge Summary (Signed)
PATIENT NAME:  Peter Walters, Peter Walters MR#:  [id number] DATE OF BIRTH:  [birth year]  DATE OF ADMISSION:  09/18/2014 DATE OF DISCHARGE:  09/20/2014  ADMITTING DIAGNOSES:  1.  Weakness. 2.  Diarrhea. 3.  Hyperglycemia.   DISCHARGE DIAGNOSES:  1.  Weakness, felt to be multifactorial including diarrhea, acute renal failure, hyperglycemia.  2.  Diarrhea, felt to be due to chemotherapy-induced. Now resolved.  3.  Hyperglycemia.  The patient's metformin has been switched to glipizide due to possible metformin causing gastrointestinal symptoms. Blood sugars need to be better monitored at home for adjustment to his regimen.  4.  Acute renal failure due to dehydration, now resolved.  5.  Multiple myeloma per oncology.  6.  Chest pain, felt to be atypical, cardiac enzymes negative. No further symptoms.  7.  History of atrial fibrillation on Coumadin.  It was held during hospitalization due to INR being held. 8.  History of hyperlipidemia.  9.  (Dictation Anomaly) 10.  Benign prostatic hypertrophy.  11. Status post back surgery.  12. Status post cataract surgery.  13. Status post (Dictation Anomaly)  implant.  14. Status post right arm surgery. 15. History of exploratory laparoscopy.  16. Pancytopenia, likely related to his multiple myeloma and his chemotherapy.    CONSULTANTS: None.   PERTINENT LABS AND EVALUATIONS: Glucose 397, BUN 26, creatinine 1.44, sodium 125, potassium 4.1, chloride 97, CO2 of 25, calcium 8.2. LFTs showed a total protein 9.9, albumin 3.4. WBC 3.2, hemoglobin 9.3, platelet count 34,000.  INR on admission 5.2. INR on discharge, 2.5, stool for pathogens was negative. Clostridium difficile was negative.   HOSPITAL COURSE: Please refer to H and P done by me on admission. The patient is a 79 year old African American male with history of multiple myeloma who received a second cycle of Velcade recently. He presented with nausea and diarrhea, and generalized weakness. The patient was noted  to have acute renal failure. He was given IV fluids and was admitted for supportive care.  His stool studies were negative for Clostridium difficile and other bacteria. He was placed on Imodium, scheduled with the resolution of his diarrhea. He is doing much better in terms of her diarrhea standpoint.  His blood sugars were very elevated on admission, but now improved due to him having chronic diarrhea.  His metformin is discontinued to see if that would help.  He is started on glipizide. The patient's INR was also elevated on presentation, his Coumadin was held. Now he will be restarted on Coumadin tomorrow. He will need INR check Monday. At this time, he is stable for discharge.   DISCHARGE MEDICATIONS: Enalapril 20 at 1 tabs p.o. b.i.d., magnesium oxide 400 mg 1 tab p.o. b.i.d., atorvastatin 40 daily, doxazosin 2 mg daily, ranitidine 150 at 1 tabs p.o. b.i.d., dexamethasone 5 tablets once a week, Tikosyn 500 mcg 1 tablet p.o. every 12, Coumadin 7 mg 1 tab p.o. daily, metoprolol tartrate 25 at 1 tabs p.o. b.i.d., Protonix 40 daily, Imodium 15 mL every 6 p.r.n. as needed for diarrhea. Tylenol 650 every 4 p.r.n. for pain, cetirizine 10 daily as needed for allergies, glipizide 5 mg 1 tab p.o. b.i.d.   DIET: Low sodium, low fat, low cholesterol, carbohydrate-controlled diet.   ACTIVITY: As tolerated.   FOLLOW-UP:  Primary doctor in 1 to 2 weeks, oncology on Monday. The patient is told not to take his Coumadin today and keep a log of his blood glucose to take to his primary care provider.   TIME SPENT:  35 minutes on this discharge.     ____________________________ Lafonda Mosses. Posey Pronto, MD shp:DT D: 09/20/2014 12:30:12 ET T: 09/20/2014 13:22:27 ET JOB#: [id number]  cc: Teddrick Mallari H. Posey Pronto, MD, <Dictator> Alric Seton MD ELECTRONICALLY SIGNED 09/25/2014 12:15

## 2014-10-13 NOTE — ED Provider Notes (Addendum)
[facility] Emergency Department Provider Note    ____________________________________________  Time seen: 1:15  I have reviewed the triage vital signs and the nursing notes.   HISTORY  Chief Complaint Abdominal Pain; Emesis; and Diarrhea       HPI HIEP OLLIS is a 79 y.o. male with a chief complaint of abdominal pain, vomiting, and diarrhea. Onset of symptoms last evening. Pain is in the left lower quadrant of the abdomen but spreads diffusely all over the abdomen. Pain is described as sharp.  Has been constant, Pain started last evening.  Pain is associated with nausea and vomiting. He vomited 2 times since last evening. Also complains of diarrhea. He had diarrhea 3 times since last evening he does not have any associated fever. The pain is rated as 6 out of 10 or moderate in severity.  it is constant it is sharp and aching No new Medications or activities. he is a diabetic and he was able to eat applesauce and a Glucerna this morning.  he did take his insulin at 7 PM last night and he did take his glipizide this morning although he was not able to eat much today.  he does have associated weakness.      Past Medical History  Diagnosis Date  . A-fib   . Hypertension   . Benign prostatic hypertrophy   . History of gastrointestinal bleeding   . Hyperlipidemia   . History of multiple myeloma   . CAD (coronary artery disease)   . GERD (gastroesophageal reflux disease)   . CHF (congestive heart failure)   . Peptic ulcer disease   . Hyponatremia     There are no active problems to display for this patient.   Past Surgical History  Procedure Laterality Date  . Cardiac catheterization    . Coronary angioplasty      [facility]    Current Outpatient Rx  Name  Route  Sig  Dispense  Refill  . atorvastatin (LIPITOR) 40 MG tablet   Oral   Take 40 mg by mouth at bedtime.         . dofetilide (TIKOSYN) 500 MCG capsule   Oral    Take 500 mcg by mouth every 12 (twelve) hours.          Marland Kitchen doxazosin (CARDURA) 2 MG tablet   Oral   Take 2 mg by mouth at bedtime.         . enalapril (VASOTEC) 20 MG tablet   Oral   Take 20 mg by mouth 2 (two) times daily.          Marland Kitchen glipiZIDE (GLUCOTROL XL) 10 MG 24 hr tablet   Oral   Take 10 mg by mouth 2 (two) times daily.         . insulin detemir (LEVEMIR) 100 UNIT/ML injection   Subcutaneous   Inject 15 Units into the skin at bedtime.         . magnesium oxide (MAG-OX) 400 MG tablet   Oral   Take 400 mg by mouth 2 (two) times daily.         . metoprolol tartrate (LOPRESSOR) 25 MG tablet   Oral   Take 12.5 mg by mouth 2 (two) times daily.          . pantoprazole (PROTONIX) 40 MG tablet   Oral   Take 40 mg by mouth daily.         . ranitidine (ZANTAC) 150 MG  tablet   Oral   Take 150 mg by mouth 2 (two) times daily.         Marland Kitchen warfarin (COUMADIN) 1 MG tablet   Oral   Take 2 mg by mouth at bedtime.         Marland Kitchen warfarin (COUMADIN) 5 MG tablet   Oral   Take 5 mg by mouth at bedtime.         Marland Kitchen loperamide (IMODIUM A-D) 2 MG tablet   Oral   Take 1 tablet (2 mg total) by mouth 4 (four) times daily as needed for diarrhea or loose stools.   30 tablet   0   . ondansetron (ZOFRAN-ODT) 8 MG disintegrating tablet   Oral   Take 1 tablet (8 mg total) by mouth every 8 (eight) hours as needed for nausea.   30 tablet   0     Allergies Aspirin and Celecoxib  Family History  Problem Relation Age of Onset  . Heart disease Father     Social History History  Substance Use Topics  . Smoking status: Never Smoker   . Smokeless tobacco: Not on file  . Alcohol Use: No    Review of Systems  Constitutional: Negative for fever. Eyes: Negative for visual changes. ENT: Negative for sore throat. Cardiovascular: Negative for chest pain. Respiratory: Negative for shortness of breath. Gastrointestinal: Positive for abdominal pain vomiting and  diarrhea. Genitourinary: Negative for dysuria. Musculoskeletal: Negative for back pain. Skin: Negative for rash. Neurological: Negative for headaches, focal weakness or numbness.   10-point ROS otherwise negative.  ____________________________________________   PHYSICAL EXAM:  VITAL SIGNS: ED Triage Vitals  Enc Vitals Group     BP 10/13/14 1209 113/59 mmHg     Pulse Rate 10/13/14 1209 84     Resp 10/13/14 1209 18     Temp 10/13/14 1209 98.7 F (37.1 C)     Temp Source 10/13/14 1209 Oral     SpO2 10/13/14 1209 100 %     Weight 10/13/14 1209 163 lb (73.936 kg)     Height 10/13/14 1209 5' 8"  (1.727 m)     Head Cir --      Peak Flow --      Pain Score 10/13/14 1210 6     Pain Loc --      Pain Edu? --      Excl. in [place]? --      Constitutional: Alert and oriented. Well appearing and in no distress. Eyes: Conjunctivae are normal. PERRL. Normal extraocular movements. ENT   Head: Normocephalic and atraumatic.   Nose: No congestion/rhinnorhea.   Mouth/Throat: Mucous membranes are moist.   Neck: No stridor. Hematological/Lymphatic/Immunilogical: No cervical lymphadenopathy. Cardiovascular: Normal rate, regular rhythm. Normal and symmetric distal pulses are present in all extremities. No murmurs, rubs, or gallops. Respiratory: Normal respiratory effort without tachypnea nor retractions. Breath sounds are clear and equal bilaterally. No wheezes/rales/rhonchi. Gastrointestinal: Soft and tenderness in the left lower quadrant positive guarding no rebound.. No abdominal bruits. There is no CVA tenderness. Genitourinary: No abnormalities. Musculoskeletal: Nontender with normal range of motion in all extremities. No joint effusions.  No lower extremity tenderness nor edema. Neurologic:  Normal speech and language. No gross focal neurologic deficits are appreciated. Speech is normal. No gait instability. Skin:  Skin is warm, dry and intact. No rash noted. Psychiatric: Mood  and affect are normal. Speech and behavior are normal. Patient exhibits appropriate insight and judgment.  ____________________________________________   EKG  ED ECG  REPORT   Date: 10/13/2014  EKG Time start time.: 1221  Rate: 82 bpm  Rhythm: normal sinus rhythm, prolonged QT interval  Axis: Normal  Intervals:none  ST&T Change: None ____________________________________________    RADIOLOGY  CT of the abdomen was read by the radiologist and reveals multiple dilated loops of small bowel with surrounding mesenteric edema and fluid. This may be consistent with an inflammatory etiology.  ____________________________________________   PROCEDURES  Procedure(s) performed: None  Critical Care performed: No  ____________________________________________   INITIAL IMPRESSION / ASSESSMENT AND PLAN / ED COURSE  Pertinent labs & imaging results that were available during my care of the patient were reviewed by me and considered in my medical decision making (see chart for details).  Patient presents to the emergency department with abdominal pain, nausea, vomiting and diarrhea of one days duration. Patient will receive IV fluid in the emergency department. ----------------------------------------- 2:41 PM on 10/13/2014 -----------------------------------------  Labs reveal hyponatremia and renal insufficiency. Also low platelet count.  I have evaluated the previous labs and for the past month his sodium has ranged from 122-125. The platelet count has also been low. The patient has no obvious sign of bleeding. The patient is being treated for multiple myeloma and is seen by his oncologist on a regular basis for evaluation of his labs.  We'll continue hydration with IV fluid while awaiting CT results. ----------------------------------------- 5:25 PM on 10/13/2014 -----------------------------------------  CT results revealed edema and inflammation of the small bowel but no  acute signs of infection or obstruction  In the emergency department the patient has remained stable with no vomiting and no diarrhea.  He is able to tolerate ice chips in the emergency department.   Plan is to dismiss the patient home with antiemetics, Zofran and antidiarrheals, Imodium   Wife is to encourage by mouth fluids and light diet. Since the patient is on insulin and an oral anti-hyperglycemic his wife will check his sugars and continue to supply a glycerna if his appetite is poor. His wife has been instructed to cut the evening dose of insulin to one half the usual dose to 7 units instead of 15 units if he is only able to ingest Glucerna and is not N increasing his by mouth intake of foods. Bentyl as directed if needed for pain.  I consult with Dr. Maree Krabbe regarding the care of this patient. He has recommended that we check a PT/INR. If the PT is above 1.6 we will hold the dose of Coumadin tonight because of the low platelet count of 22,000. The patient is not bleeding from anywhere.  He is to follow up with the oncologist tomorrow for reevaluation and a repeat pt inr.  ____________________________________   FINAL CLINICAL IMPRESSION(S) / ED DIAGNOSES  Final diagnoses:  Vomiting and diarrhea  Hyponatremia  Thrombocytopenia  Multiple myeloma  1. Vomiting 2. Diarrhea 3. Hyponatremia 4.thrombocytopenia 5.. Multiple myeloma      Boris Lown, DO 10/13/14 2025  Boris Lown, DO 10/13/14 2027

## 2014-10-13 NOTE — Discharge Instructions (Signed)
Decrease insulin to 7 units tonight if not eating. Use bentyl if needed for pain and Zofran and imodium as directed for nausea or vomiting. See dr Grayland Ormond tomorrow for recheck on you labs . If you r condition worsens return to the er

## 2014-10-13 NOTE — ED Notes (Signed)
CRITICAL LAB RESULT CALLED TO THIS RN AT THIS TIME. PLT-22 MD MADE AWARE
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
## 2014-10-13 NOTE — ED Notes (Signed)
Patient returned from CT scan, family at bedside, NAD noted at this time.

## 2014-10-13 NOTE — ED Notes (Signed)
Pt with abd pain since yesterday. Pt has history of cancer. Pt states pain has not improved and wants to know what is going on

## 2014-10-13 NOTE — ED Notes (Signed)
Patient transported to CT 

## 2014-10-14 ENCOUNTER — Telehealth: Payer: Self-pay | Admitting: *Deleted

## 2014-10-14 DIAGNOSIS — D696 Thrombocytopenia, unspecified: Secondary | ICD-10-CM

## 2014-10-14 NOTE — Telephone Encounter (Signed)
Went to ER over weekend and was told to call Dr Grayland Ormond for lab recheck today

## 2014-10-14 NOTE — Telephone Encounter (Signed)
Come in tomorrow for lab only visit, IVF. CBC, PT/INR

## 2014-10-15 ENCOUNTER — Ambulatory Visit: Payer: Medicare HMO

## 2014-10-15 ENCOUNTER — Other Ambulatory Visit: Payer: Medicare HMO

## 2014-10-15 ENCOUNTER — Inpatient Hospital Stay: Payer: Medicare HMO

## 2014-10-15 ENCOUNTER — Inpatient Hospital Stay: Payer: Medicare HMO | Attending: Oncology

## 2014-10-15 VITALS — BP 174/73 | HR 71 | Temp 96.7°F | Resp 19

## 2014-10-15 DIAGNOSIS — D696 Thrombocytopenia, unspecified: Secondary | ICD-10-CM | POA: Insufficient documentation

## 2014-10-15 DIAGNOSIS — C9002 Multiple myeloma in relapse: Secondary | ICD-10-CM

## 2014-10-15 DIAGNOSIS — Z79899 Other long term (current) drug therapy: Secondary | ICD-10-CM | POA: Insufficient documentation

## 2014-10-15 DIAGNOSIS — I10 Essential (primary) hypertension: Secondary | ICD-10-CM | POA: Insufficient documentation

## 2014-10-15 DIAGNOSIS — D649 Anemia, unspecified: Secondary | ICD-10-CM | POA: Insufficient documentation

## 2014-10-15 DIAGNOSIS — C9000 Multiple myeloma not having achieved remission: Secondary | ICD-10-CM | POA: Insufficient documentation

## 2014-10-15 DIAGNOSIS — N189 Chronic kidney disease, unspecified: Secondary | ICD-10-CM | POA: Insufficient documentation

## 2014-10-15 DIAGNOSIS — E876 Hypokalemia: Secondary | ICD-10-CM | POA: Insufficient documentation

## 2014-10-15 LAB — PROTIME-INR
INR: 1.48
Prothrombin Time: 18.1 seconds — ABNORMAL HIGH (ref 11.4–15.0)

## 2014-10-15 LAB — CBC
HEMATOCRIT: 19.5 % — AB (ref 40.0–52.0)
HEMOGLOBIN: 6.7 g/dL — AB (ref 13.0–18.0)
MCH: 31.2 pg (ref 26.0–34.0)
MCHC: 34.2 g/dL (ref 32.0–36.0)
MCV: 91.3 fL (ref 80.0–100.0)
PLATELETS: 23 10*3/uL — AB (ref 150–440)
RBC: 2.14 MIL/uL — ABNORMAL LOW (ref 4.40–5.90)
RDW: 18.9 % — ABNORMAL HIGH (ref 11.5–14.5)
WBC: 3.4 10*3/uL — AB (ref 3.8–10.6)

## 2014-10-15 MED ORDER — SODIUM CHLORIDE 0.9 % IV SOLN
INTRAVENOUS | Status: DC
  Administered 2014-10-15: 15:00:00 via INTRAVENOUS
  Filled 2014-10-15: qty 1000

## 2014-10-15 MED ORDER — HEPARIN SOD (PORK) LOCK FLUSH 100 UNIT/ML IV SOLN
500.0000 [IU] | Freq: Once | INTRAVENOUS | Status: AC
  Administered 2014-10-15: 500 [IU] via INTRAVENOUS
  Filled 2014-10-15: qty 5

## 2014-10-17 ENCOUNTER — Other Ambulatory Visit: Payer: Self-pay | Admitting: Oncology

## 2014-10-17 ENCOUNTER — Encounter: Payer: Self-pay | Admitting: Oncology

## 2014-10-17 DIAGNOSIS — C9002 Multiple myeloma in relapse: Secondary | ICD-10-CM

## 2014-10-17 HISTORY — DX: Multiple myeloma in relapse: C90.02

## 2014-10-18 ENCOUNTER — Other Ambulatory Visit: Payer: Self-pay | Admitting: Oncology

## 2014-10-18 ENCOUNTER — Inpatient Hospital Stay
Admission: AD | Admit: 2014-10-18 | Discharge: 2014-10-24 | DRG: 683 | Disposition: A | Discharge status: Home / Self Care | Payer: Medicare HMO | Source: Ambulatory Visit | Attending: Hematology and Oncology | Admitting: Hematology and Oncology

## 2014-10-18 ENCOUNTER — Inpatient Hospital Stay: Payer: Medicare HMO | Admitting: Family Medicine

## 2014-10-18 ENCOUNTER — Other Ambulatory Visit: Payer: Self-pay

## 2014-10-18 ENCOUNTER — Inpatient Hospital Stay: Payer: Medicare HMO

## 2014-10-18 VITALS — BP 147/87 | HR 76 | Temp 98.5°F | Resp 18

## 2014-10-18 DIAGNOSIS — D61818 Other pancytopenia: Secondary | ICD-10-CM | POA: Diagnosis present

## 2014-10-18 DIAGNOSIS — C9002 Multiple myeloma in relapse: Secondary | ICD-10-CM

## 2014-10-18 DIAGNOSIS — T45515A Adverse effect of anticoagulants, initial encounter: Secondary | ICD-10-CM | POA: Diagnosis present

## 2014-10-18 DIAGNOSIS — E1121 Type 2 diabetes mellitus with diabetic nephropathy: Secondary | ICD-10-CM | POA: Diagnosis present

## 2014-10-18 DIAGNOSIS — I480 Paroxysmal atrial fibrillation: Secondary | ICD-10-CM

## 2014-10-18 DIAGNOSIS — N400 Enlarged prostate without lower urinary tract symptoms: Secondary | ICD-10-CM | POA: Diagnosis present

## 2014-10-18 DIAGNOSIS — I129 Hypertensive chronic kidney disease with stage 1 through stage 4 chronic kidney disease, or unspecified chronic kidney disease: Secondary | ICD-10-CM | POA: Diagnosis present

## 2014-10-18 DIAGNOSIS — R040 Epistaxis: Secondary | ICD-10-CM | POA: Diagnosis not present

## 2014-10-18 DIAGNOSIS — Z7901 Long term (current) use of anticoagulants: Secondary | ICD-10-CM

## 2014-10-18 DIAGNOSIS — Z955 Presence of coronary angioplasty implant and graft: Secondary | ICD-10-CM | POA: Diagnosis not present

## 2014-10-18 DIAGNOSIS — I4581 Long QT syndrome: Secondary | ICD-10-CM | POA: Diagnosis present

## 2014-10-18 DIAGNOSIS — Z79818 Long term (current) use of other agents affecting estrogen receptors and estrogen levels: Secondary | ICD-10-CM | POA: Diagnosis not present

## 2014-10-18 DIAGNOSIS — Z8673 Personal history of transient ischemic attack (TIA), and cerebral infarction without residual deficits: Secondary | ICD-10-CM

## 2014-10-18 DIAGNOSIS — N179 Acute kidney failure, unspecified: Secondary | ICD-10-CM | POA: Diagnosis present

## 2014-10-18 DIAGNOSIS — C9000 Multiple myeloma not having achieved remission: Secondary | ICD-10-CM

## 2014-10-18 DIAGNOSIS — Z8711 Personal history of peptic ulcer disease: Secondary | ICD-10-CM

## 2014-10-18 DIAGNOSIS — I10 Essential (primary) hypertension: Secondary | ICD-10-CM

## 2014-10-18 DIAGNOSIS — R531 Weakness: Secondary | ICD-10-CM

## 2014-10-18 DIAGNOSIS — Z8249 Family history of ischemic heart disease and other diseases of the circulatory system: Secondary | ICD-10-CM

## 2014-10-18 DIAGNOSIS — E785 Hyperlipidemia, unspecified: Secondary | ICD-10-CM | POA: Diagnosis present

## 2014-10-18 DIAGNOSIS — J309 Allergic rhinitis, unspecified: Secondary | ICD-10-CM | POA: Diagnosis present

## 2014-10-18 DIAGNOSIS — N183 Chronic kidney disease, stage 3 (moderate): Secondary | ICD-10-CM | POA: Diagnosis present

## 2014-10-18 DIAGNOSIS — E875 Hyperkalemia: Secondary | ICD-10-CM | POA: Diagnosis present

## 2014-10-18 DIAGNOSIS — Z791 Long term (current) use of non-steroidal anti-inflammatories (NSAID): Secondary | ICD-10-CM | POA: Diagnosis not present

## 2014-10-18 DIAGNOSIS — I509 Heart failure, unspecified: Secondary | ICD-10-CM | POA: Diagnosis present

## 2014-10-18 DIAGNOSIS — Z515 Encounter for palliative care: Secondary | ICD-10-CM | POA: Diagnosis not present

## 2014-10-18 DIAGNOSIS — Z79899 Other long term (current) drug therapy: Secondary | ICD-10-CM

## 2014-10-18 DIAGNOSIS — K219 Gastro-esophageal reflux disease without esophagitis: Secondary | ICD-10-CM | POA: Diagnosis present

## 2014-10-18 DIAGNOSIS — E872 Acidosis: Secondary | ICD-10-CM | POA: Diagnosis present

## 2014-10-18 DIAGNOSIS — D630 Anemia in neoplastic disease: Secondary | ICD-10-CM

## 2014-10-18 DIAGNOSIS — Z886 Allergy status to analgesic agent status: Secondary | ICD-10-CM | POA: Diagnosis not present

## 2014-10-18 DIAGNOSIS — Z794 Long term (current) use of insulin: Secondary | ICD-10-CM | POA: Diagnosis not present

## 2014-10-18 DIAGNOSIS — H540 Blindness, both eyes: Secondary | ICD-10-CM | POA: Diagnosis present

## 2014-10-18 DIAGNOSIS — I2510 Atherosclerotic heart disease of native coronary artery without angina pectoris: Secondary | ICD-10-CM | POA: Diagnosis present

## 2014-10-18 DIAGNOSIS — I4891 Unspecified atrial fibrillation: Secondary | ICD-10-CM | POA: Diagnosis present

## 2014-10-18 DIAGNOSIS — D696 Thrombocytopenia, unspecified: Secondary | ICD-10-CM | POA: Diagnosis not present

## 2014-10-18 DIAGNOSIS — D649 Anemia, unspecified: Secondary | ICD-10-CM | POA: Diagnosis not present

## 2014-10-18 DIAGNOSIS — R944 Abnormal results of kidney function studies: Secondary | ICD-10-CM | POA: Diagnosis not present

## 2014-10-18 DIAGNOSIS — R634 Abnormal weight loss: Secondary | ICD-10-CM | POA: Insufficient documentation

## 2014-10-18 DIAGNOSIS — E119 Type 2 diabetes mellitus without complications: Secondary | ICD-10-CM

## 2014-10-18 DIAGNOSIS — E860 Dehydration: Secondary | ICD-10-CM | POA: Diagnosis present

## 2014-10-18 HISTORY — DX: Malignant (primary) neoplasm, unspecified: C80.1

## 2014-10-18 HISTORY — DX: Cerebral infarction, unspecified: I63.9

## 2014-10-18 LAB — CBC WITH DIFFERENTIAL/PLATELET
Basophils Absolute: 0 10*3/uL (ref 0–0.1)
Basophils Relative: 1 %
Eosinophils Absolute: 0.2 10*3/uL (ref 0–0.7)
Eosinophils Relative: 5 %
HEMATOCRIT: 21.7 % — AB (ref 40.0–52.0)
HEMOGLOBIN: 7.4 g/dL — AB (ref 13.0–18.0)
Lymphs Abs: 1.7 10*3/uL (ref 1.0–3.6)
MCH: 31 pg (ref 26.0–34.0)
MCHC: 33.9 g/dL (ref 32.0–36.0)
MCV: 91.5 fL (ref 80.0–100.0)
MONO ABS: 0.2 10*3/uL (ref 0.2–1.0)
Monocytes Relative: 7 %
NEUTROS ABS: 1.5 10*3/uL (ref 1.4–6.5)
Neutrophils Relative %: 42 %
Platelets: 32 10*3/uL — ABNORMAL LOW (ref 150–440)
RBC: 2.37 MIL/uL — ABNORMAL LOW (ref 4.40–5.90)
RDW: 19 % — ABNORMAL HIGH (ref 11.5–14.5)
WBC: 3.7 10*3/uL — ABNORMAL LOW (ref 3.8–10.6)

## 2014-10-18 LAB — COMPREHENSIVE METABOLIC PANEL
ALT: 14 U/L — AB (ref 17–63)
AST: 19 U/L (ref 15–41)
Albumin: 3.1 g/dL — ABNORMAL LOW (ref 3.5–5.0)
Alkaline Phosphatase: 53 U/L (ref 38–126)
Anion gap: 4 — ABNORMAL LOW (ref 5–15)
BUN: 44 mg/dL — AB (ref 6–20)
CALCIUM: 8.4 mg/dL — AB (ref 8.9–10.3)
CO2: 18 mmol/L — ABNORMAL LOW (ref 22–32)
CREATININE: 2.77 mg/dL — AB (ref 0.61–1.24)
Chloride: 108 mmol/L (ref 101–111)
GFR calc Af Amer: 24 mL/min — ABNORMAL LOW (ref >60)
GFR, EST NON AFRICAN AMERICAN: 21 mL/min — AB (ref >60)
GLUCOSE: 161 mg/dL — AB (ref 65–99)
Potassium: 4.7 mmol/L (ref 3.5–5.1)
Sodium: 130 mmol/L — ABNORMAL LOW (ref 135–145)
Total Bilirubin: 0.7 mg/dL (ref 0.3–1.2)
Total Protein: >12 g/dL — ABNORMAL HIGH (ref 6.5–8.1)

## 2014-10-18 LAB — PROTIME-INR
INR: 1.59
Prothrombin Time: 19.1 seconds — ABNORMAL HIGH (ref 11.4–15.0)

## 2014-10-18 LAB — MAGNESIUM: MAGNESIUM: 2.1 mg/dL (ref 1.7–2.4)

## 2014-10-18 LAB — GLUCOSE, CAPILLARY
Glucose-Capillary: 219 mg/dL — ABNORMAL HIGH (ref 70–99)
Glucose-Capillary: 218 mg/dL — ABNORMAL HIGH (ref 70–99)

## 2014-10-18 MED ORDER — ALUM & MAG HYDROXIDE-SIMETH 200-200-20 MG/5ML PO SUSP: 60.0000 mL | ORAL | Status: DC | PRN

## 2014-10-18 MED ORDER — WARFARIN - PHYSICIAN DOSING INPATIENT
Freq: Every day | Status: DC
Start: 2014-10-19 — End: 2014-10-19

## 2014-10-18 MED ORDER — ACETAMINOPHEN 325 MG PO TABS
650.0000 mg | ORAL_TABLET | ORAL | Status: DC | PRN
  Administered 2014-10-23 – 2014-10-24 (×3): 650 mg via ORAL
  Filled 2014-10-18 (×3): qty 2

## 2014-10-18 MED ORDER — LORATADINE 10 MG PO TABS
10.0000 mg | ORAL_TABLET | Freq: Every day | ORAL | Status: DC
  Administered 2014-10-18 – 2014-10-24 (×7): 10 mg via ORAL
  Filled 2014-10-18 (×7): qty 1

## 2014-10-18 MED ORDER — SODIUM CHLORIDE 0.9 % IV SOLN
INTRAVENOUS | Status: DC
  Administered 2014-10-18 – 2014-10-24 (×10): via INTRAVENOUS

## 2014-10-18 MED ORDER — WARFARIN SODIUM 10 MG PO TABS
5.0000 mg | ORAL_TABLET | Freq: Every day | ORAL | Status: DC
  Administered 2014-10-18: 5 mg via ORAL
  Filled 2014-10-18: qty 1

## 2014-10-18 MED ORDER — ONDANSETRON HCL 4 MG PO TABS: 4.0000 mg | ORAL_TABLET | Freq: Three times a day (TID) | ORAL | Status: DC | PRN

## 2014-10-18 MED ORDER — ONDANSETRON HCL 4 MG/2ML IJ SOLN: 4.0000 mg | Freq: Three times a day (TID) | INTRAMUSCULAR | Status: DC | PRN

## 2014-10-18 MED ORDER — GLIPIZIDE ER 2.5 MG PO TB24
10.0000 mg | ORAL_TABLET | Freq: Two times a day (BID) | ORAL | Status: DC
  Administered 2014-10-18 – 2014-10-20 (×4): 10 mg via ORAL
  Filled 2014-10-18 (×4): qty 4

## 2014-10-18 MED ORDER — ENALAPRIL MALEATE 20 MG PO TABS
20.0000 mg | ORAL_TABLET | Freq: Two times a day (BID) | ORAL | Status: DC
  Administered 2014-10-18: 18:00:00 20 mg via ORAL
  Filled 2014-10-18 (×2): qty 1

## 2014-10-18 MED ORDER — LIDOCAINE-PRILOCAINE 2.5-2.5 % EX CREA
1.0000 "application " | TOPICAL_CREAM | Freq: Once | CUTANEOUS | Status: DC
  Filled 2014-10-18: qty 5

## 2014-10-18 MED ORDER — MEGESTROL ACETATE 40 MG/ML PO SUSP
40.0000 mg | Freq: Every day | ORAL | Status: DC
  Administered 2014-10-18: 18:00:00 19200 mg via ORAL
  Administered 2014-10-19 – 2014-10-20 (×2): 40 mg via ORAL
  Administered 2014-10-21: 10:00:00 via ORAL
  Administered 2014-10-22 – 2014-10-24 (×3): 40 mg via ORAL
  Filled 2014-10-18 (×9): qty 5

## 2014-10-18 MED ORDER — SENNOSIDES-DOCUSATE SODIUM 8.6-50 MG PO TABS: 1.0000 | ORAL_TABLET | Freq: Every evening | ORAL | Status: DC | PRN

## 2014-10-18 MED ORDER — DOFETILIDE 125 MCG PO CAPS
125.0000 ug | ORAL_CAPSULE | Freq: Two times a day (BID) | ORAL | Status: DC
  Filled 2014-10-18: qty 1

## 2014-10-18 MED ORDER — ATORVASTATIN CALCIUM 20 MG PO TABS
40.0000 mg | ORAL_TABLET | Freq: Every day | ORAL | Status: DC
  Administered 2014-10-18 – 2014-10-23 (×6): 40 mg via ORAL
  Filled 2014-10-18 (×6): qty 2

## 2014-10-18 MED ORDER — INSULIN ASPART 100 UNIT/ML ~~LOC~~ SOLN
0.0000 [IU] | Freq: Three times a day (TID) | SUBCUTANEOUS | Status: DC
  Administered 2014-10-18: 18:00:00 3 [IU] via SUBCUTANEOUS
  Administered 2014-10-19: 12:00:00 2 [IU] via SUBCUTANEOUS
  Administered 2014-10-19: 17:00:00 3 [IU] via SUBCUTANEOUS
  Administered 2014-10-20: 17:00:00 2 [IU] via SUBCUTANEOUS
  Administered 2014-10-20: 3 [IU] via SUBCUTANEOUS
  Administered 2014-10-21: 18:00:00 2 [IU] via SUBCUTANEOUS
  Administered 2014-10-21: 3 [IU] via SUBCUTANEOUS
  Administered 2014-10-22: 2 [IU] via SUBCUTANEOUS
  Administered 2014-10-22: 5 [IU] via SUBCUTANEOUS
  Administered 2014-10-22: 3 [IU] via SUBCUTANEOUS
  Administered 2014-10-23 – 2014-10-24 (×4): 2 [IU] via SUBCUTANEOUS
  Administered 2014-10-24: 3 [IU] via SUBCUTANEOUS
  Filled 2014-10-18: qty 3
  Filled 2014-10-18: qty 2
  Filled 2014-10-18: qty 3
  Filled 2014-10-18: qty 2
  Filled 2014-10-18: qty 3
  Filled 2014-10-18 (×5): qty 2
  Filled 2014-10-18: qty 5
  Filled 2014-10-18: qty 2
  Filled 2014-10-18 (×3): qty 3

## 2014-10-18 MED ORDER — WARFARIN SODIUM 2 MG PO TABS
2.0000 mg | ORAL_TABLET | Freq: Every day | ORAL | Status: DC
  Administered 2014-10-18: 2 mg via ORAL
  Filled 2014-10-18: qty 1

## 2014-10-18 MED ORDER — METOPROLOL TARTRATE 25 MG PO TABS
12.5000 mg | ORAL_TABLET | Freq: Two times a day (BID) | ORAL | Status: DC
  Administered 2014-10-18: 25 mg via ORAL
  Administered 2014-10-18: 12.5 mg via ORAL
  Administered 2014-10-19: 25 mg via ORAL
  Administered 2014-10-19 – 2014-10-20 (×2): 12.5 mg via ORAL
  Filled 2014-10-18 (×5): qty 1

## 2014-10-18 MED ORDER — ONDANSETRON 4 MG PO TBDP: 4.0000 mg | ORAL_TABLET | Freq: Three times a day (TID) | ORAL | Status: DC | PRN

## 2014-10-18 MED ORDER — PANTOPRAZOLE SODIUM 40 MG PO TBEC
40.0000 mg | DELAYED_RELEASE_TABLET | Freq: Every day | ORAL | Status: DC
  Administered 2014-10-19 – 2014-10-24 (×6): 40 mg via ORAL
  Filled 2014-10-18 (×6): qty 1

## 2014-10-18 MED ORDER — DOFETILIDE 125 MCG PO CAPS
125.0000 ug | ORAL_CAPSULE | Freq: Two times a day (BID) | ORAL | Status: DC
  Filled 2014-10-18 (×2): qty 1

## 2014-10-18 MED ORDER — DOXAZOSIN MESYLATE 4 MG PO TABS
2.0000 mg | ORAL_TABLET | Freq: Every day | ORAL | Status: DC
  Administered 2014-10-18 – 2014-10-23 (×6): 2 mg via ORAL
  Filled 2014-10-18 (×6): qty 1

## 2014-10-18 MED ORDER — SODIUM CHLORIDE 0.9 % IV SOLN: 8.0000 mg | Freq: Three times a day (TID) | INTRAVENOUS | Status: DC | PRN

## 2014-10-18 MED ORDER — OXYCODONE HCL 5 MG PO TABS
5.0000 mg | ORAL_TABLET | ORAL | Status: DC | PRN
  Administered 2014-10-19 – 2014-10-20 (×2): 5 mg via ORAL
  Filled 2014-10-18 (×2): qty 1

## 2014-10-18 NOTE — Progress Notes (Signed)
Patient here today for acute add on visit due to weakness, decreased appetite. Per family patient has been having intermittent nosebleeds.

## 2014-10-18 NOTE — Progress Notes (Addendum)
Dr. Cindee Lame notified that pts QTc was >500. New orders received, hold Tikosyn, cardiology consult (have him call Dr. Alcus Dad now), and off unit telemetry.

## 2014-10-18 NOTE — Plan of Care (Signed)
Problem: Discharge Progression Outcomes Goal: Discharge plan in place and appropriate individualization Address patient as Peter Walters.  Lives at home with wife. History of multiple myeloma,DM,HTN, controlled with home medications. Goal: Other Discharge Outcomes/Goals Plan of Care Progressing to Goal: Acute Renal Failure- Patient receiving IV fluids. Tolerating diet. No complaints of pain.

## 2014-10-18 NOTE — H&P (Signed)
[facility]  Inpatient day:  10/18/2014  Chief Complaint: Peter Walters is an 79 y.o. male with stage III multiple myeloma who is admitted from the oncology clinic with progressive weakness and worsening renal insufficiency.  HPI: The patient is diagnosed with a multiple myeloma in 2013. He states that he presented with progressive weakness and fatigue. He was anemic and required transfusion. Bone marrow at the [facility] confirmed multiple myeloma. FISH studies revealed a 13q deletion.  He was initially treated with Velcade, Cytoxan, and Decadron. According to his wife, he was on this treatment for approximately a year. With progressive disease, he was switched to carfilzomib (Kyporlis) through 05/14/2014. He again progressed. He was initially treated with Velcade, Decadron, and Revlimid.  Revlimid was discontinued as it made him sick. He has been unable to take steroids secondary to his diabetes. He last received Velcade on 09/30/2014.  He was seen in the medical oncology clinic by his oncologist, Dr. Delight Hoh, on 09/30/2014. At that time, he was noted to have increased nausea and poor appetite. He was weak and fatigued. He denied any neurologic complaints. Labs included a hematocrit 26.7, hemoglobin 8.9, platelets 55,000, WBC 4400 with an ANC of 2200. Creatinine was 2.04. Serum protein electrophoresis revealed a total protein of 10 g/dL with an M spike of 4.1g/dL of IgG monoclonal protein with kappa light chain specificity. He was felt to have progressive disease with increasing M spike and evidence of end organ damage (anemia, thrombocytopenia and renal insufficiency).  He was seen in follow-up on 10/07/2014.  Per the patient's wife, discussions were held regarding a switch to a different to therapy (to be determined on 10/21/2014).  Today, he presented with increasing weakness. He's had some oozing from his nose.  His bones are achy.  He has felt  short of breath.  He states that he spends the majority of his day resting. He still able to perform his activities of daily living. Labs revealed an increase in creatinine.  He is felt to be dehydrated.  Past Medical History  Diagnosis Date  . A-fib   . Hypertension   . Benign prostatic hypertrophy   . History of gastrointestinal bleeding   . Hyperlipidemia   . History of multiple myeloma   . CAD (coronary artery disease)   . GERD (gastroesophageal reflux disease)   . CHF (congestive heart failure)   . Peptic ulcer disease   . Hyponatremia   . Multiple myeloma in relapse 10/17/2014    Past Surgical History  Procedure Laterality Date  . Cardiac catheterization    . Coronary angioplasty      [facility]    Family History  Problem Relation Age of Onset  . Heart disease Father     Social History:  reports that he has never smoked. He does not have any smokeless tobacco history on file. He reports that he does not drink alcohol or use illicit drugs.  Allergies:  Allergies  Allergen Reactions  . Aspirin Hives and Other (See Comments)    Reaction:  Causes pt to bleed.   . Celecoxib Other (See Comments)    Reaction:  Causes pt to bleed.     Medications Prior to Admission  Medication Sig Dispense Refill  . atorvastatin (LIPITOR) 40 MG tablet Take 40 mg by mouth at bedtime.    Marland Kitchen doxazosin (CARDURA) 2 MG tablet Take 2 mg by mouth at bedtime.    Marland Kitchen glipiZIDE (GLUCOTROL XL) 10  MG 24 hr tablet Take 10 mg by mouth 2 (two) times daily.    . magnesium oxide (MAG-OX) 400 MG tablet Take 400 mg by mouth 2 (two) times daily.    . metoprolol tartrate (LOPRESSOR) 25 MG tablet Take 12.5 mg by mouth 2 (two) times daily.     Marland Kitchen acetaminophen (TYLENOL) 325 MG tablet Take 650 mg by mouth every 4 (four) hours as needed.    . cetirizine (ZYRTEC) 10 MG tablet Take 10 mg by mouth daily.    Marland Kitchen dicyclomine (BENTYL) 20 MG tablet Take 1 tablet (20 mg total) by mouth every 6 (six) hours. As needed for  abdominal pain (Patient not taking: Reported on 10/18/2014) 20 tablet 0  . dofetilide (TIKOSYN) 500 MCG capsule Take 500 mcg by mouth every 12 (twelve) hours.     . enalapril (VASOTEC) 20 MG tablet Take 20 mg by mouth 2 (two) times daily.     . insulin detemir (LEVEMIR) 100 UNIT/ML injection Inject 15 Units into the skin at bedtime.    . lidocaine-prilocaine (EMLA) cream Apply 1 application topically once.    . loperamide (IMODIUM A-D) 2 MG tablet Take 1 tablet (2 mg total) by mouth 4 (four) times daily as needed for diarrhea or loose stools. 30 tablet 0  . megestrol (MEGACE) 40 MG/ML suspension Take 40 mg by mouth daily.    . ondansetron (ZOFRAN-ODT) 8 MG disintegrating tablet Take 1 tablet (8 mg total) by mouth every 8 (eight) hours as needed for nausea. (Patient not taking: Reported on 10/18/2014) 30 tablet 0  . pantoprazole (PROTONIX) 40 MG tablet Take 40 mg by mouth daily.    . ranitidine (ZANTAC) 150 MG tablet Take 150 mg by mouth 2 (two) times daily.    Marland Kitchen warfarin (COUMADIN) 1 MG tablet Take 2 mg by mouth at bedtime.    Marland Kitchen warfarin (COUMADIN) 5 MG tablet Take 5 mg by mouth at bedtime.      Review of Systems: GENERAL:  Feels fatigued.  Best rest.  No fevers or sweats.  Weight loss. PERFORMANCE STATUS (ECOG): 2 HEENT:  No visual changes, runny nose, sore throat, mouth sores or tenderness. Lungs: Shortness of breath.  No cough.  No hemoptysis. Cardiac:  "Heart feels wide open".  No chest pain, palpitations, orthopnea, or PND. GI:  No nausea, vomiting, diarrhea, constipation, melena or hematochezia. GU:  No urgency, frequency, dysuria, or hematuria. Musculoskeletal:  Nones achy.  No joint pain.  No muscle tenderness. Extremities:  No pain or swelling. Skin:  No rashes or skin changes. Neuro:  No headache, numbness or weakness, balance or coordination issues. Endocrine:  Diabetes.  No thyroid issues, hot flashes or night sweats. Psych:  No mood changes, depression or anxiety. Pain:  No focal  pain. Review of systems:  All other systems reviewed and found to be negative.  Physical:  Blood pressure 133/67, pulse 79, temperature 98.3 F (36.8 C), temperature source Oral, resp. rate 20, SpO2 100 %.  GENERAL:  Thin elderly gentleman sitting comfortably in the consult room in no acute distress. MENTAL STATUS:  Alert and oriented to person, place and time. HEAD:  Wearing a blue cap.  Short graying hair.  Mustache. Temporal wasting.  Normocephalic, atraumatic, face symmetric, no Cushingoid features. EYES:  Brown eyes.  Pupils equal round and reactive to light and accomodation.  No conjunctivitis or scleral icterus. ENT:  Oropharynx clear without lesion.  Tongue normal. Mucous membranes moist.  RESPIRATORY:  Clear to auscultation without rales,  wheezes or rhonchi. CARDIOVASCULAR:  Regular rate and rhythm without murmur, rub or gallop. ABDOMEN:  Soft, non-tender, with active bowel sounds, and no hepatosplenomegaly.  No masses. SKIN:  No rashes, ulcers or lesions. EXTREMITIES: No edema, no skin discoloration or tenderness.  No palpable cords. LYMPH NODES: No palpable cervical, supraclavicular, axillary or inguinal adenopathy  NEUROLOGICAL: Unremarkable. PSYCH:  Appropriate.  Results for orders placed or performed during the hospital encounter of 10/18/14 (from the past 48 hour(s))  Glucose, capillary     Status: Abnormal   Collection Time: 10/18/14  5:01 PM  Result Value Ref Range   Glucose-Capillary 219 (H) 70 - 99 mg/dL   No results found.  Assessment:  The patient is a 79 year old gentleman with progressive stage III multiple myeloma on third line therapy.  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  He has progressive weakness likely due to dehydration and pancytopenia secondary to progressive marrow involvement.  His renal function has declined secondary to dehydration and an increasing M spike.  Plan: 1)  Hematology/Oncology-  Discussed overall prognosis with patient.   Limited treatment options.  Await recommendations from patient's primary oncologist.  Supportive care over the weekend.  Follow counts.  May need PRBCs +/- platelet transfusion if any significant bleeding or platelets < 20,000.  Check PT/INR given poor nutrition.  Consult palliative care medicine. 2)  Fluids/Electrolytes/Nutrition-  Begin gentle hydration.  Consult nephrology.  Suspect component of renal insufficiency due to dehydration and worsening myeloma.  Adjust Tikosyn per pharamacy recommendations.  Check EKG (assess QT interval).  Blood sugar checks with SSI.  Nutrition consult.  Patient on Megace (adjust dose). 3)  Code status- full per patient.   Lequita Asal, MD  10/18/2014, 6:18 PM

## 2014-10-18 NOTE — Progress Notes (Addendum)
Dr. Toy Baker notified for Dr. Zachery Dakins. (Paged [phone] and left Dr. Alcus Dad cell number for Dr. Toy Baker to call her back.)

## 2014-10-18 NOTE — Progress Notes (Signed)
Pt admitted to hospital. Report called to 1C. Spoke with Isaiah Serge who took verbal report. Pt transported to room 101 via wheelchair.

## 2014-10-19 ENCOUNTER — Inpatient Hospital Stay: Payer: Medicare HMO

## 2014-10-19 ENCOUNTER — Encounter: Payer: Self-pay | Admitting: Nephrology

## 2014-10-19 LAB — COMPREHENSIVE METABOLIC PANEL
ALBUMIN: 2.6 g/dL — AB (ref 3.5–5.0)
ALT: 12 U/L — ABNORMAL LOW (ref 17–63)
AST: 17 U/L (ref 15–41)
Alkaline Phosphatase: 41 U/L (ref 38–126)
Anion gap: 3 — ABNORMAL LOW (ref 5–15)
BILIRUBIN TOTAL: 0.8 mg/dL (ref 0.3–1.2)
BUN: 34 mg/dL — ABNORMAL HIGH (ref 6–20)
CO2: 18 mmol/L — ABNORMAL LOW (ref 22–32)
Calcium: 7.6 mg/dL — ABNORMAL LOW (ref 8.9–10.3)
Chloride: 113 mmol/L — ABNORMAL HIGH (ref 101–111)
Creatinine, Ser: 2.23 mg/dL — ABNORMAL HIGH (ref 0.61–1.24)
GFR calc Af Amer: 31 mL/min — ABNORMAL LOW (ref >60)
GFR calc non Af Amer: 27 mL/min — ABNORMAL LOW (ref >60)
Glucose, Bld: 133 mg/dL — ABNORMAL HIGH (ref 65–99)
Potassium: 4.6 mmol/L (ref 3.5–5.1)
SODIUM: 134 mmol/L — AB (ref 135–145)
Total Protein: 10.5 g/dL — ABNORMAL HIGH (ref 6.5–8.1)

## 2014-10-19 LAB — ABO/RH: ABO/RH(D): A POS

## 2014-10-19 LAB — GLUCOSE, CAPILLARY
GLUCOSE-CAPILLARY: 101 mg/dL — AB (ref 70–99)
GLUCOSE-CAPILLARY: 192 mg/dL — AB (ref 70–99)
Glucose-Capillary: 209 mg/dL — ABNORMAL HIGH (ref 70–99)

## 2014-10-19 LAB — CBC
HCT: 18.4 % — ABNORMAL LOW (ref 40.0–52.0)
Hemoglobin: 6.2 g/dL — ABNORMAL LOW (ref 13.0–18.0)
MCH: 30.9 pg (ref 26.0–34.0)
MCHC: 33.8 g/dL (ref 32.0–36.0)
MCV: 91.6 fL (ref 80.0–100.0)
PLATELETS: 26 10*3/uL — AB (ref 150–440)
RBC: 2.01 MIL/uL — ABNORMAL LOW (ref 4.40–5.90)
RDW: 19.2 % — AB (ref 11.5–14.5)
WBC: 2.4 10*3/uL — AB (ref 3.8–10.6)

## 2014-10-19 LAB — PREPARE RBC (CROSSMATCH)

## 2014-10-19 LAB — PROTIME-INR
INR: 1.56
Prothrombin Time: 18.9 seconds — ABNORMAL HIGH (ref 11.4–15.0)

## 2014-10-19 MED ORDER — SODIUM CHLORIDE 0.9 % IV SOLN
Freq: Once | INTRAVENOUS | Status: AC
  Administered 2014-10-19: 13:00:00 via INTRAVENOUS

## 2014-10-19 MED ORDER — GLUCERNA SHAKE PO LIQD
237.0000 mL | Freq: Two times a day (BID) | ORAL | Status: DC
  Administered 2014-10-19 – 2014-10-24 (×10): 237 mL via ORAL

## 2014-10-19 MED ORDER — DIPHENHYDRAMINE HCL 25 MG PO CAPS
25.0000 mg | ORAL_CAPSULE | Freq: Once | ORAL | Status: AC
  Administered 2014-10-19: 25 mg via ORAL
  Filled 2014-10-19: qty 1

## 2014-10-19 MED ORDER — ACETAMINOPHEN 325 MG PO TABS
650.0000 mg | ORAL_TABLET | Freq: Once | ORAL | Status: AC
  Administered 2014-10-19: 13:00:00 650 mg via ORAL
  Filled 2014-10-19: qty 2

## 2014-10-19 NOTE — Consult Note (Signed)
[facility]  CONSULT NOTE    Date: 10/19/2014                  Patient Name:  Peter Walters  MRN: [id number]  DOB: 1935/06/27  Age / Sex: 79 y.o., male         PCP: Petra Kuba, MD                 Service Requesting Consult: Dr. Mike Gip                 Reason for Consult: Acute Renal Failure on Chronic Kidney Disease stage III            History of Present Illness: Peter Walters is a 79 y.o.black male with multiple myeloma, atrial fibrillation, hypertension, hyperlipidmia, coronary artery disease status post stent, RIJ port, anemia, history of peptic ulcer disease, GERD, diabetes mellitus type II, BPH and allergic rhinitis.   Peter Walters was admitted to [facility] on 10/18/2014 for progressive weakness and shortness of breath. He was found to have pancytopenia and acute renal failure.Acute renal failure with hyponatremia 125 and potassium of 5.3. Patient then started on IV fluids Normal saline. Nephrology consulted.   Patient's granddaughter is at bedside who assists with history taking. Patient denies use of any nonsteroidal agents. Claims his appetite has been poor. He claims to be having low glucose levels.    Medications: Outpatient medications: Prescriptions prior to admission  Medication Sig Dispense Refill Last Dose  . atorvastatin (LIPITOR) 40 MG tablet Take 40 mg by mouth at bedtime.   10/18/2014 at 930  . doxazosin (CARDURA) 2 MG tablet Take 2 mg by mouth at bedtime.   10/18/2014 at 930  . glipiZIDE (GLUCOTROL XL) 10 MG 24 hr tablet Take 10 mg by mouth 2 (two) times daily.   10/18/2014 at 930  . magnesium oxide (MAG-OX) 400 MG tablet Take 400 mg by mouth 2 (two) times daily.   10/18/2014 at 930  . metoprolol tartrate (LOPRESSOR) 25 MG tablet Take 12.5 mg by mouth 2 (two) times daily.    10/18/2014 at 930  . acetaminophen (TYLENOL) 325 MG tablet Take 650 mg by mouth every 4 (four) hours as needed.   Taking  . cetirizine (ZYRTEC) 10 MG tablet Take 10 mg by mouth  daily.   Taking  . dicyclomine (BENTYL) 20 MG tablet Take 1 tablet (20 mg total) by mouth every 6 (six) hours. As needed for abdominal pain (Patient not taking: Reported on 10/18/2014) 20 tablet 0 Not Taking  . dofetilide (TIKOSYN) 500 MCG capsule Take 500 mcg by mouth every 12 (twelve) hours.    Taking  . enalapril (VASOTEC) 20 MG tablet Take 20 mg by mouth 2 (two) times daily.    Taking  . insulin detemir (LEVEMIR) 100 UNIT/ML injection Inject 15 Units into the skin at bedtime.   Not Taking  . lidocaine-prilocaine (EMLA) cream Apply 1 application topically once.   Taking  . loperamide (IMODIUM A-D) 2 MG tablet Take 1 tablet (2 mg total) by mouth 4 (four) times daily as needed for diarrhea or loose stools. 30 tablet 0 Taking  . megestrol (MEGACE) 40 MG/ML suspension Take 40 mg by mouth daily.   Taking  . ondansetron (ZOFRAN-ODT) 8 MG disintegrating tablet Take 1 tablet (8 mg total) by mouth every 8 (eight) hours as needed for nausea. (Patient not taking: Reported on 10/18/2014) 30 tablet 0 Not Taking  . pantoprazole (PROTONIX) 40 MG tablet Take  40 mg by mouth daily.   Taking  . ranitidine (ZANTAC) 150 MG tablet Take 150 mg by mouth 2 (two) times daily.   Taking  . warfarin (COUMADIN) 1 MG tablet Take 2 mg by mouth at bedtime.   Taking  . warfarin (COUMADIN) 5 MG tablet Take 5 mg by mouth at bedtime.   Taking    Current medications: Current Facility-Administered Medications  Medication Dose Route Frequency Provider Last Rate Last Dose  . 0.9 %  sodium chloride infusion   Intravenous Continuous Evlyn Kanner, NP 150 mL/hr at 10/19/14 0820    . acetaminophen (TYLENOL) tablet 650 mg  650 mg Oral Q4H PRN Evlyn Kanner, NP      . alum & mag hydroxide-simeth (MAALOX/MYLANTA) 200-200-20 MG/5ML suspension 60 mL  60 mL Oral Q4H PRN Evlyn Kanner, NP      . atorvastatin (LIPITOR) tablet 40 mg  40 mg Oral QHS Evlyn Kanner, NP   40 mg at 10/18/14 2355  . doxazosin (CARDURA) tablet 2 mg  2 mg Oral  QHS Evlyn Kanner, NP   2 mg at 10/18/14 2355  . feeding supplement (GLUCERNA SHAKE) (GLUCERNA SHAKE) liquid 237 mL  237 mL Oral BID WC Lequita Asal, MD      . glipiZIDE (GLUCOTROL XL) 24 hr tablet 10 mg  10 mg Oral BID AC Evlyn Kanner, NP   10 mg at 10/19/14 0750  . insulin aspart (novoLOG) injection 0-9 Units  0-9 Units Subcutaneous TID WC Evlyn Kanner, NP   2 Units at 10/19/14 1207  . lidocaine-prilocaine (EMLA) cream 1 application  1 application Topical Once Evlyn Kanner, NP   1 application at 05/69/79 1727  . loratadine (CLARITIN) tablet 10 mg  10 mg Oral Daily Evlyn Kanner, NP   10 mg at 10/19/14 0750  . megestrol (MEGACE) 40 MG/ML suspension 40 mg  40 mg Oral Daily Evlyn Kanner, NP   40 mg at 10/19/14 0909  . metoprolol tartrate (LOPRESSOR) tablet 12.5 mg  12.5 mg Oral BID Evlyn Kanner, NP   12.5 mg at 10/19/14 0751  . ondansetron (ZOFRAN) injection 4 mg  4 mg Intravenous Q8H PRN Evlyn Kanner, NP      . oxyCODONE (Oxy IR/ROXICODONE) immediate release tablet 5 mg  5 mg Oral Q4H PRN Evlyn Kanner, NP   5 mg at 10/19/14 0752  . pantoprazole (PROTONIX) EC tablet 40 mg  40 mg Oral QAC breakfast Evlyn Kanner, NP   40 mg at 10/19/14 0751  . senna-docusate (Senokot-S) tablet 1 tablet  1 tablet Oral QHS PRN Evlyn Kanner, NP          Allergies: Allergies  Allergen Reactions  . Aspirin Hives and Other (See Comments)    Reaction:  Causes pt to bleed.   . Celecoxib Other (See Comments)    Reaction:  Causes pt to bleed.       Past Medical History: Past Medical History  Diagnosis Date  . A-fib   . Hypertension   . Benign prostatic hypertrophy   . History of gastrointestinal bleeding   . Hyperlipidemia   . History of multiple myeloma   . CAD (coronary artery disease)   . GERD (gastroesophageal reflux disease)   . CHF (congestive heart failure)   . Peptic ulcer disease   . Hyponatremia   . Multiple myeloma in relapse 10/17/2014  . Cancer      multiple myeloma  Past Surgical History: Past Surgical History  Procedure Laterality Date  . Cardiac catheterization    . Coronary angioplasty      [facility]     Family History: Family History  Problem Relation Age of Onset  . Heart disease Father   . Kidney failure Brother      Social History: History   Social History  . Marital Status: Married    Spouse Name: N/A  . Number of Children: N/A  . Years of Education: N/A   Occupational History  . Not on file.   Social History Main Topics  . Smoking status: Never Smoker   . Smokeless tobacco: Not on file  . Alcohol Use: No  . Drug Use: No  . Sexual Activity: Not on file   Other Topics Concern  . Not on file   Social History Narrative     Review of Systems: Review of Systems  Constitutional: Positive for weight loss and malaise/fatigue. Negative for fever, chills and diaphoresis.  HENT: Negative for congestion, ear discharge, ear pain, hearing loss, nosebleeds, sore throat and tinnitus.   Eyes: Negative for blurred vision, double vision, photophobia, pain, discharge and redness.  Respiratory: Negative.  Negative for cough, hemoptysis, sputum production, shortness of breath, wheezing and stridor.   Cardiovascular: Positive for orthopnea. Negative for chest pain, palpitations, claudication, leg swelling and PND.  Gastrointestinal: Negative for heartburn, nausea, vomiting, abdominal pain, diarrhea, constipation, blood in stool and melena.  Genitourinary: Negative.  Negative for dysuria, urgency, frequency, hematuria and flank pain.  Musculoskeletal: Negative for myalgias, back pain, joint pain, falls and neck pain.  Skin: Negative for itching and rash.  Neurological: Positive for weakness. Negative for dizziness, tingling, tremors, sensory change, speech change, focal weakness, seizures, loss of consciousness and headaches.  Psychiatric/Behavioral: Negative.  Negative for depression, suicidal ideas,  hallucinations, memory loss and substance abuse. The patient is not nervous/anxious and does not have insomnia.     Vital Signs: Blood pressure 143/67, pulse 87, temperature 99.7 F (37.6 C), temperature source Oral, resp. rate 20, height 5' 8"  (1.727 m), weight 72.122 kg (159 lb), SpO2 100 %.  Weight trends: Filed Weights   10/18/14 1559  Weight: 72.122 kg (159 lb)    Physical Exam: General: NAD,   Head: Normocephalic, atraumatic. Dry mucosal membranes, pale conjunctiva  Eyes: Anicteric, PERRL  Neck: Supple, trachea midline, RIJ port  Lungs:  Clear to auscultation  Heart: Regular rate and rhythm  Abdomen:  Soft, nontender,   Extremities: no peripheral edema.  Neurologic: Nonfocal, moving all four extremities  Skin: No lesions        Lab results: Basic Metabolic Panel:  Recent Labs Lab 10/13/14 1900 10/18/14 1107 10/19/14 0436  NA 128* 130* 134*  K 5.6* 4.7 4.6  CL 104 108 113*  CO2 20* 18* 18*  GLUCOSE 227* 161* 133*  BUN 49* 44* 34*  CREATININE 2.33* 2.77* 2.23*  CALCIUM 8.3* 8.4* 7.6*  MG  --  2.1  --     Liver Function Tests:  Recent Labs Lab 10/13/14 1900 10/18/14 1107 10/19/14 0436  AST 18 19 17   ALT 16* 14* 12*  ALKPHOS 48 53 41  BILITOT 0.7 0.7 0.8  PROT 10.8* >12.0* 10.5*  ALBUMIN 2.7* 3.1* 2.6*    Recent Labs Lab 10/13/14 1900  LIPASE 40   No results for input(s): AMMONIA in the last 168 hours.  CBC:  Recent Labs Lab 10/13/14 1232 10/15/14 1451 10/18/14 1107 10/19/14 0436  WBC  4.6 3.4* 3.7* 2.4*  NEUTROABS 1.9  --  1.5  --   HGB 8.8* 6.7* 7.4* 6.2*  HCT 26.0* 19.5* 21.7* 18.4*  MCV 91.5 91.3 91.5 91.6  PLT 22* 23* 32* 26*    Cardiac Enzymes: No results for input(s): CKTOTAL, CKMB, CKMBINDEX, TROPONINI in the last 168 hours.  BNP: Invalid input(s): POCBNP  CBG:  Recent Labs Lab 10/18/14 1701 10/18/14 2213 10/19/14 0732 10/19/14 1107  GLUCAP 219* 218* 101* 192*    Microbiology: Results for orders placed or  performed during the hospital encounter of 09/18/14  Stool culture     Status: None   Collection Time: 09/18/14 11:07 PM  Result Value Ref Range Status   Micro Text Report   Final       COMMENT                   NO SALMONELLA OR SHIGELLA ISOLATED   COMMENT                   NO PATHOGENIC E.COLI DETECTED   COMMENT                   NO CAMPYLOBACTER ANTIGEN DETECTED   ANTIBIOTIC                                                      Clostridium Difficile [facility])     Status: None   Collection Time: 09/19/14  4:06 AM  Result Value Ref Range Status   Micro Text Report   Final       C.DIFFICILE ANTIGEN       C.DIFFICILE GDH ANTIGEN : NEGATIVE   C.DIFFICILE TOXIN A/B     C.DIFFICILE TOXINS A AND B : NEGATIVE   INTERPRETATION            Negative for C. difficile.    ANTIBIOTIC                                                        Coagulation Studies:  Recent Labs  10/18/14 1849 10/19/14 0436  LABPROT 19.1* 18.9*  INR 1.59 1.56    Urinalysis: No results for input(s): COLORURINE, LABSPEC, PHURINE, GLUCOSEU, HGBUR, BILIRUBINUR, KETONESUR, PROTEINUR, UROBILINOGEN, NITRITE, LEUKOCYTESUR in the last 72 hours.  Invalid input(s): APPERANCEUR    Imaging:  No results found.   Assessment & Plan: Mr. Summons is a 79 y.o.black male with multiple myeloma, atrial fibrillation, hypertension, hyperlipidmia, coronary artery disease status post stent, RIJ port, anemia, history of peptic ulcer disease, GERD, diabetes mellitus type II, BPH and allergic rhinitis.   Patient was admitted to [facility] on 10/18/2014 with progressive weakness.   1. Acute renal failure on chronic kidney disease stage III with hyponatremia, hyperkalemia and proteinuria: N17.9, N18.3, E87.1, E87.5, R80.9 Chronic Kidney Disease is secondary to multiple myeloma.  - History is consistent with prerenal azotemia. Agree with IV fluids. Will decrease to NS at 59m/hr.  - Renal ultrasound, PTH, and phosphorus.  - holding enalapril  due to acute renal failure and hyperkalemia  2. Acute pancytopenia: leukocytopenia, anemia and thrombocytopenia - ordered PRBC transfusion - Appreciate heme/onc input.  3. Hypertension: well controlled on admission. Holding enalapril - metorpolol, doxazosin  4. Diabetes Mellitus type II with renal manifestations: with proteinuria - on glipizide.

## 2014-10-19 NOTE — Progress Notes (Signed)
[facility] Hematology/Oncology Consult  Date of admission: 10/18/2014  Hospital day:  10/19/2014  Chief Complaint: Peter Walters is a 79 y.o. male who was admitted with progressive weakness, renal insufficiency, and pancytopenia.  Subjective:  The patient has had some improvement over the past 24 hours. He is eating somewhat better. He is working with physical therapy and walking.  He denies any bruising or bleeding.  He denies any chest pain or palpitations.  Social History: The patient is accompanied by his wife.  Allergies:  Allergies  Allergen Reactions  . Aspirin Hives and Other (See Comments)    Reaction:  Causes pt to bleed.   . Celecoxib Other (See Comments)    Reaction:  Causes pt to bleed.     Medications Prior to Admission  Medication Sig Dispense Refill  . atorvastatin (LIPITOR) 40 MG tablet Take 40 mg by mouth at bedtime.    Marland Kitchen doxazosin (CARDURA) 2 MG tablet Take 2 mg by mouth at bedtime.    Marland Kitchen glipiZIDE (GLUCOTROL XL) 10 MG 24 hr tablet Take 10 mg by mouth 2 (two) times daily.    . magnesium oxide (MAG-OX) 400 MG tablet Take 400 mg by mouth 2 (two) times daily.    . metoprolol tartrate (LOPRESSOR) 25 MG tablet Take 12.5 mg by mouth 2 (two) times daily.     Marland Kitchen acetaminophen (TYLENOL) 325 MG tablet Take 650 mg by mouth every 4 (four) hours as needed.    . cetirizine (ZYRTEC) 10 MG tablet Take 10 mg by mouth daily.    Marland Kitchen dicyclomine (BENTYL) 20 MG tablet Take 1 tablet (20 mg total) by mouth every 6 (six) hours. As needed for abdominal pain (Patient not taking: Reported on 10/18/2014) 20 tablet 0  . dofetilide (TIKOSYN) 500 MCG capsule Take 500 mcg by mouth every 12 (twelve) hours.     . enalapril (VASOTEC) 20 MG tablet Take 20 mg by mouth 2 (two) times daily.     . insulin detemir (LEVEMIR) 100 UNIT/ML injection Inject 15 Units into the skin at bedtime.    . lidocaine-prilocaine (EMLA) cream Apply 1 application topically once.    . loperamide (IMODIUM  A-D) 2 MG tablet Take 1 tablet (2 mg total) by mouth 4 (four) times daily as needed for diarrhea or loose stools. 30 tablet 0  . megestrol (MEGACE) 40 MG/ML suspension Take 40 mg by mouth daily.    . ondansetron (ZOFRAN-ODT) 8 MG disintegrating tablet Take 1 tablet (8 mg total) by mouth every 8 (eight) hours as needed for nausea. (Patient not taking: Reported on 10/18/2014) 30 tablet 0  . pantoprazole (PROTONIX) 40 MG tablet Take 40 mg by mouth daily.    . ranitidine (ZANTAC) 150 MG tablet Take 150 mg by mouth 2 (two) times daily.    Marland Kitchen warfarin (COUMADIN) 1 MG tablet Take 2 mg by mouth at bedtime.    Marland Kitchen warfarin (COUMADIN) 5 MG tablet Take 5 mg by mouth at bedtime.      Review of Systems: GENERAL: Feels s little better. No fevers or sweats. PERFORMANCE STATUS (ECOG): 2 HEENT: No visual changes, runny nose, sore throat, mouth sores or tenderness. Lungs: Shortness of breath. No cough. No hemoptysis. Cardiac:No chest pain, palpitations, orthopnea, or PND. GI: No nausea, vomiting, diarrhea, constipation, melena or hematochezia. GU: No urgency, frequency, dysuria, or hematuria. Musculoskeletal: Achy. No joint pain. No muscle tenderness. Extremities: No pain or swelling. Skin: No rashes or skin changes. Neuro: No headache, numbness or  weakness, balance or coordination issues. Endocrine: Diabetes. No thyroid issues, hot flashes or night sweats. Psych: No mood changes, depression or anxiety. Pain: No focal pain. Review of systems: All other systems reviewed and found to be negative.  Physical Exam: Blood pressure 143/67, pulse 87, temperature 99.7 F (37.6 C), temperature source Oral, resp. rate 20, height $RemoveBe'5\' 8"'[id number]$  (1.727 m), weight 159 lb (72.122 kg), SpO2 100 %.  GENERAL: Thin elderly gentleman sitting comfortably in bed on the medical unit.  He is eating and in no acute distress. MENTAL STATUS: Alert and oriented to person, place and time. HEAD: Short graying hair.  Mustache. Temporal wasting. Normocephalic, atraumatic, face symmetric, no Cushingoid features. EYES: Brown eyes. Pupils equal round and reactive to light and accomodation. No conjunctivitis or scleral icterus. ENT: Oropharynx clear without lesion. Tongue normal. Mucous membranes moist.  RESPIRATORY: Clear to auscultation without rales, wheezes or rhonchi. CARDIOVASCULAR: Regular rate and rhythm without murmur, rub or gallop. ABDOMEN: Soft, non-tender, with active bowel sounds, and no hepatosplenomegaly. No masses. SKIN: No rashes, ulcers or lesions. EXTREMITIES: No edema, no skin discoloration or tenderness. No palpable cords. LYMPH NODES: No palpable cervical, supraclavicular, axillary or inguinal adenopathy  NEUROLOGICAL: Unremarkable. PSYCH: Appropriate  Results for orders placed or performed during the hospital encounter of 10/18/14 (from the past 48 hour(s))  Glucose, capillary     Status: Abnormal   Collection Time: 10/18/14  5:01 PM  Result Value Ref Range   Glucose-Capillary 219 (H) 70 - 99 mg/dL  Protime-INR     Status: Abnormal   Collection Time: 10/18/14  6:49 PM  Result Value Ref Range   Prothrombin Time 19.1 (H) 11.4 - 15.0 seconds   INR 1.59   Glucose, capillary     Status: Abnormal   Collection Time: 10/18/14 10:13 PM  Result Value Ref Range   Glucose-Capillary 218 (H) 70 - 99 mg/dL  Comprehensive metabolic panel     Status: Abnormal   Collection Time: 10/19/14  4:36 AM  Result Value Ref Range   Sodium 134 (L) 135 - 145 mmol/L   Potassium 4.6 3.5 - 5.1 mmol/L   Chloride 113 (H) 101 - 111 mmol/L   CO2 18 (L) 22 - 32 mmol/L   Glucose, Bld 133 (H) 65 - 99 mg/dL   BUN 34 (H) 6 - 20 mg/dL   Creatinine, Ser 2.23 (H) 0.61 - 1.24 mg/dL   Calcium 7.6 (L) 8.9 - 10.3 mg/dL   Total Protein 10.5 (H) 6.5 - 8.1 g/dL   Albumin 2.6 (L) 3.5 - 5.0 g/dL   AST 17 15 - 41 U/L   ALT 12 (L) 17 - 63 U/L   Alkaline Phosphatase 41 38 - 126 U/L   Total Bilirubin 0.8 0.3 -  1.2 mg/dL   GFR calc non Af Amer 27 (L) >60 mL/min   GFR calc Af Amer 31 (L) >60 mL/min    Comment: (NOTE) The eGFR has been calculated using the CKD EPI equation. This calculation has not been validated in all clinical situations. eGFR's persistently <60 mL/min signify possible Chronic Kidney Disease.    Anion gap 3 (L) 5 - 15  CBC     Status: Abnormal   Collection Time: 10/19/14  4:36 AM  Result Value Ref Range   WBC 2.4 (L) 3.8 - 10.6 K/uL   RBC 2.01 (L) 4.40 - 5.90 MIL/uL   Hemoglobin 6.2 (L) 13.0 - 18.0 g/dL   HCT 18.4 (L) 40.0 - 52.0 %   MCV 91.6  80.0 - 100.0 fL   MCH 30.9 26.0 - 34.0 pg   MCHC 33.8 32.0 - 36.0 g/dL   RDW 19.2 (H) 11.5 - 14.5 %   Platelets 26 (LL) 150 - 440 K/uL    Comment: CRITICAL CALLED TO HANNAH CAHOON AT [phone] ON 10/19/14 BY [email or web address] KitchenMarland KitchenSpanish [facility] PLATELET COUNT CONFIRMED BY SMEAR   Protime-INR     Status: Abnormal   Collection Time: 10/19/14  4:36 AM  Result Value Ref Range   Prothrombin Time 18.9 (H) 11.4 - 15.0 seconds   INR 1.56   Type and screen     Status: None (Preliminary result)   Collection Time: 10/19/14  4:38 AM  Result Value Ref Range   ABO/RH(D) A POS    Antibody Screen NEG    Sample Expiration 10/22/2014    Unit Number [id number]    Blood Component Type RED CELLS,LR    Unit division 00    Status of Unit ALLOCATED    Transfusion Status OK TO TRANSFUSE    Crossmatch Result COMPATIBLE    Unit Number [id number]    Blood Component Type RBC, LR IRR    Unit division 00    Status of Unit ISSUED    Transfusion Status OK TO TRANSFUSE    Crossmatch Result COMPATIBLE   ABO/Rh     Status: None   Collection Time: 10/19/14  4:40 AM  Result Value Ref Range   ABO/RH(D) A POS   Glucose, capillary     Status: Abnormal   Collection Time: 10/19/14  7:32 AM  Result Value Ref Range   Glucose-Capillary 101 (H) 70 - 99 mg/dL  Prepare RBC     Status: None   Collection Time: 10/19/14 10:29 AM  Result Value Ref Range   Order Confirmation ORDER  PROCESSED BY BLOOD BANK   Glucose, capillary     Status: Abnormal   Collection Time: 10/19/14 11:07 AM  Result Value Ref Range   Glucose-Capillary 192 (H) 70 - 99 mg/dL   No results found.  Assessment:  Peter Walters is a 79 y.o. male with progressive stage III multiple myeloma on third line therapy. 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 He has progressive weakness likely due to dehydration and pancytopenia secondary to progressive marrow involvement. His renal function has declined secondary to dehydration and an increasing M spike.  Plan: 1. Hematology/Oncology-  Counts have drifted down with hydration.  Discussed PRBC transfusion with patient and his wife.  He is agreeable.  Platelets adequate (> 20,000).  No bleeding.  INR is slowly coming up on Coumadin (1.2 last week per patient).  Currently taking 7 mg at night.  Discuss concern about therapeutic anticoagulation with low platelets secondary to myeloma.  Poor diet may also be problematic on Coumadin.  Discussed with cardiology.  As patient in NSR, will discontinue Coumadin. 2. Fluids/Electrolytes/Nutrition-  Creatinine slightly better.  Enalapril discontinued to help with renal perfusion.  Appreciate nutrition consult.  Patient wishes to try Glucerna shakes again. 3. Cardiology-  QT elevated.  Tikosyn discontinued.  Patient on central telemetry.  Appreciate cardiology consult.  Echocardiogram pending. 4. Disposition-  Noticeable improvement.  Appreciate hospitalist assistance.  Lequita Asal, MD  10/19/2014, 1:55 PM

## 2014-10-19 NOTE — Progress Notes (Signed)
Dr. Alcus Dad notified of patients critical platelet of 26 and current INR. If platelets drop below 20 call Dr. Alcus Dad, otherwise no new orders.

## 2014-10-19 NOTE — Consult Note (Signed)
[facility] Cardiology Consultation Note  Patient ID: Peter Walters, MRN: [id number], DOB/AGE: 15-Oct-1935 79 y.o. Admit date: 10/18/2014   Date of Consult: 10/19/2014 Primary Physician: Petra Kuba, MD Primary Cardiologist: [facility]  Chief Complaint: No chief complaint on file.  Reason for Consult: nonvalvular atrial fibrillation with long QT interval  HPI: 79 y.o. male with known significant multiple myeloma which is significantly worse than before and potentially causing significant new issues requiring medication management and/or chemotherapy. The patient has had acute renal failure with elevated creatinine for which the patient has had significant weakness and fatigue. He also has had essential hypertension for which the patient has received metoprolol as well as ACE inhibitor. Blood pressure has been well-controlled with these medication management. Additionally the patient has had known coronary artery disease status post history of congestive heart failure but no evidence of current congestive heart failure. The patient does have known coronary artery disease status post PCI stent placement apparently in the past which way he has not had any current evidence of anginal symptoms. The patient has been having paroxysmal nonvalvular atrial fibrillation for which the patient has been on T [place] and maintaining normal sinus rhythm with metoprolol. Current EKG with acute renal failure and continued use of these medications that showed sinus bradycardia with T wave inversion diffusely with prolonged QT interval. This is secondary to poor metabolism of these medications and need for discontinuation. The patient currently has not had any side effects or symptoms from the clinical standpoint. The patient remains on warfarin for further reduction of the stroke with atrial fibrillation but no evidence of recurrent bleeding complications. Atorvastatin has been continued for concerns of  hyperlipidemia and coronary artery disease without side effects  Past Medical History  Diagnosis Date  . A-fib   . Hypertension   . Benign prostatic hypertrophy   . History of gastrointestinal bleeding   . Hyperlipidemia   . History of multiple myeloma   . CAD (coronary artery disease)   . GERD (gastroesophageal reflux disease)   . CHF (congestive heart failure)   . Peptic ulcer disease   . Hyponatremia   . Multiple myeloma in relapse 10/17/2014  . Cancer     multiple myeloma      Most Recent Cardiac Studies:    Surgical History:  Past Surgical History  Procedure Laterality Date  . Cardiac catheterization    . Coronary angioplasty      [facility] Meds: Prior to Admission medications   Medication Sig Start Date End Date Taking? Authorizing Provider  atorvastatin (LIPITOR) 40 MG tablet Take 40 mg by mouth at bedtime.   Yes Historical Provider, MD  doxazosin (CARDURA) 2 MG tablet Take 2 mg by mouth at bedtime.   Yes Historical Provider, MD  glipiZIDE (GLUCOTROL XL) 10 MG 24 hr tablet Take 10 mg by mouth 2 (two) times daily.   Yes Historical Provider, MD  magnesium oxide (MAG-OX) 400 MG tablet Take 400 mg by mouth 2 (two) times daily.   Yes Historical Provider, MD  metoprolol tartrate (LOPRESSOR) 25 MG tablet Take 12.5 mg by mouth 2 (two) times daily.    Yes Historical Provider, MD  acetaminophen (TYLENOL) 325 MG tablet Take 650 mg by mouth every 4 (four) hours as needed.    Historical Provider, MD  cetirizine (ZYRTEC) 10 MG tablet Take 10 mg by mouth daily.    Historical Provider, MD  dicyclomine (BENTYL) 20 MG tablet Take 1  tablet (20 mg total) by mouth every 6 (six) hours. As needed for abdominal pain Patient not taking: Reported on 10/18/2014 10/13/14   Boris Lown, DO  dofetilide (TIKOSYN) 500 MCG capsule Take 500 mcg by mouth every 12 (twelve) hours.     Historical Provider, MD  enalapril (VASOTEC) 20 MG tablet Take 20 mg by mouth 2 (two) times daily.      Historical Provider, MD  insulin detemir (LEVEMIR) 100 UNIT/ML injection Inject 15 Units into the skin at bedtime.    Historical Provider, MD  lidocaine-prilocaine (EMLA) cream Apply 1 application topically once.    Historical Provider, MD  loperamide (IMODIUM A-D) 2 MG tablet Take 1 tablet (2 mg total) by mouth 4 (four) times daily as needed for diarrhea or loose stools. 10/16/14   Sheryl Precious Haws, DO  megestrol (MEGACE) 40 MG/ML suspension Take 40 mg by mouth daily.    Historical Provider, MD  ondansetron (ZOFRAN-ODT) 8 MG disintegrating tablet Take 1 tablet (8 mg total) by mouth every 8 (eight) hours as needed for nausea. Patient not taking: Reported on 10/18/2014 10/13/14   Boris Lown, DO  pantoprazole (PROTONIX) 40 MG tablet Take 40 mg by mouth daily.    Historical Provider, MD  ranitidine (ZANTAC) 150 MG tablet Take 150 mg by mouth 2 (two) times daily.    Historical Provider, MD  warfarin (COUMADIN) 1 MG tablet Take 2 mg by mouth at bedtime.    Historical Provider, MD  warfarin (COUMADIN) 5 MG tablet Take 5 mg by mouth at bedtime.    Historical Provider, MD    Inpatient Medications:  . atorvastatin  40 mg Oral QHS  . doxazosin  2 mg Oral QHS  . glipiZIDE  10 mg Oral BID AC  . insulin aspart  0-9 Units Subcutaneous TID WC  . lidocaine-prilocaine  1 application Topical Once  . loratadine  10 mg Oral Daily  . megestrol  40 mg Oral Daily  . metoprolol tartrate  12.5 mg Oral BID  . pantoprazole  40 mg Oral QAC breakfast  . warfarin  2 mg Oral QHS  . warfarin  5 mg Oral QHS  . Warfarin - Physician Dosing Inpatient   Does not apply q1800   . sodium chloride 150 mL/hr at 10/19/14 0820    Allergies:  Allergies  Allergen Reactions  . Aspirin Hives and Other (See Comments)    Reaction:  Causes pt to bleed.   . Celecoxib Other (See Comments)    Reaction:  Causes pt to bleed.     History   Social History  . Marital Status: Married    Spouse Name: N/A  . Number of Children: N/A   . Years of Education: N/A   Occupational History  . Not on file.   Social History Main Topics  . Smoking status: Never Smoker   . Smokeless tobacco: Not on file  . Alcohol Use: No  . Drug Use: No  . Sexual Activity: Not on file   Other Topics Concern  . Not on file   Social History Narrative     Family History  Problem Relation Age of Onset  . Heart disease Father      Review of Systems Positive for weakness and fatigue Negative for: General:  for chills, fever, night sweats or weight changes.  Cardiovascular: PND orthopnea syncope dizziness  Dermatological skin lesions rashes Respiratory: Cough congestion Urologic: Frequent urination urination at night and hematuria Abdominal: negative for nausea, vomiting, diarrhea,  bright red blood per rectum, melena, or hematemesis Neurologic: negative for visual changes, and/or hearing changes  All other systems reviewed and are otherwise negative except as noted above.  Labs: No results for input(s): CKTOTAL, CKMB, TROPONINI in the last 72 hours. Lab Results  Component Value Date   WBC 2.4* 10/19/2014   HGB 6.2* 10/19/2014   HCT 18.4* 10/19/2014   MCV 91.6 10/19/2014   PLT 26* 10/19/2014    Recent Labs Lab 10/19/14 0436  NA 134*  K 4.6  CL 113*  CO2 18*  BUN 34*  CREATININE 2.23*  CALCIUM 7.6*  PROT 10.5*  BILITOT 0.8  ALKPHOS 41  ALT 12*  AST 17  GLUCOSE 133*   No results found for: CHOL, HDL, LDLCALC, TRIG No results found for: DDIMER  Radiology/Studies:  Ct Abdomen Pelvis Wo Contrast  10/13/2014   CLINICAL DATA:  Abdominal pain for 24 hr. Acute renal failure. History of progressive multiple myeloma last seen in Oncology 09/16/2014. Congestive heart failure. History of COPD, GERD.  EXAM: CT ABDOMEN AND PELVIS WITHOUT CONTRAST  TECHNIQUE: Multidetector CT imaging of the abdomen and pelvis was performed following the standard protocol without IV contrast.  COMPARISON:  03/11/2013  FINDINGS: Lower chest: Mild  scarring at the left lung base. There are emphysematous changes in within the lung bases. Heart size is normal.  Upper abdomen: There is a small amount of perihepatic fluid. Liver otherwise is normal in appearance. No focal abnormality identified within the spleen, pancreas, or adrenal glands. Kidneys have a normal appearance. No intrarenal or ureteral stones. Gallbladder is present.  Gastrointestinal tract: Marked thickening of numerous small bowel loops, associated with mesenteric fluid especially within the right lower quadrant. There is no associated obstruction. There are skip areas of normal appearing small bowel, suggesting involvement by Crohn's disease. The stomach has a normal appearance. There are colonic diverticula. Although there is a small amount of fluid around the colon there is no evidence for inflammation arising from the colon. Appendix is normal.  There is hazy density within the mesentery, associated with small but numerous lymph nodes as seen on prior study. Postoperative clips are identified within the mesenteric.  Pelvis: Free pelvic fluid is present. The urinary bladder has a normal appearance. Prostate and seminal vesicles are normal in appearance. No pelvic adenopathy.  Retroperitoneum: Small, nonspecific retroperitoneal lymph nodes.  Abdominal wall: Unremarkable.  Osseous structures: Degenerative changes are seen in the lower spine. No suspicious lytic or blastic lesions identified.  IMPRESSION: 1. Significant abnormality of multiple small bowel loops, associated with significant mesenteric fluid especially involving the right lower quadrant loops. Considerations include amyloidosis, Crohn's disease, infectious or inflammatory etiologies, lymphoma, vasculitis. 2. Colonic diverticulosis.  No diverticulitis. 3. Small amount of pelvic ascites and perihepatic fluid. 4. Hazy density within the mesentery associated with small, numerous lymph nodes. Findings are possibly inflammatory and appear  relatively stable. 5. No abscess.   Electronically Signed   By: Nolon Nations M.D.   On: 10/13/2014 17:10    EKG: Sinus bradycardia with diffuse T-wave inversions and long QT interval  Weights: [employer]   10/18/14 1559  Weight: 159 lb (72.122 kg)     Physical Exam: Blood pressure 168/83, pulse 78, temperature 98.9 F (37.2 C), temperature source Oral, resp. rate 20, height 5' 8"  (1.727 m), weight 159 lb (72.122 kg), SpO2 100 %. Body mass index is 24.18 kg/(m^2). General: Well developed, well nourished, in no acute distress. Head eyes ears nose throat: Normocephalic, atraumatic,  sclera non-icteric, no xanthomas, nares are without discharge. No apparent thyromegaly and/or mass  Lungs: Normal respiratory effort. Clear bilaterally to auscultation without wheezes, rales, or rhonchi.  Heart: RRR with normal S1 S2. Without murmur gallop or rub PMI is normal size and placement carotid upstroke normal without bruit jugular venous pressure is normal Abdomen: Soft, non-tender, non-distended with normoactive bowel sounds. No hepatomegaly. No rebound/guarding. No obvious abdominal masses. Abdominal aorta is normal size without bruit Extremities: No edema cyanosis clubbing or ulcers  Peripheral : 2+ bilateral upper extremity pulses, 2+ bilateral femoral pulses 2+ bilateral dorsal pedal pulse Neuro: Alert and oriented X 3. No facial asymmetry. No focal deficit. Moves all extremities spontaneously. Musculoskeletal: Normal muscle tone without kyphosis Psych:  Responds to questions appropriately with a normal affect.    Assessment: 79 year old male with known multiple myeloma and exacerbation with significant acute renal failure alveolar atrial fibrillation with Tikosyn and use and higher levels with long QT interval essential hypertension coronary artery disease status post previous PCI and stent placement further medical management and treatment options  Plan: 1. Discontinuation of Tikosyn due  to acute renal failure and long QT interval 2. Discontinuation of ACE inhibitor due to acute renal failure 3. Continue metoprolol for heart rate control of atrial fibrillation and possible maintenance insomnia normal sinus rhythm 4. Anticoagulation unless contraindicated or risk reduction of throat with possible atrial fibrillation 5. Atorvastatin for moderate to high intensity cholesterol therapy unless contraindicated due to acute weakness and fatigue and or multiple myeloma in excellent  6. Echocardiogram for LV systolic dysfunction 7. Further treatment options after above Signed, Corey Skains M.D. [facility] 10/19/2014, 10:08 AM

## 2014-10-19 NOTE — Plan of Care (Signed)
Problem: Discharge Progression Outcomes Goal: Discharge plan in place and appropriate individualization  Outcome: Progressing Pt appetite improved with pt eating increased % of meals; glucerna started and tolerated well. Pain controlled with oxycodone with tylenol/benadryl pre blood meds also beneficial. Up to BR with SB+ with slightly unsteady gait- See PT consult note. High risk fall interventions followed. Nephrology consult done with renal US completed. Pt has received 1 unit PRBC's with 2nd unit transfusing currently and tolerating well.

## 2014-10-19 NOTE — Consult Note (Signed)
[facility] NAME: Peter Walters    MR#:  [id number]  DATE OF BIRTH:  1935/07/10  DATE OF ADMISSION:  10/18/2014  PRIMARY CARE PHYSICIAN: Petra Kuba, MD   REQUESTING/REFERRING PHYSICIAN: Nolon Stalls, M.D  CHIEF COMPLAINT:  Generalised weakness  HISTORY OF PRESENT ILLNESS:  Peter Walters  is a 79 y.o. male with a known history of Recurring Multiple myeloma on chemo currently, Afib on coumadin, HTN, BPH, CAD s/p PCI, DM, GERD admitted for generalised weakness for 2-3 weeks now but worse to the pint that he was not able to even get up out of bed prior to admission. Noted to have acute on chronic renal failure and also anemia and admitted from cancer center. Medical consult requested for medical mgmt. Poor appetite noted as well.   PAST MEDICAL HISTORY:   Past Medical History  Diagnosis Date  . A-fib   . Hypertension   . Benign prostatic hypertrophy   . History of gastrointestinal bleeding   . Hyperlipidemia   . History of multiple myeloma   . CAD (coronary artery disease)   . GERD (gastroesophageal reflux disease)   . CHF (congestive heart failure)   . Peptic ulcer disease   . Hyponatremia   . Multiple myeloma in relapse 10/17/2014  . Cancer     multiple myeloma    PAST SURGICAL HISTOIRY:   Past Surgical History  Procedure Laterality Date  . Cardiac catheterization    . Coronary angioplasty      [facility]    SOCIAL HISTORY:   History  Substance Use Topics  . Smoking status: Never Smoker   . Smokeless tobacco: Not on file  . Alcohol Use: No    FAMILY HISTORY:   Family History  Problem Relation Age of Onset  . Heart disease Father   . Kidney failure Brother     DRUG ALLERGIES:   Allergies  Allergen Reactions  . Aspirin Hives and Other (See Comments)    Reaction:  Causes pt to bleed.   . Celecoxib Other (See Comments)    Reaction:  Causes pt to bleed.     REVIEW OF SYSTEMS:   CONSTITUTIONAL: No fever, Positive for  Fatigue and weakness.  EYES: No blurred or double vision. 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  RESPIRATORY: No cough, shortness of breath, wheezing or hemoptysis.  CARDIOVASCULAR: No chest pain, orthopnea, edema.  GASTROINTESTINAL: No nausea, vomiting, diarrhea or abdominal pain.  GENITOURINARY: No dysuria, hematuria.  ENDOCRINE: No polyuria, nocturia,  HEMATOLOGY: No anemia, easy bruising or bleeding SKIN: No rash or lesion. MUSCULOSKELETAL: No joint pain or arthritis.   NEUROLOGIC: Positive for tingling and numbness for both legs and both hands since his chemo.  PSYCHIATRY: No anxiety or depression.   MEDICATIONS AT HOME:   Prior to Admission medications   Medication Sig Start Date End Date Taking? Authorizing Provider  atorvastatin (LIPITOR) 40 MG tablet Take 40 mg by mouth at bedtime.   Yes Historical Provider, MD  doxazosin (CARDURA) 2 MG tablet Take 2 mg by mouth at bedtime.   Yes Historical Provider, MD  glipiZIDE (GLUCOTROL XL) 10 MG 24 hr tablet Take 10 mg by mouth 2 (two) times daily.   Yes Historical Provider, MD  magnesium oxide (MAG-OX) 400 MG tablet Take 400 mg by mouth 2 (two) times daily.   Yes Historical Provider, MD  metoprolol tartrate (LOPRESSOR) 25 MG tablet  Take 12.5 mg by mouth 2 (two) times daily.    Yes Historical Provider, MD  acetaminophen (TYLENOL) 325 MG tablet Take 650 mg by mouth every 4 (four) hours as needed.    Historical Provider, MD  cetirizine (ZYRTEC) 10 MG tablet Take 10 mg by mouth daily.    Historical Provider, MD  dicyclomine (BENTYL) 20 MG tablet Take 1 tablet (20 mg total) by mouth every 6 (six) hours. As needed for abdominal pain Patient not taking: Reported on 10/18/2014 10/13/14   Boris Lown, DO  dofetilide (TIKOSYN) 500 MCG capsule Take 500 mcg by mouth every 12 (twelve) hours.     Historical Provider, MD  enalapril (VASOTEC) 20 MG tablet Take 20 mg by  mouth 2 (two) times daily.     Historical Provider, MD  insulin detemir (LEVEMIR) 100 UNIT/ML injection Inject 15 Units into the skin at bedtime.    Historical Provider, MD  lidocaine-prilocaine (EMLA) cream Apply 1 application topically once.    Historical Provider, MD  loperamide (IMODIUM A-D) 2 MG tablet Take 1 tablet (2 mg total) by mouth 4 (four) times daily as needed for diarrhea or loose stools. 10/16/14   Sheryl Precious Haws, DO  megestrol (MEGACE) 40 MG/ML suspension Take 40 mg by mouth daily.    Historical Provider, MD  ondansetron (ZOFRAN-ODT) 8 MG disintegrating tablet Take 1 tablet (8 mg total) by mouth every 8 (eight) hours as needed for nausea. Patient not taking: Reported on 10/18/2014 10/13/14   Boris Lown, DO  pantoprazole (PROTONIX) 40 MG tablet Take 40 mg by mouth daily.    Historical Provider, MD  ranitidine (ZANTAC) 150 MG tablet Take 150 mg by mouth 2 (two) times daily.    Historical Provider, MD  warfarin (COUMADIN) 1 MG tablet Take 2 mg by mouth at bedtime.    Historical Provider, MD  warfarin (COUMADIN) 5 MG tablet Take 5 mg by mouth at bedtime.    Historical Provider, MD      VITAL SIGNS:  Blood pressure 151/76, pulse 83, temperature 100.3 F (37.9 C), temperature source Oral, resp. rate 20, height _0  (1.727 m), weight 72.122 kg (159 lb), SpO2 100 %.  PHYSICAL EXAMINATION:  GENERAL:  79 y.o.-year-old patient lying in the bed with no acute distress.  EYES: Pupils post surgical, equal, round, reactive to light and accommodation. No scleral icterus. Extraocular muscles intact.  HEENT: Head atraumatic, normocephalic. Oropharynx and nasopharynx clear.  NECK:  Supple, no jugular venous distention. No thyroid enlargement, no tenderness.  LUNGS: Normal breath sounds bilaterally, no wheezing, rales,rhonchi or crepitation. No use of accessory muscles of respiration.  CARDIOVASCULAR: S1, S2 normal. 3/6 systolic murmur present, No rubs, or gallops.  ABDOMEN: Soft, nontender,  nondistended. Bowel sounds present. No organomegaly or mass.  EXTREMITIES: No pedal edema, cyanosis, or clubbing.  NEUROLOGIC: Cranial nerves II through XII are intact. Muscle strength 5/5 in all extremities. Sensation intact. Gait not checked. Generalized weakness present PSYCHIATRIC: The patient is alert and oriented x 3.  SKIN: No obvious rash, lesion, or ulcer.   LABORATORY PANEL:   CBC  Recent Labs Lab 10/19/14 0436  WBC 2.4*  HGB 6.2*  HCT 18.4*  PLT 26*   ------------------------------------------------------------------------------------------------------------------  Chemistries   Recent Labs Lab 10/18/14 1107 10/19/14 0436  NA 130* 134*  K 4.7 4.6  CL 108 113*  CO2 18* 18*  GLUCOSE 161* 133*  BUN 44* 34*  CREATININE 2.77* 2.23*  CALCIUM 8.4* 7.6*  MG 2.1  --  AST 19 17  ALT 14* 12*  ALKPHOS 53 41  BILITOT 0.7 0.8   ------------------------------------------------------------------------------------------------------------------  Cardiac Enzymes No results for input(s): TROPONINI in the last 168 hours. ------------------------------------------------------------------------------------------------------------------  RADIOLOGY:  No results found.  EKG:   Orders placed or performed during the hospital encounter of 10/18/14  . EKG 12-Lead  . EKG 12-Lead    IMPRESSION AND PLAN:   78y/oM with multiple myeloma on chemo, HTN, DM, CAD s/p PCI, Afib on coumadin, BPH, GERD admitted for geenralised weakness and noted to have acute on chronic renal failure and anemia. Medical consult requested for Medical management  1. Acute anemia- secondary to multiple myeloma progressing likely. Mgmt per oncology, Transfuse for hb <7, 1unit being transfused this am Hold coumadin- likely not a good candidate for warfarin with significant anemia  2. Acute on chronic renal failure- baseline cr of 2.0, at 2.7 on adm Prerenal likely and also multiple myeloma IV fluids,  renal u/s Nephrology consulted Hold ACEI  3. Throbocytopenia- chronic, monitor, Tx plts if plt count <20k  4. CAD s/p PCI- cardiology following, on  metoprolol and statin. No asa due to anemia and thrombocytopenia  5. DM- on glipizide and ssi  6. Multiple Myeloma- Progressive multiple myeloma, on third line therapy. Overall poor prognosis. Mgmt per oncology team Agree with palliative care consult  7. Afib- Tikosyn held due to renal failure and prolonged QT, Adjust elecrtolytes Rate controlled with metoprolol, coumadin held as well due to anemia  8. DVT prophylaxis- TEDs, SCDs     All the records are reviewed and case discussed with Consulting provider. Management plans discussed with the patient, family and they are in agreement.  CODE STATUS: Full Code  TOTAL TIME TAKING CARE OF THIS PATIENT: 50 minutes.    Gladstone Lighter M.D on 10/19/2014 at 2:55 PM  Between 7am to 6pm - Pager - [phone]  After 6pm go to [email or web address] - password [email or web address] [facility]  Office  [phone]  CC: Primary care Physician: Petra Kuba, MD

## 2014-10-19 NOTE — Progress Notes (Signed)
Initial Nutrition Assessment  INTERVENTION: Medical Nutrition supplement Therapy: will recommend Glucerna shake BID for added nutrition Meals and Snacks: Cater to patient preferences  NUTRITION DIAGNOSIS:  Inadequate oral intake related to cancer and cancer related treatments as evidenced by estimated needs.  GOAL:  Patient will meet greater than or equal to 90% of their needs  MONITOR:  PO intake, Supplement acceptance  REASON FOR ASSESSMENT:  Consult Assessment of nutrition requirement/status  ASSESSMENT:  Pt admitted with acute renal failure with h/o multiple myeloma stage III PMHx; afib, HTN, BPH, GI bleed, HLD, MM, CAD, GERD, CHF, PUD, hypobnatremia  Medications: Megace, Glucotrol, Novolog, Protonix, Coumadin, NS at 152m/hr  PO Intake: pt eating on visit. Pt reports eating ' a good breakfast this morning!' Pt reports poor po intake 1 day  PTA. Pt reports stopping on the way to the hospital as we was hungry and ate 100% of gravy biscuit from community.  Labs: Electrolyte and Renal Profile:    Recent Labs Lab 10/13/14 1900 10/18/14 1107 10/19/14 0436  BUN 49* 44* 34*  CREATININE 2.33* 2.77* 2.23*  NA 128* 130* 134*  K 5.6* 4.7 4.6  MG  --  2.1  --    Glucose Profile:  Recent Labs  10/18/14 2213 10/19/14 0732 10/19/14 1107  GLUCAP 218* 101* 192*   Protein Profile:  Recent Labs Lab 10/13/14 1900 10/18/14 1107 10/19/14 0436  ALBUMIN 2.7* 3.1* 2.6*   Pt reports UBW of 162lbs and has been stable.  Height:  Ht Readings from Last 1 Encounters:  10/18/14 5' 8"  (1.727 m)    Weight:  Wt Readings from Last 1 Encounters:  10/18/14 159 lb (72.122 kg)    Ideal Body Weight:     Wt Readings from Last 10 Encounters:  10/18/14 159 lb (72.122 kg)  10/13/14 163 lb (73.936 kg)    BMI:  Body mass index is 24.18 kg/(m^2).  Estimated Nutritional Needs:  Kcal:  1873-2215kcals, BEE: 1419kcals, (IF 1.1-1.3)(AF 1.2)  Protein:  72-86g protein  (1.0-1.2g/kg)  Fluid:  800-21625mof fluid (25-3074mg)  Diet Order:  Diet regular Room service appropriate?: Yes; Fluid consistency:: Thin  EDUCATION NEEDS:  Education needs no appropriate at this time   Intake/Output Summary (Last 24 hours) at 10/19/14 1256 Last data filed at 10/19/14 1208  Gross per 24 hour  Intake   2386 ml  Output    626 ml  Net   1760 ml    Last BM:  5/7  MODERATE Care Level  AllDwyane LuoD, LDN Pager ([phone]

## 2014-10-19 NOTE — Plan of Care (Signed)
Problem: Discharge Progression Outcomes Goal: Discharge plan in place and appropriate individualization  Address patient as Peter Walters.   Lives at home with wife. History of multiple myeloma,DM,HTN, controlled with home medications.    Goal: Other Discharge Outcomes/Goals Outcome: Progressing Plan of Care Progressing to Goal: Acute Renal Failure- Patient receiving IV fluids. Tolerating diet. No complaints of pain. Off unit telemetry NSR, HR 80's.

## 2014-10-19 NOTE — Evaluation (Signed)
Physical Therapy Evaluation Patient Details Name: Peter Walters MRN: [id number] DOB: 1936-03-10 Today's Date: 10/19/2014   History of Present Illness  Patient is a 79 y.o. male with stage III multiple myeloma, diagnosed in 2013, who is admitted from the oncology clinic with progressive weakness and worsening renal insufficiency. Patient has h/o CAD and CHF.  Clinical Impression  Patient's functional mobility is limited by his shortness of breath and impaired balance.Patient is scheduled to receive blood to treat low hemoglobin which may improve exercise tolerance. Patient will benefit from skilled PT services to improve his functional mobility and reduce his risk of injurious fall.    Follow Up Recommendations Home health PT;Supervision for mobility/OOB    Equipment Recommendations       Recommendations for Other Services       Precautions / Restrictions Precautions Precautions: Fall Restrictions Weight Bearing Restrictions: No      Mobility  Bed Mobility Overal bed mobility: Independent (without use of bedrails)                Transfers Overall transfer level: Needs assistance Equipment used: Rolling walker (2 wheeled) (or handheld assistance) Transfers: Sit to/from Stand Sit to Stand: Min guard         General transfer comment: Pt leverages lower legs against locked bed when standing and stands with wide BOS. Demonstrates occasional poorly controlled stand to sit. Patient instructed in trunk flexion prior to standing and sitting for improved sit-to-stand control.  Ambulation/Gait Ambulation/Gait assistance: Modified independent (Device/Increase time);Supervision Ambulation Distance (Feet): 150 Feet Assistive device: Rolling walker (2 wheeled) Gait Pattern/deviations: Step-through pattern;Decreased dorsiflexion - right Gait velocity: 0.8 ft/sec self-selected speed and 1.4 ft/sec max gait velocity, both indicative of risk for recurrent falls   General Gait  Details: Gait training 75 ft x2 without assistive device and CGA with occasional min assist for LOB. Decreased trunk rotation during ambulation.  Patient reported DOE of 3/8 after 150 ft ambulation with FWW and 8/10 after 75 ft ambulation with CGA (using faces scale). O2 sat remained 98-100%.  Stairs            Wheelchair Mobility    Modified Rankin (Stroke Patients Only)       Balance Overall balance assessment: Needs assistance   Sitting balance-Leahy Scale: Normal         Standing balance comment: wide base of support, eyes closed, 15 sec. Tends to lose balance posteriorly and to the right. Single Leg Stance - Right Leg: 5 Single Leg Stance - Left Leg: 2 Tandem Stance - Right Leg: 2 (LOB while attaining tandem stance, required asst to recover)                       Pertinent Vitals/Pain Pain Assessment: No/denies pain    Home Living Family/patient expects to be discharged to:: Private residence Living Arrangements: Spouse/significant other Available Help at Discharge: Family;Other (Comment) (Pt and wife report pt will have 24 hr assistance at home) Type of Home: House Home Access: Stairs to enter Entrance Stairs-Rails: Right;Left;Can reach both Entrance Stairs-Number of Steps: 5 Home Layout: One level Home Equipment: Walker - 2 wheels;Cane - single point;Wheelchair - manual      Prior Function Level of Independence: Independent               Hand Dominance        Extremity/Trunk Assessment   Upper Extremity Assessment: Overall WFL for tasks assessed  Lower Extremity Assessment: Overall WFL for tasks assessed (bilat hip abd 4/5;otherwise 5/5)         Communication   Communication: HOH  Cognition Arousal/Alertness: Awake/alert Behavior During Therapy: WFL for tasks assessed/performed Overall Cognitive Status: Within Functional Limits for tasks assessed                      General Comments      Exercises  Other Exercises Other Exercises: FTSTS in 17 seconds with patient leveraging lower legs against locked bed. Repeated sit to stands with instruction to correct form.      Assessment/Plan    PT Assessment Patient needs continued PT services  PT Diagnosis Difficulty walking;Other (comment) (Impaired static and dynamic standing balance)   PT Problem List Decreased activity tolerance;Decreased balance;Cardiopulmonary status limiting activity  PT Treatment Interventions Gait training   PT Goals (Current goals can be found in the Care Plan section) Acute Rehab PT Goals Patient Stated Goal: "I want to be able to get up and go places. I know I won't ever be doing as good as I used to be, but if I could get out in the yard and go fishing." PT Goal Formulation: With patient/family ((wife)) Time For Goal Achievement: 11/02/14 Potential to Achieve Goals: Good    Frequency Min 2X/week   Barriers to discharge        Co-evaluation               End of Session Equipment Utilized During Treatment: Gait belt Activity Tolerance: Patient limited by fatigue Patient left: in bed;with call bell/phone within reach;with bed alarm set;with family/visitor present Nurse Communication: Mobility status         Time: 9396-8864 PT Time Calculation (min) (ACUTE ONLY): 47 min   Charges:   PT Evaluation $Initial PT Evaluation Tier I: 1 Procedure PT Treatments $Gait Training: 8-22 mins   PT G Codes:        Cohl Behrens A Luise Yamamoto 10/19/2014, 12:19 PM

## 2014-10-19 NOTE — Progress Notes (Signed)
Dr. Toy Baker notified of patients QTs>500 with a request for cardiology consult from Dr. Alcus Dad. New orders received to hold Tikosyn and Vasotec. Also requested that I page Dr. Alcus Dad cell number to his pager and he will return the call and speak with her about the pt.

## 2014-10-20 DIAGNOSIS — D630 Anemia in neoplastic disease: Secondary | ICD-10-CM | POA: Insufficient documentation

## 2014-10-20 DIAGNOSIS — R634 Abnormal weight loss: Secondary | ICD-10-CM | POA: Insufficient documentation

## 2014-10-20 LAB — GLUCOSE, CAPILLARY
GLUCOSE-CAPILLARY: 189 mg/dL — AB (ref 70–99)
Glucose-Capillary: 87 mg/dL (ref 70–99)
Glucose-Capillary: 217 mg/dL — ABNORMAL HIGH (ref 70–99)
Glucose-Capillary: 148 mg/dL — ABNORMAL HIGH (ref 70–99)

## 2014-10-20 LAB — BASIC METABOLIC PANEL
Anion gap: 2 — ABNORMAL LOW (ref 5–15)
BUN: 27 mg/dL — ABNORMAL HIGH (ref 6–20)
CO2: 16 mmol/L — ABNORMAL LOW (ref 22–32)
Calcium: 7.7 mg/dL — ABNORMAL LOW (ref 8.9–10.3)
Chloride: 114 mmol/L — ABNORMAL HIGH (ref 101–111)
Creatinine, Ser: 1.85 mg/dL — ABNORMAL HIGH (ref 0.61–1.24)
GFR calc Af Amer: 39 mL/min — ABNORMAL LOW (ref >60)
GFR calc non Af Amer: 34 mL/min — ABNORMAL LOW (ref >60)
Glucose, Bld: 142 mg/dL — ABNORMAL HIGH (ref 65–99)
Potassium: 4 mmol/L (ref 3.5–5.1)
Sodium: 132 mmol/L — ABNORMAL LOW (ref 135–145)

## 2014-10-20 LAB — CBC WITH DIFFERENTIAL/PLATELET
Basophils Absolute: 0 10*3/uL (ref 0–0.1)
Basophils Relative: 1 %
Eosinophils Absolute: 0.2 10*3/uL (ref 0–0.7)
Eosinophils Relative: 5 %
HCT: 24 % — ABNORMAL LOW (ref 40.0–52.0)
Hemoglobin: 8.5 g/dL — ABNORMAL LOW (ref 13.0–18.0)
Lymphocytes Relative: 31 %
Lymphs Abs: 1.1 10*3/uL (ref 1.0–3.6)
MCH: 31.1 pg (ref 26.0–34.0)
MCHC: 35.2 g/dL (ref 32.0–36.0)
MCV: 88.3 fL (ref 80.0–100.0)
Monocytes Absolute: 0.3 10*3/uL (ref 0.2–1.0)
Monocytes Relative: 10 %
Neutro Abs: 1.8 10*3/uL (ref 1.4–6.5)
Neutrophils Relative %: 53 %
Platelets: 28 10*3/uL — CL (ref 150–440)
RBC: 2.72 MIL/uL — ABNORMAL LOW (ref 4.40–5.90)
RDW: 18.8 % — ABNORMAL HIGH (ref 11.5–14.5)
WBC: 3.4 10*3/uL — ABNORMAL LOW (ref 3.8–10.6)

## 2014-10-20 LAB — PROTIME-INR
INR: 1.85
Prothrombin Time: 21.5 seconds — ABNORMAL HIGH (ref 11.4–15.0)

## 2014-10-20 LAB — PHOSPHORUS: PHOSPHORUS: 1.6 mg/dL — AB (ref 2.5–4.6)

## 2014-10-20 MED ORDER — CALCIUM CARBONATE-VITAMIN D 500-200 MG-UNIT PO TABS
1.0000 | ORAL_TABLET | Freq: Every day | ORAL | Status: DC
  Administered 2014-10-21 – 2014-10-24 (×4): 1 via ORAL
  Filled 2014-10-20 (×4): qty 1

## 2014-10-20 MED ORDER — AMLODIPINE BESYLATE 10 MG PO TABS
10.0000 mg | ORAL_TABLET | Freq: Every day | ORAL | Status: DC
  Administered 2014-10-20 – 2014-10-21 (×2): 10 mg via ORAL
  Filled 2014-10-20 (×2): qty 1

## 2014-10-20 MED ORDER — AMLODIPINE BESYLATE 5 MG PO TABS
5.0000 mg | ORAL_TABLET | Freq: Once | ORAL | Status: AC
  Administered 2014-10-20: 02:00:00 5 mg via ORAL
  Filled 2014-10-20: qty 1

## 2014-10-20 MED ORDER — METOPROLOL TARTRATE 25 MG PO TABS
25.0000 mg | ORAL_TABLET | ORAL | Status: AC
  Administered 2014-10-20: 25 mg via ORAL
  Filled 2014-10-20: qty 1

## 2014-10-20 MED ORDER — METOPROLOL TARTRATE 25 MG PO TABS
25.0000 mg | ORAL_TABLET | Freq: Two times a day (BID) | ORAL | Status: DC
  Administered 2014-10-20 – 2014-10-21 (×3): 25 mg via ORAL
  Filled 2014-10-20 (×3): qty 1

## 2014-10-20 NOTE — Progress Notes (Signed)
[facility] Encounter Note  Patient: Peter Walters / Admit Date: 10/18/2014 / Date of Encounter: 10/20/2014, 8:49 AM   Subjective: Weak and fatigued with mild shortness of breath  Review of Systems: Positive for: Fatigue and shortness of breath Negative for: Others previously listed  Objective: Telemetry: None Physical Exam: Blood pressure 169/78, pulse 79, temperature 99.1 F (37.3 C), temperature source Oral, resp. rate 20, height 5' 8"  (1.727 m), weight 168 lb 14.4 oz (76.613 kg), SpO2 99 %. Body mass index is 25.69 kg/(m^2). General: Well developed, well nourished, in no acute distress. Head: Normocephalic, atraumatic, sclera non-icteric, no xanthomas, nares are without discharge. Neck: No apparent masses Lungs: Normal respirations with few wheezes, no rhonchi, few Rales and/or crackles   Heart: Regular rate and rhythm, normal S1-S2, no murmur, no rub, no gallop, PMI is normal size and placement, carotid upstroke normal without bruit, jugular venous pressure normal Abdomen: Soft, non-tender, non-distended with normoactive bowel sounds. No apparent hepatosplenomegaly. Abdominal aorta is normal size without bruit Extremities: No edema, no clubbing, no cyanosis, no ulcers,  Peripheral: 2+ radial, 2+ femoral, 2+ dorsal pedal pulses Neuro: Alert and oriented X 3. Moves all extremities spontaneously. Psych:  Responds to questions appropriately with a normal affect.   Intake/Output Summary (Last 24 hours) at 10/20/14 0849 Last data filed at 10/20/14 0730  Gross per 24 hour  Intake   3007 ml  Output   3075 ml  Net    -68 ml    Inpatient Medications:  . amLODipine  10 mg Oral Daily  . atorvastatin  40 mg Oral QHS  . doxazosin  2 mg Oral QHS  . feeding supplement (GLUCERNA SHAKE)  237 mL Oral BID WC  . glipiZIDE  10 mg Oral BID AC  . insulin aspart  0-9 Units Subcutaneous TID WC  . lidocaine-prilocaine  1 application Topical Once  . loratadine  10 mg Oral  Daily  . megestrol  40 mg Oral Daily  . metoprolol tartrate  12.5 mg Oral BID  . pantoprazole  40 mg Oral QAC breakfast   Infusions:  . sodium chloride 150 mL/hr at 10/20/14 7858    Labs:  Recent Labs  10/18/14 1107 10/19/14 0436 10/20/14 0435  NA 130* 134* 132*  K 4.7 4.6 4.0  CL 108 113* 114*  CO2 18* 18* 16*  GLUCOSE 161* 133* 142*  BUN 44* 34* 27*  CREATININE 2.77* 2.23* 1.85*  CALCIUM 8.4* 7.6* 7.7*  MG 2.1  --   --   PHOS  --   --  1.6*    Recent Labs  10/18/14 1107 10/19/14 0436  AST 19 17  ALT 14* 12*  ALKPHOS 53 41  BILITOT 0.7 0.8  PROT >12.0* 10.5*  ALBUMIN 3.1* 2.6*    Recent Labs  10/18/14 1107 10/19/14 0436 10/20/14 0435  WBC 3.7* 2.4* 3.4*  NEUTROABS 1.5  --  1.8  HGB 7.4* 6.2* 8.5*  HCT 21.7* 18.4* 24.0*  MCV 91.5 91.6 88.3  PLT 32* 26* 28*   No results for input(s): CKTOTAL, CKMB, TROPONINI in the last 72 hours. Invalid input(s): POCBNP No results for input(s): HGBA1C in the last 72 hours.   Weights: Filed Weights   10/18/14 1559 10/20/14 0501  Weight: 159 lb (72.122 kg) 168 lb 14.4 oz (76.613 kg)     Radiology/Studies:  Ct Abdomen Pelvis Wo Contrast  10/13/2014   CLINICAL DATA:  Abdominal pain for 24 hr. Acute renal failure. History of progressive multiple  myeloma last seen in Oncology 09/16/2014. Congestive heart failure. History of COPD, GERD.  EXAM: CT ABDOMEN AND PELVIS WITHOUT CONTRAST  TECHNIQUE: Multidetector CT imaging of the abdomen and pelvis was performed following the standard protocol without IV contrast.  COMPARISON:  03/11/2013  FINDINGS: Lower chest: Mild scarring at the left lung base. There are emphysematous changes in within the lung bases. Heart size is normal.  Upper abdomen: There is a small amount of perihepatic fluid. Liver otherwise is normal in appearance. No focal abnormality identified within the spleen, pancreas, or adrenal glands. Kidneys have a normal appearance. No intrarenal or ureteral stones.  Gallbladder is present.  Gastrointestinal tract: Marked thickening of numerous small bowel loops, associated with mesenteric fluid especially within the right lower quadrant. There is no associated obstruction. There are skip areas of normal appearing small bowel, suggesting involvement by Crohn's disease. The stomach has a normal appearance. There are colonic diverticula. Although there is a small amount of fluid around the colon there is no evidence for inflammation arising from the colon. Appendix is normal.  There is hazy density within the mesentery, associated with small but numerous lymph nodes as seen on prior study. Postoperative clips are identified within the mesenteric.  Pelvis: Free pelvic fluid is present. The urinary bladder has a normal appearance. Prostate and seminal vesicles are normal in appearance. No pelvic adenopathy.  Retroperitoneum: Small, nonspecific retroperitoneal lymph nodes.  Abdominal wall: Unremarkable.  Osseous structures: Degenerative changes are seen in the lower spine. No suspicious lytic or blastic lesions identified.  IMPRESSION: 1. Significant abnormality of multiple small bowel loops, associated with significant mesenteric fluid especially involving the right lower quadrant loops. Considerations include amyloidosis, Crohn's disease, infectious or inflammatory etiologies, lymphoma, vasculitis. 2. Colonic diverticulosis.  No diverticulitis. 3. Small amount of pelvic ascites and perihepatic fluid. 4. Hazy density within the mesentery associated with small, numerous lymph nodes. Findings are possibly inflammatory and appear relatively stable. 5. No abscess.   Electronically Signed   By: Nolon Nations M.D.   On: 10/13/2014 17:10   US Renal  10/19/2014   CLINICAL DATA:  Acute renal failure, history atrial fibrillation, hypertension, multiple myeloma, coronary artery disease, CHF, diabetes  EXAM: RENAL / URINARY TRACT ULTRASOUND COMPLETE  COMPARISON:  CT abdomen and pelvis  10/13/2014  FINDINGS: Right Kidney:  Length: 10.7 cm. Normal cortical thickness. Upper normal cortical echogenicity. No mass, hydronephrosis or shadowing calcification. No perinephric fluid.  Left Kidney:  Length: 10.6 cm. Normal cortical thickness with upper normal cortical echogenicity. No mass, hydronephrosis or shadowing calcification. No perinephric fluid.  Bladder:  Normal appearance.  IMPRESSION: No renal sonographic abnormalities identified.   Electronically Signed   By: Lavonia Dana M.D.   On: 10/19/2014 15:43     Assessment and Recommendation  79 y.o. male with known paroxysmal nonvalvular atrial fibrillation essential hypertension coronary artery disease status post history of PCI and stent placement having an acute renal failure and malignant hypertension 1. Continue metoprolol for heart rate control and maintenance of normal sinus rhythm without change today 2. Addition of amlodipine 10 mg each day for malignant hypertension 3. Avoid ACE inhibitor due to acute renal failure 4. No further intervention of coronary artery disease currently stable without evidence of symptoms and/or myocardial infarction 5. Discontinuation and avoid Tikosyn due to acute renal failure and concerns of side effects of the medication 6. Discontinuation and avoid anticoagulation due to low platelets and maintenance of normal sinus rhythm 7. Continue supportive care of concerns of  multiple myeloma and acute renal failure 8. No further cardiac diagnostics necessary at this time  Signed, Serafina Royals M.D. FACC

## 2014-10-20 NOTE — Plan of Care (Signed)
Problem: Discharge Progression Outcomes Goal: Other Discharge Outcomes/Goals Outcome: Progressing Pt drinking glucerna supplement. Appetite less today, but tolerating diet. SSI required to cover FSBS's slightly more elevated today. IVF's rate reduced.Pt had short run PAT, a.fib with sinus arrhythia- asymptomatic with MD notified with stat dose of metoprolol given with dose adjusted up to bid.Pt impulsive with activation of bed alarm for urgent urination with unstable balance- reinforced and educated pt/wife to call for assistance.Oxycodone x 1 given for chronic frontal HA. Up at bedside at intervals. Creatinine improved.

## 2014-10-20 NOTE — Consult Note (Signed)
[facility] [place] follow up   PATIENT NAME: Peter Walters    MR#:  [id number]  DATE OF BIRTH:  [birth year]  DATE OF ADMISSION:  10/18/2014  PRIMARY CARE PHYSICIAN: Petra Kuba, MD   REQUESTING/REFERRING PHYSICIAN: Nolon Stalls, M.D  CHIEF COMPLAINT:  Lonnie Reth  is a 79 y.o. male with a known history of Recurring Multiple myeloma on chemo currently, Afib on coumadin, HTN, BPH, CAD s/p PCI, DM, GERD admitted for generalised weakness for 2-3 weeks now but worse to the pint that he was not able to even get up out of bed prior to admission.  No acute complaints this morning.  Has been sitting up. Did not eat much.   PAST MEDICAL HISTORY:   Past Medical History  Diagnosis Date  . A-fib   . Hypertension   . Benign prostatic hypertrophy   . History of gastrointestinal bleeding   . Hyperlipidemia   . History of multiple myeloma   . CAD (coronary artery disease)   . GERD (gastroesophageal reflux disease)   . CHF (congestive heart failure)   . Peptic ulcer disease   . Hyponatremia   . Multiple myeloma in relapse 10/17/2014  . Cancer     multiple myeloma  . Diabetes mellitus without complication   . 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 Cardiac catheterization    . Coronary angioplasty      [facility]  . Eye surgery      SOCIAL HISTORY:   History  Substance Use Topics  . Smoking status: Never Smoker   . Smokeless tobacco: Not on file  . Alcohol Use: No    FAMILY HISTORY:   Family History  Problem Relation Age of Onset  . Heart disease Father   . Kidney failure Brother     DRUG ALLERGIES:   Allergies  Allergen Reactions  . Aspirin Hives and Other (See Comments)    Reaction:  Causes pt to bleed.   . Celecoxib Other (See Comments)    Reaction:  Causes pt to bleed.     REVIEW OF SYSTEMS:   CONSTITUTIONAL: No fever, Positive for  Fatigue and weakness.  RESPIRATORY: No cough, shortness of breath, wheezing or hemoptysis.  CARDIOVASCULAR:+ chest pain with exertion, orthopnea, edema.  GASTROINTESTINAL: No nausea, vomiting, diarrhea or abdominal pain.  GENITOURINARY: No dysuria, hematuria.  ENDOCRINE: No polyuria, nocturia,  HEMATOLOGY: No anemia, easy bruising or bleeding SKIN: No rash or lesion. MUSCULOSKELETAL: No joint pain or arthritis.   NEUROLOGIC: Positive for tingling and numbness for both legs and both hands since his chemo.  PSYCHIATRY: No anxiety or depression.   MEDICATIONS AT HOME:   Prior to Admission medications   Medication Sig Start Date End Date Taking? Authorizing Provider  atorvastatin (LIPITOR) 40 MG tablet Take 40 mg by mouth at bedtime.   Yes Historical Provider, MD  doxazosin (CARDURA) 2 MG tablet Take 2 mg by mouth at bedtime.   Yes Historical Provider, MD  glipiZIDE (GLUCOTROL XL) 10 MG 24 hr tablet Take 10 mg by mouth 2 (two) times daily.   Yes Historical Provider, MD  magnesium oxide (MAG-OX) 400 MG tablet Take 400 mg by mouth 2 (two) times daily.   Yes Historical Provider, MD  metoprolol tartrate (LOPRESSOR) 25 MG tablet Take 12.5 mg by mouth 2 (two) times daily.  Yes Historical Provider, MD  acetaminophen (TYLENOL) 325 MG tablet Take 650 mg by mouth every 4 (four) hours as needed.    Historical Provider, MD  cetirizine (ZYRTEC) 10 MG tablet Take 10 mg by mouth daily.    Historical Provider, MD  dicyclomine (BENTYL) 20 MG tablet Take 1 tablet (20 mg total) by mouth every 6 (six) hours. As needed for abdominal pain Patient not taking: Reported on 10/18/2014 10/13/14   Boris Lown, DO  dofetilide (TIKOSYN) 500 MCG capsule Take 500 mcg by mouth every 12 (twelve) hours.     Historical Provider, MD  enalapril (VASOTEC) 20 MG tablet Take 20 mg by mouth 2 (two) times daily.     Historical Provider, MD  insulin detemir (LEVEMIR) 100 UNIT/ML injection  Inject 15 Units into the skin at bedtime.    Historical Provider, MD  lidocaine-prilocaine (EMLA) cream Apply 1 application topically once.    Historical Provider, MD  loperamide (IMODIUM A-D) 2 MG tablet Take 1 tablet (2 mg total) by mouth 4 (four) times daily as needed for diarrhea or loose stools. 10/16/14   Sheryl Precious Haws, DO  megestrol (MEGACE) 40 MG/ML suspension Take 40 mg by mouth daily.    Historical Provider, MD  ondansetron (ZOFRAN-ODT) 8 MG disintegrating tablet Take 1 tablet (8 mg total) by mouth every 8 (eight) hours as needed for nausea. Patient not taking: Reported on 10/18/2014 10/13/14   Boris Lown, DO  pantoprazole (PROTONIX) 40 MG tablet Take 40 mg by mouth daily.    Historical Provider, MD  ranitidine (ZANTAC) 150 MG tablet Take 150 mg by mouth 2 (two) times daily.    Historical Provider, MD  warfarin (COUMADIN) 1 MG tablet Take 2 mg by mouth at bedtime.    Historical Provider, MD  warfarin (COUMADIN) 5 MG tablet Take 5 mg by mouth at bedtime.    Historical Provider, MD      VITAL SIGNS:  Blood pressure 169/78, pulse 79, temperature 99.1 F (37.3 C), temperature source Oral, resp. rate 20, height 5' 8"  (1.727 m), weight 76.613 kg (168 lb 14.4 oz), SpO2 99 %.  PHYSICAL EXAMINATION:  GENERAL:  79 y.o.-year-old patient lying in the bed with no acute distress. Thin EYES: Pupils post surgical, equal, round, reactive to light and accommodation. No scleral icterus. Extraocular muscles intact.  HEENT: Head atraumatic, normocephalic. Oropharynx and nasopharynx clear.  NECK:  Supple, no jugular venous distention. No thyroid enlargement, no tenderness.  LUNGS: Normal breath sounds bilaterally, no wheezing, rales,rhonchi or crepitation. No use of accessory muscles of respiration.  CARDIOVASCULAR: S1, S2 normal. 3/6 systolic murmur present, No rubs, or gallops.  ABDOMEN: Soft, nontender, nondistended. Bowel sounds present. No organomegaly or mass.  EXTREMITIES: No pedal edema,  cyanosis, or clubbing.  NEUROLOGIC: Cranial nerves II through XII are intact. Muscle strength 5/5 in all extremities. Sensation intact. Gait not checked. Generalized weakness present PSYCHIATRIC: The patient is alert and oriented x 3.  SKIN: No obvious rash, lesion, or ulcer.   LABORATORY PANEL:   CBC  Recent Labs Lab 10/20/14 0435  WBC 3.4*  HGB 8.5*  HCT 24.0*  PLT 28*   ------------------------------------------------------------------------------------------------------------------  Chemistries   Recent Labs Lab 10/18/14 1107 10/19/14 0436 10/20/14 0435  NA 130* 134* 132*  K 4.7 4.6 4.0  CL 108 113* 114*  CO2 18* 18* 16*  GLUCOSE 161* 133* 142*  BUN 44* 34* 27*  CREATININE 2.77* 2.23* 1.85*  CALCIUM 8.4* 7.6* 7.7*  MG 2.1  --   --  AST 19 17  --   ALT 14* 12*  --   ALKPHOS 53 41  --   BILITOT 0.7 0.8  --    ------------------------------------------------------------------------------------------------------------------  Cardiac Enzymes No results for input(s): TROPONINI in the last 168 hours. ------------------------------------------------------------------------------------------------------------------  RADIOLOGY:  US Renal  10/19/2014   CLINICAL DATA:  Acute renal failure, history atrial fibrillation, hypertension, multiple myeloma, coronary artery disease, CHF, diabetes  EXAM: RENAL / URINARY TRACT ULTRASOUND COMPLETE  COMPARISON:  CT abdomen and pelvis 10/13/2014  FINDINGS: Right Kidney:  Length: 10.7 cm. Normal cortical thickness. Upper normal cortical echogenicity. No mass, hydronephrosis or shadowing calcification. No perinephric fluid.  Left Kidney:  Length: 10.6 cm. Normal cortical thickness with upper normal cortical echogenicity. No mass, hydronephrosis or shadowing calcification. No perinephric fluid.  Bladder:  Normal appearance.  IMPRESSION: No renal sonographic abnormalities identified.   Electronically Signed   By: Lavonia Dana M.D.   On:  10/19/2014 15:43    EKG:   Orders placed or performed during the hospital encounter of 10/18/14  . EKG 12-Lead  . EKG 12-Lead    IMPRESSION AND PLAN:   78y/oM with multiple myeloma on chemo, HTN, DM, CAD s/p PCI, Afib on coumadin, BPH, GERD admitted for generalised weakness and noted to have acute on chronic renal failure and anemia. Medical consult requested for Medical management  1. Acute anemia- secondary to multiple myeloma progressing likely. hgb improved since transfusion, now up to 8.4 Mgmt per oncology, Transfuse for hb <7 Hold coumadin- likely not a good candidate for warfarin with significant anemia  2. Acute on chronic renal failure- baseline cr of 2.0, at 2.7 on adm: improved and back to baseline Prerenal likely and also multiple myeloma Decrease IVF Nephrology consulted Hold ACEI  3. Throbocytopenia- stable chronic, monitor, Tx plts if plt count <20k  4. CAD s/p PCI- stable, does have CP with exertion cardiology following, on  metoprolol and statin. No asa due to anemia and thrombocytopenia  5. DM- controlled Hold glipizide while inpatient Continue ssi  6. Multiple Myeloma- Progressive multiple myeloma, on third line therapy. Overall poor prognosis. Mgmt per oncology team Agree with palliative care consult  7. Afib- Tikosyn held due to renal failure and prolonged QT, Adjust elecrtolytes Rate controlled with metoprolol, coumadin held as well due to anemia  8. DVT prophylaxis- TEDs, SCDs     All the records are reviewed and case discussed with Consulting provider. Management plans discussed with the patient, family and they are in agreement.  CODE STATUS: Full Code  TOTAL TIME TAKING CARE OF THIS PATIENT: 25 minutes.    Myrtis Ser M.D on 10/20/2014 at 11:57 AM  Between 7am to 6pm - Pager - [phone]  After 6pm go to [email or web address] - password [email or web address] [facility]  Office  [phone]  CC: Primary care Physician:  Petra Kuba, MD

## 2014-10-20 NOTE — Progress Notes (Signed)
[facility]  ROUNDING NOTE   Subjective:   Family at bedside. Patient eating more. Feeling better.  NS changed to 48m/hr  Objective:  Vital signs in last 24 hours:  Temp:  [98.5 F (36.9 C)-100.3 F (37.9 C)] 99.1 F (37.3 C) (05/08 0501) Pulse Rate:  [75-88] 79 (05/08 0501) Resp:  [18-20] 20 (05/08 0501) BP: (151-198)/(72-98) 169/78 mmHg (05/08 0501) SpO2:  [99 %-100 %] 99 % (05/08 0501) Weight:  [76.613 kg (168 lb 14.4 oz)] 76.613 kg (168 lb 14.4 oz) (05/08 0501)  Weight change: 4.491 kg (9 lb 14.4 oz) Filed Weights   10/18/14 1559 10/20/14 0501  Weight: 72.122 kg (159 lb) 76.613 kg (168 lb 14.4 oz)    Intake/Output: I/O last 3 completed shifts: In: 5033 [P.O.:240; I.V.:4169; Blood:624] Out: 2476 [Urine:2475; Stool:1]   Intake/Output this shift:  Total I/O In: -  Out: 1350 [Urine:1350]  Physical Exam: General: NAD,   Head: Normocephalic, atraumatic. Moist oral mucosal membranes  Eyes: Anicteric, PERRL  Neck: Supple, trachea midline  Lungs:  Clear to auscultation  Heart: Regular rate and rhythm  Abdomen:  Soft, nontender,   Extremities:  peripheral edema.  Neurologic: Nonfocal, moving all four extremities  Skin: No lesions       Basic Metabolic Panel:  Recent Labs Lab 10/13/14 1900 10/18/14 1107 10/19/14 0436 10/20/14 0435  NA 128* 130* 134* 132*  K 5.6* 4.7 4.6 4.0  CL 104 108 113* 114*  CO2 20* 18* 18* 16*  GLUCOSE 227* 161* 133* 142*  BUN 49* 44* 34* 27*  CREATININE 2.33* 2.77* 2.23* 1.85*  CALCIUM 8.3* 8.4* 7.6* 7.7*  MG  --  2.1  --   --   PHOS  --   --   --  1.6*    Liver Function Tests:  Recent Labs Lab 10/13/14 1900 10/18/14 1107 10/19/14 0436  AST 18 19 17   ALT 16* 14* 12*  ALKPHOS 48 53 41  BILITOT 0.7 0.7 0.8  PROT 10.8* >12.0* 10.5*  ALBUMIN 2.7* 3.1* 2.6*    Recent Labs Lab 10/13/14 1900  LIPASE 40   No results for input(s): AMMONIA in the last 168 hours.  CBC:  Recent Labs Lab 10/15/14 1451  10/18/14 1107 10/19/14 0436 10/20/14 0435  WBC 3.4* 3.7* 2.4* 3.4*  NEUTROABS  --  1.5  --  1.8  HGB 6.7* 7.4* 6.2* 8.5*  HCT 19.5* 21.7* 18.4* 24.0*  MCV 91.3 91.5 91.6 88.3  PLT 23* 32* 26* 28*    Cardiac Enzymes: No results for input(s): CKTOTAL, CKMB, CKMBINDEX, TROPONINI in the last 168 hours.  BNP: Invalid input(s): POCBNP  CBG:  Recent Labs Lab 10/19/14 0732 10/19/14 1107 10/19/14 1642 10/20/14 0750 10/20/14 1138  GLUCAP 101* 192* 209* 87 217*    Microbiology: Results for orders placed or performed during the hospital encounter of 09/18/14  Stool culture     Status: None   Collection Time: 09/18/14 11:07 PM  Result Value Ref Range Status   Micro Text Report   Final       COMMENT                   NO SALMONELLA OR SHIGELLA ISOLATED   COMMENT                   NO PATHOGENIC E.COLI DETECTED   COMMENT                   NO CAMPYLOBACTER ANTIGEN DETECTED  ANTIBIOTIC                                                      Clostridium Difficile [facility])     Status: None   Collection Time: 09/19/14  4:06 AM  Result Value Ref Range Status   Micro Text Report   Final       C.DIFFICILE ANTIGEN       C.DIFFICILE GDH ANTIGEN : NEGATIVE   C.DIFFICILE TOXIN A/B     C.DIFFICILE TOXINS A AND B : NEGATIVE   INTERPRETATION            Negative for C. difficile.    ANTIBIOTIC                                                        Coagulation Studies:  Recent Labs  10/18/14 1849 10/19/14 0436 10/20/14 0435  LABPROT 19.1* 18.9* 21.5*  INR 1.59 1.56 1.85    Urinalysis: No results for input(s): COLORURINE, LABSPEC, PHURINE, GLUCOSEU, HGBUR, BILIRUBINUR, KETONESUR, PROTEINUR, UROBILINOGEN, NITRITE, LEUKOCYTESUR in the last 72 hours.  Invalid input(s): APPERANCEUR    Imaging: US Renal  10/19/2014   CLINICAL DATA:  Acute renal failure, history atrial fibrillation, hypertension, multiple myeloma, coronary artery disease, CHF, diabetes  EXAM: RENAL / URINARY TRACT  ULTRASOUND COMPLETE  COMPARISON:  CT abdomen and pelvis 10/13/2014  FINDINGS: Right Kidney:  Length: 10.7 cm. Normal cortical thickness. Upper normal cortical echogenicity. No mass, hydronephrosis or shadowing calcification. No perinephric fluid.  Left Kidney:  Length: 10.6 cm. Normal cortical thickness with upper normal cortical echogenicity. No mass, hydronephrosis or shadowing calcification. No perinephric fluid.  Bladder:  Normal appearance.  IMPRESSION: No renal sonographic abnormalities identified.   Electronically Signed   By: Lavonia Dana M.D.   On: 10/19/2014 15:43     Medications:   . sodium chloride 50 mL/hr at 10/20/14 1257   . amLODipine  10 mg Oral Daily  . atorvastatin  40 mg Oral QHS  . [START ON 10/21/2014] calcium-vitamin D  1 tablet Oral Q breakfast  . doxazosin  2 mg Oral QHS  . feeding supplement (GLUCERNA SHAKE)  237 mL Oral BID WC  . insulin aspart  0-9 Units Subcutaneous TID WC  . lidocaine-prilocaine  1 application Topical Once  . loratadine  10 mg Oral Daily  . megestrol  40 mg Oral Daily  . metoprolol tartrate  12.5 mg Oral BID  . pantoprazole  40 mg Oral QAC breakfast   acetaminophen, alum & mag hydroxide-simeth, [DISCONTINUED] ondansetron **OR** [DISCONTINUED] ondansetron **OR** ondansetron (ZOFRAN) IV **OR** [DISCONTINUED] ondansetron (ZOFRAN) IV, oxyCODONE, senna-docusate  Assessment/ Plan:  Peter Walters is a 79 y.o.black male with multiple myeloma, atrial fibrillation, hypertension, hyperlipidmia, coronary artery disease status post stent, RIJ port, anemia, history of peptic ulcer disease, GERD, diabetes mellitus type II, BPH and allergic rhinitis.   Patient was admitted to [facility] on 10/18/2014 with progressive weakness.   1. Acute renal failure on chronic kidney disease stage III with hyponatremia, hyperkalemia and proteinuria: N17.9, N18.3, E87.1, E87.5, R80.9 Chronic Kidney Disease is secondary to multiple myeloma. Creatinine improving.  - History is consistent  with prerenal  azotemia. Agree with IV fluids. Will decrease to NS at 38m/hr.  - now with metabolic acidosis, will monitor.  - holding enalapril due to acute renal failure and hyperkalemia  2. Acute pancytopenia: leukocytopenia, anemia and thrombocytopenia - PRBC transfusion 5/7 - Appreciate heme/onc input.   3. Hypertension: well controlled on admission. Holding enalapril - metorpolol, doxazosin  4. Diabetes Mellitus type II with renal manifestations: with proteinuria - on glipizide.    LOS: 2 Peter Walters 5/8/20161:55 PM

## 2014-10-20 NOTE — Progress Notes (Signed)
Dr. Alcus Dad notified of patients elevated BP, scheduled dose of metroprolol given tonight with no improvement noted. Acknowledged, with no new orders at the present and a request to page Cardiology.

## 2014-10-20 NOTE — Progress Notes (Signed)
Dr. Estrella Deeds notified of patients elevated BP. New order for Amlodipine 5mg  PO once now. If BP continues to be elevated dose may be repeated in 2-3 hours.

## 2014-10-20 NOTE — Progress Notes (Signed)
[facility] Hematology/Oncology Progress Note  Date of admission: 10/18/2014  Hospital day:  10/20/2014  Chief Complaint: Peter Walters is a 79 y.o. male who was admitted on 10/18/2014 with progressive weakness, renal insufficiency, and pancytopenia.  Subjective: The patient has done well overnight.  He is eating better, although his wife notes that he does not eat much in the mornings.  He is voiding well.  He denies any bruising or bleeding.  He tolerated his blood transfusion yesterday.  Blood pressure went up last night, but responded well to amlodipine.  Social History: The patient is accompanied by his wife  Allergies:  Allergies  Allergen Reactions  . Aspirin Hives and Other (See Comments)    Reaction:  Causes pt to bleed.   . Celecoxib Other (See Comments)    Reaction:  Causes pt to bleed.     Medications Prior to Admission  Medication Sig Dispense Refill  . atorvastatin (LIPITOR) 40 MG tablet Take 40 mg by mouth at bedtime.    Marland Kitchen doxazosin (CARDURA) 2 MG tablet Take 2 mg by mouth at bedtime.    Marland Kitchen glipiZIDE (GLUCOTROL XL) 10 MG 24 hr tablet Take 10 mg by mouth 2 (two) times daily.    . magnesium oxide (MAG-OX) 400 MG tablet Take 400 mg by mouth 2 (two) times daily.    . metoprolol tartrate (LOPRESSOR) 25 MG tablet Take 12.5 mg by mouth 2 (two) times daily.     Marland Kitchen acetaminophen (TYLENOL) 325 MG tablet Take 650 mg by mouth every 4 (four) hours as needed.    . cetirizine (ZYRTEC) 10 MG tablet Take 10 mg by mouth daily.    Marland Kitchen dicyclomine (BENTYL) 20 MG tablet Take 1 tablet (20 mg total) by mouth every 6 (six) hours. As needed for abdominal pain (Patient not taking: Reported on 10/18/2014) 20 tablet 0  . dofetilide (TIKOSYN) 500 MCG capsule Take 500 mcg by mouth every 12 (twelve) hours.     . enalapril (VASOTEC) 20 MG tablet Take 20 mg by mouth 2 (two) times daily.     . insulin detemir (LEVEMIR) 100 UNIT/ML injection Inject 15 Units into the skin at bedtime.    .  lidocaine-prilocaine (EMLA) cream Apply 1 application topically once.    . loperamide (IMODIUM A-D) 2 MG tablet Take 1 tablet (2 mg total) by mouth 4 (four) times daily as needed for diarrhea or loose stools. 30 tablet 0  . megestrol (MEGACE) 40 MG/ML suspension Take 40 mg by mouth daily.    . ondansetron (ZOFRAN-ODT) 8 MG disintegrating tablet Take 1 tablet (8 mg total) by mouth every 8 (eight) hours as needed for nausea. (Patient not taking: Reported on 10/18/2014) 30 tablet 0  . pantoprazole (PROTONIX) 40 MG tablet Take 40 mg by mouth daily.    . ranitidine (ZANTAC) 150 MG tablet Take 150 mg by mouth 2 (two) times daily.    Marland Kitchen warfarin (COUMADIN) 1 MG tablet Take 2 mg by mouth at bedtime.    Marland Kitchen warfarin (COUMADIN) 5 MG tablet Take 5 mg by mouth at bedtime.      Review of Systems: GENERAL:  Feels better.  Working with PT.  No fevers, sweats or weight loss. PERFORMANCE STATUS (ECOG):  2 HEENT:  No visual changes, runny nose, sore throat, mouth sores or tenderness. Lungs: No shortness of breath or cough.  No hemoptysis. Cardiac:  No chest pain, palpitations, orthopnea, or PND. GI:  No nausea, vomiting, diarrhea, constipation, melena or hematochezia.  GU:  Urinary frequency with IVF.  No dysuria, or hematuria. Musculoskeletal:  No back pain.  No joint pain.  No muscle tenderness. Extremities:  No pain or swelling. Skin:  No rashes or skin changes. Neuro:  No headache, numbness or weakness, balance or coordination issues. Endocrine:  Diabetes.  No thyroid issues, hot flashes or night sweats. Psych:  No mood changes, depression or anxiety. Pain:  No focal pain. Review of systems:  All other systems reviewed and found to be negative.  Physical Exam: Blood pressure 169/78, pulse 79, temperature 99.1 F (37.3 C), temperature source Oral, resp. rate 20, height _0  (1.727 m), weight 168 lb 14.4 oz (76.613 kg), SpO2 99 %.  GENERAL: Thin elderly gentleman sitting comfortably in bed on the medical  unit. He was initially sleeping as he was up often last night secondary to urinary frequency. MENTAL STATUS: Alert and oriented to person, place and time. HEAD: Short graying hair. Mustache. Temporal wasting. Normocephalic, atraumatic, face symmetric, no Cushingoid features. EYES: Brown eyes. Right pupil reactive to light. Keeps left eye closed secondary to sensitivity to light (chronic). No conjunctivitis or scleral icterus. ENT: Oropharynx clear without lesion. Tongue normal. Mucous membranes moist.  RESPIRATORY: Clear to auscultation without rales, wheezes or rhonchi. CARDIOVASCULAR: Regular rate and rhythm without murmur, rub or gallop. ABDOMEN: Soft, non-tender, with active bowel sounds, and no hepatosplenomegaly. No masses. SKIN: No rashes, ulcers or lesions. EXTREMITIES: No edema, no skin discoloration or tenderness. No palpable cords. NEUROLOGICAL: Unremarkable. PSYCH: Appropriate  Results for orders placed or performed during the hospital encounter of 10/18/14 (from the past 48 hour(s))  Glucose, capillary     Status: Abnormal   Collection Time: 10/18/14  5:01 PM  Result Value Ref Range   Glucose-Capillary 219 (H) 70 - 99 mg/dL  Protime-INR     Status: Abnormal   Collection Time: 10/18/14  6:49 PM  Result Value Ref Range   Prothrombin Time 19.1 (H) 11.4 - 15.0 seconds   INR 1.59   Glucose, capillary     Status: Abnormal   Collection Time: 10/18/14 10:13 PM  Result Value Ref Range   Glucose-Capillary 218 (H) 70 - 99 mg/dL  Comprehensive metabolic panel     Status: Abnormal   Collection Time: 10/19/14  4:36 AM  Result Value Ref Range   Sodium 134 (L) 135 - 145 mmol/L   Potassium 4.6 3.5 - 5.1 mmol/L   Chloride 113 (H) 101 - 111 mmol/L   CO2 18 (L) 22 - 32 mmol/L   Glucose, Bld 133 (H) 65 - 99 mg/dL   BUN 34 (H) 6 - 20 mg/dL   Creatinine, Ser 2.23 (H) 0.61 - 1.24 mg/dL   Calcium 7.6 (L) 8.9 - 10.3 mg/dL   Total Protein 10.5 (H) 6.5 - 8.1 g/dL   Albumin  2.6 (L) 3.5 - 5.0 g/dL   AST 17 15 - 41 U/L   ALT 12 (L) 17 - 63 U/L   Alkaline Phosphatase 41 38 - 126 U/L   Total Bilirubin 0.8 0.3 - 1.2 mg/dL   GFR calc non Af Amer 27 (L) >60 mL/min   GFR calc Af Amer 31 (L) >60 mL/min    Comment: (NOTE) The eGFR has been calculated using the CKD EPI equation. This calculation has not been validated in all clinical situations. eGFR's persistently <60 mL/min signify possible Chronic Kidney Disease.    Anion gap 3 (L) 5 - 15  CBC     Status: Abnormal  Collection Time: 10/19/14  4:36 AM  Result Value Ref Range   WBC 2.4 (L) 3.8 - 10.6 K/uL   RBC 2.01 (L) 4.40 - 5.90 MIL/uL   Hemoglobin 6.2 (L) 13.0 - 18.0 g/dL   HCT 18.4 (L) 40.0 - 52.0 %   MCV 91.6 80.0 - 100.0 fL   MCH 30.9 26.0 - 34.0 pg   MCHC 33.8 32.0 - 36.0 g/dL   RDW 19.2 (H) 11.5 - 14.5 %   Platelets 26 (LL) 150 - 440 K/uL    Comment: CRITICAL CALLED TO HANNAH CAHOON AT [phone] ON 10/19/14 BY RWW.Marland KitchenMarland KitchenFf Thompson Hospital PLATELET COUNT CONFIRMED BY SMEAR   Protime-INR     Status: Abnormal   Collection Time: 10/19/14  4:36 AM  Result Value Ref Range   Prothrombin Time 18.9 (H) 11.4 - 15.0 seconds   INR 1.56   Type and screen     Status: None (Preliminary result)   Collection Time: 10/19/14  4:38 AM  Result Value Ref Range   ABO/RH(D) A POS    Antibody Screen NEG    Sample Expiration 10/22/2014    Unit Number [id number]    Blood Component Type RED CELLS,LR    Unit division 00    Status of Unit ISSUED    Transfusion Status OK TO TRANSFUSE    Crossmatch Result COMPATIBLE    Unit Number [id number]    Blood Component Type RBC, LR IRR    Unit division 00    Status of Unit ISSUED    Transfusion Status OK TO TRANSFUSE    Crossmatch Result COMPATIBLE   ABO/Rh     Status: None   Collection Time: 10/19/14  4:40 AM  Result Value Ref Range   ABO/RH(D) A POS   Glucose, capillary     Status: Abnormal   Collection Time: 10/19/14  7:32 AM  Result Value Ref Range   Glucose-Capillary 101 (H) 70 - 99  mg/dL  Prepare RBC     Status: None   Collection Time: 10/19/14 10:29 AM  Result Value Ref Range   Order Confirmation ORDER PROCESSED BY BLOOD BANK   Glucose, capillary     Status: Abnormal   Collection Time: 10/19/14 11:07 AM  Result Value Ref Range   Glucose-Capillary 192 (H) 70 - 99 mg/dL  Glucose, capillary     Status: Abnormal   Collection Time: 10/19/14  4:42 PM  Result Value Ref Range   Glucose-Capillary 209 (H) 70 - 99 mg/dL  CBC with Differential     Status: Abnormal   Collection Time: 10/20/14  4:35 AM  Result Value Ref Range   WBC 3.4 (L) 3.8 - 10.6 K/uL    Comment: CORRECTED ON 05/08 AT 0805: PREVIOUSLY REPORTED AS 3.3   RBC 2.72 (L) 4.40 - 5.90 MIL/uL    Comment: CORRECTED ON 05/08 AT 0805: PREVIOUSLY REPORTED AS 2.69   Hemoglobin 8.5 (L) 13.0 - 18.0 g/dL    Comment: CORRECTED ON 05/08 AT 0805: PREVIOUSLY REPORTED AS 8.3   HCT 24.0 (L) 40.0 - 52.0 %   MCV 88.3 80.0 - 100.0 fL    Comment: CORRECTED ON 05/08 AT 0805: PREVIOUSLY REPORTED AS 88.9   MCH 31.1 26.0 - 34.0 pg    Comment: CORRECTED ON 05/08 AT 0805: PREVIOUSLY REPORTED AS 30.8   MCHC 35.2 32.0 - 36.0 g/dL    Comment: CORRECTED ON 05/08 AT 0805: PREVIOUSLY REPORTED AS 34.7   RDW 18.8 (H) 11.5 - 14.5 %    Comment: CORRECTED ON 05/08  AT 0805: PREVIOUSLY REPORTED AS 18.4   Platelets 28 (LL) 150 - 440 K/uL    Comment: CORRECTED ON 05/08 AT 0805: PREVIOUSLY REPORTED AS 28 PLATELET COUNT CONFIRMED BY SMEAR CRITICAL VALUE NOTED.  VALUE IS CONSISTENT WITH PREVIOUSLY REPORTED AND CALLED VALUE.   Neutrophils Relative % 53 %   Lymphocytes Relative 31 %   Monocytes Relative 10 %   Eosinophils Relative 5 %   Basophils Relative 1 %   Neutro Abs 1.8 1.4 - 6.5 K/uL   Lymphs Abs 1.1 1.0 - 3.6 K/uL   Monocytes Absolute 0.3 0.2 - 1.0 K/uL   Eosinophils Absolute 0.2 0 - 0.7 K/uL   Basophils Absolute 0.0 0 - 0.1 K/uL  Basic metabolic panel     Status: Abnormal   Collection Time: 10/20/14  4:35 AM  Result Value Ref Range    Sodium 132 (L) 135 - 145 mmol/L   Potassium 4.0 3.5 - 5.1 mmol/L   Chloride 114 (H) 101 - 111 mmol/L   CO2 16 (L) 22 - 32 mmol/L   Glucose, Bld 142 (H) 65 - 99 mg/dL   BUN 27 (H) 6 - 20 mg/dL   Creatinine, Ser 1.85 (H) 0.61 - 1.24 mg/dL   Calcium 7.7 (L) 8.9 - 10.3 mg/dL   GFR calc non Af Amer 34 (L) >60 mL/min   GFR calc Af Amer 39 (L) >60 mL/min    Comment: (NOTE) The eGFR has been calculated using the CKD EPI equation. This calculation has not been validated in all clinical situations. eGFR's persistently <60 mL/min signify possible Chronic Kidney Disease.    Anion gap 2 (L) 5 - 15  Protime-INR     Status: Abnormal   Collection Time: 10/20/14  4:35 AM  Result Value Ref Range   Prothrombin Time 21.5 (H) 11.4 - 15.0 seconds   INR 1.85   Phosphorus     Status: Abnormal   Collection Time: 10/20/14  4:35 AM  Result Value Ref Range   Phosphorus 1.6 (L) 2.5 - 4.6 mg/dL  Glucose, capillary     Status: None   Collection Time: 10/20/14  7:50 AM  Result Value Ref Range   Glucose-Capillary 87 70 - 99 mg/dL   Comment 1 Notify RN   Glucose, capillary     Status: Abnormal   Collection Time: 10/20/14 11:38 AM  Result Value Ref Range   Glucose-Capillary 217 (H) 70 - 99 mg/dL   Comment 1 Notify RN    US Renal  10/19/2014   CLINICAL DATA:  Acute renal failure, history atrial fibrillation, hypertension, multiple myeloma, coronary artery disease, CHF, diabetes  EXAM: RENAL / URINARY TRACT ULTRASOUND COMPLETE  COMPARISON:  CT abdomen and pelvis 10/13/2014  FINDINGS: Right Kidney:  Length: 10.7 cm. Normal cortical thickness. Upper normal cortical echogenicity. No mass, hydronephrosis or shadowing calcification. No perinephric fluid.  Left Kidney:  Length: 10.6 cm. Normal cortical thickness with upper normal cortical echogenicity. No mass, hydronephrosis or shadowing calcification. No perinephric fluid.  Bladder:  Normal appearance.  IMPRESSION: No renal sonographic abnormalities identified.    Electronically Signed   By: Lavonia Dana M.D.   On: 10/19/2014 15:43    Assessment:  Peter Walters is a 79 y.o. male with progressive stage III multiple myeloma on third line therapy. 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 He was admitted on 10/18/2014 with progressive weakness likely due to dehydration and pancytopenia secondary to progressive marrow involvement. His renal function has declined secondary to dehydration and  an increasing M spike.  Plan: 1. Hematology/Oncology- patient received 2 units PRBC yesterday.  Tolerated well.  Hematocrit up.  Platelets remain adequate.  INR 1.8.  Coumadin stopped yesterday.  Should begin to see INR drift down. Platelets adequate (> 20,000). No bleeding. Dr Grayland Ormond to follow-up with patient tomorrow regarding any future treatment plans. 2. Fluids/Electrolytes/Nutrition- Renal ultrasound without evidence of hydronephrosis.  Creatinine back to baseline.  Corrected with IVF.  Will decrease IVF and follow.  Calcium slightly low (7.76; corrected 8.26).  Begin oral calcium.Patient eating more.  Tolerating Glucerna shakes. 3. Cardiology- QT elevated. Tikosyn discontinued. Patient on central telemetry. Echocardiogram pending.  Hypertension last night after blood transfusion.  Enalapril given.  Appreciate cardiology consult. 4. Disposition- Ongoing improvement. Appreciate hospitalist assistance.  Possible discharge in AM.  Lequita Asal, MD  10/20/2014, 12:30 PM

## 2014-10-20 NOTE — Progress Notes (Addendum)
TC from telemetry reporting pt had short run of PAT's(run a.fib); now sinus arrthymia with PAC's. Pt asymptomatic. TC to Dr. Nehemiah Massed with telemetry and rhythm change report, reviewed current meds. New orders obtained.Will continue to assess.

## 2014-10-20 NOTE — Plan of Care (Signed)
Problem: Discharge Progression Outcomes Goal: Discharge plan in place and appropriate individualization  Address patient as Peter Walters.    Lives at home with wife. History of multiple myeloma,DM,HTN, controlled with home medications.        Goal: Other Discharge Outcomes/Goals Plan of Care Progressing to Goal: Acute Renal Failure- IVF continue per order. 2nd unit of pRBC's infused with no signs or symptoms of transfusion reaction. Elevated BP, received order for Amlodipine 84m PO once now. Pt up to BR with assistance d/t history of falls, voids using urinal. No c/o pain this shift. Tolerating diet. Off unit telemetry reports NSR, HR 80's. Wife at bedside. High fall risk, exit alarm on and pt instructed to call for assistance.

## 2014-10-21 ENCOUNTER — Other Ambulatory Visit: Payer: Self-pay

## 2014-10-21 ENCOUNTER — Ambulatory Visit: Payer: Self-pay

## 2014-10-21 ENCOUNTER — Encounter: Payer: Self-pay | Admitting: Oncology

## 2014-10-21 DIAGNOSIS — R944 Abnormal results of kidney function studies: Secondary | ICD-10-CM

## 2014-10-21 DIAGNOSIS — D649 Anemia, unspecified: Secondary | ICD-10-CM

## 2014-10-21 DIAGNOSIS — N289 Disorder of kidney and ureter, unspecified: Secondary | ICD-10-CM

## 2014-10-21 DIAGNOSIS — D696 Thrombocytopenia, unspecified: Secondary | ICD-10-CM

## 2014-10-21 DIAGNOSIS — Z515 Encounter for palliative care: Secondary | ICD-10-CM

## 2014-10-21 DIAGNOSIS — Z794 Long term (current) use of insulin: Secondary | ICD-10-CM

## 2014-10-21 LAB — PROTEIN ELECTROPHORESIS, SERUM
A/G Ratio: 0.5 — ABNORMAL LOW (ref 0.7–2.0)
ALPHA-1-GLOBULIN: 0.3 g/dL (ref 0.1–0.4)
ALPHA-2-GLOBULIN: 1.5 g/dL — AB (ref 0.4–1.2)
Albumin ELP: 3.8 g/dL (ref 3.2–5.6)
Beta Globulin: 1.1 g/dL (ref 0.6–1.3)
Gamma Globulin: 4.8 g/dL — ABNORMAL HIGH (ref 0.5–1.6)
Globulin, Total: 7.7 g/dL — ABNORMAL HIGH (ref 2.0–4.5)
M-SPIKE, %: 4.7 g/dL — AB
Total Protein ELP: 11.5 g/dL — ABNORMAL HIGH (ref 6.0–8.5)

## 2014-10-21 LAB — CBC
HEMATOCRIT: 24.5 % — AB (ref 40.0–52.0)
HEMOGLOBIN: 8.5 g/dL — AB (ref 13.0–18.0)
MCH: 30.6 pg (ref 26.0–34.0)
MCHC: 34.7 g/dL (ref 32.0–36.0)
MCV: 88 fL (ref 80.0–100.0)
Platelets: 27 10*3/uL — CL (ref 150–440)
RBC: 2.78 MIL/uL — ABNORMAL LOW (ref 4.40–5.90)
RDW: 18.8 % — AB (ref 11.5–14.5)
WBC: 3.7 10*3/uL — ABNORMAL LOW (ref 3.8–10.6)

## 2014-10-21 LAB — COMPREHENSIVE METABOLIC PANEL
ALT: 12 U/L — ABNORMAL LOW (ref 17–63)
AST: 17 U/L (ref 15–41)
Albumin: 2.6 g/dL — ABNORMAL LOW (ref 3.5–5.0)
Alkaline Phosphatase: 45 U/L (ref 38–126)
Anion gap: 5 (ref 5–15)
BUN: 21 mg/dL — ABNORMAL HIGH (ref 6–20)
CO2: 17 mmol/L — ABNORMAL LOW (ref 22–32)
Calcium: 8 mg/dL — ABNORMAL LOW (ref 8.9–10.3)
Chloride: 113 mmol/L — ABNORMAL HIGH (ref 101–111)
Creatinine, Ser: 1.68 mg/dL — ABNORMAL HIGH (ref 0.61–1.24)
GFR calc Af Amer: 44 mL/min — ABNORMAL LOW (ref >60)
GFR calc non Af Amer: 38 mL/min — ABNORMAL LOW (ref >60)
Glucose, Bld: 118 mg/dL — ABNORMAL HIGH (ref 65–99)
Potassium: 3.9 mmol/L (ref 3.5–5.1)
Sodium: 135 mmol/L (ref 135–145)
Total Bilirubin: 1 mg/dL (ref 0.3–1.2)
Total Protein: 10.6 g/dL — ABNORMAL HIGH (ref 6.5–8.1)

## 2014-10-21 LAB — GLUCOSE, CAPILLARY
GLUCOSE-CAPILLARY: 113 mg/dL — AB (ref 70–99)
Glucose-Capillary: 215 mg/dL — ABNORMAL HIGH (ref 70–99)
Glucose-Capillary: 197 mg/dL — ABNORMAL HIGH (ref 70–99)
Glucose-Capillary: 264 mg/dL — ABNORMAL HIGH (ref 70–99)

## 2014-10-21 LAB — PARATHYROID HORMONE, INTACT (NO CA): PTH: 21 pg/mL (ref 15–65)

## 2014-10-21 LAB — PROTIME-INR
INR: 1.65
Prothrombin Time: 19.7 seconds — ABNORMAL HIGH (ref 11.4–15.0)

## 2014-10-21 MED ORDER — DILTIAZEM HCL 25 MG/5ML IV SOLN
10.0000 mg | Freq: Once | INTRAVENOUS | Status: DC
  Administered 2014-10-21: 50 mg via INTRAVENOUS

## 2014-10-21 MED ORDER — METOPROLOL TARTRATE 25 MG PO TABS
25.0000 mg | ORAL_TABLET | Freq: Four times a day (QID) | ORAL | Status: DC
  Administered 2014-10-21 – 2014-10-24 (×11): 25 mg via ORAL
  Filled 2014-10-21 (×12): qty 1

## 2014-10-21 MED ORDER — DILTIAZEM HCL 25 MG/5ML IV SOLN
10.0000 mg | Freq: Once | INTRAVENOUS | Status: DC
  Filled 2014-10-21 (×2): qty 5

## 2014-10-21 MED ORDER — DILTIAZEM HCL 60 MG PO TABS
60.0000 mg | ORAL_TABLET | Freq: Three times a day (TID) | ORAL | Status: DC
  Administered 2014-10-21 – 2014-10-22 (×2): 60 mg via ORAL
  Filled 2014-10-21 (×2): qty 1

## 2014-10-21 MED ORDER — DILTIAZEM HCL 25 MG/5ML IV SOLN
10.0000 mg | Freq: Once | INTRAVENOUS | Status: AC
  Administered 2014-10-21: 10 mg via INTRAVENOUS
  Filled 2014-10-21: qty 5

## 2014-10-21 MED ORDER — DILTIAZEM HCL 25 MG/5ML IV SOLN
INTRAVENOUS | Status: AC
  Administered 2014-10-21: 50 mg via INTRAVENOUS
  Filled 2014-10-21: qty 5

## 2014-10-21 NOTE — Plan of Care (Signed)
Problem: Discharge Progression Outcomes Goal: Discharge plan in place and appropriate individualization  Address patient as Peter Walters.     Lives at home with wife. History of multiple myeloma,DM,HTN, controlled with home medications.             Goal: Other Discharge Outcomes/Goals Plan of Care Progressing to Goal: Acute Renal Failure- IVF continue per order. Glucose 148, WNL. Pt up to BR with assistance d/t history of falls, voids using urinal. No c/o pain this shift. Tolerating diet. Off unit telemetry reports NSR, HR 80's. Wife at bedside. High fall risk, exit alarm on and pt instructed to call for assistance.

## 2014-10-21 NOTE — Care Management (Signed)
Admitted to [facility] with the diagnosis of multiple myeloma. Lives with wife, Manuela Schwartz, [phone]). Sees Dr. Leona Singleton. Last seen Tuesday of last week. No home health. No skilled facility. No home oxygen. Started using a cane for the last 2 weeks per wife. Takes care of all activities of daily living himself. Family will transport. Physical therapy evaluation completed. Recommends home health with supervision. Wife is in the home. [place].  Pallative and Nephrology consult. Shelbie Ammons RN MSN Care Management [phone]

## 2014-10-21 NOTE — Progress Notes (Signed)
2145 Pt noted with afib HR 180, pt diaphoretic but otherwise assymptomatic, BP 170/98 HR 135, Dr. Josefa Half notified, orders received, supervisor in to assist, Dr. Lavetta Nielsen ordered Cardiazem IV and pushed by supervisor.  Will monitor

## 2014-10-21 NOTE — Progress Notes (Signed)
Called by nursing staff for patient A. fib rapid ventricular response, heart rate 628Z, systolic blood pressure 662 Patient of evaluated bedside, complains of palpitations We will order IV Cardizem 10 mg bolus Follow heart rate If requires continued Cardizem bolus was transferred 2 a, if fails multiple when necessary doses will transfer to stepdown for Cardizem drip

## 2014-10-21 NOTE — Consult Note (Signed)
[facility] [place] follow up   PATIENT NAME: Peter Walters    MR#:  [id number]  DATE OF BIRTH:  [birth year]  DATE OF ADMISSION:  10/18/2014  PRIMARY CARE PHYSICIAN: Petra Kuba, MD   REQUESTING/REFERRING PHYSICIAN: Nolon Stalls, M.D  CHIEF COMPLAINT:  Peter Walters  is a 79 y.o. male with a known history of Recurring Multiple myeloma on chemo currently, Afib on coumadin, HTN, BPH, CAD s/p PCI, DM, GERD admitted for generalised weakness for 2-3 weeks now but worse to the point that he was not able to even get up out of bed prior to admission.  Feeling better today. Ate well. Energy level improving.   PAST MEDICAL HISTORY:   Past Medical History  Diagnosis Date  . A-fib   . Hypertension   . Benign prostatic hypertrophy   . History of gastrointestinal bleeding   . Hyperlipidemia   . History of multiple myeloma   . CAD (coronary artery disease)   . GERD (gastroesophageal reflux disease)   . CHF (congestive heart failure)   . Peptic ulcer disease   . Hyponatremia   . Multiple myeloma in relapse 10/17/2014  . Cancer     multiple myeloma  . Diabetes mellitus without complication   . 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 Cardiac catheterization    . Coronary angioplasty      [facility]  . Eye surgery      SOCIAL HISTORY:   History  Substance Use Topics  . Smoking status: Never Smoker   . Smokeless tobacco: Not on file  . Alcohol Use: No    FAMILY HISTORY:   Family History  Problem Relation Age of Onset  . Heart disease Father   . Kidney failure Brother     DRUG ALLERGIES:   Allergies  Allergen Reactions  . Aspirin Hives and Other (See Comments)    Reaction:  Causes pt to bleed.   . Celecoxib Other (See Comments)    Reaction:  Causes pt to bleed.     REVIEW OF SYSTEMS:  CONSTITUTIONAL:  No fever, Positive for  Fatigue and weakness.  RESPIRATORY: No cough, shortness of breath, wheezing or hemoptysis.  CARDIOVASCULAR:+ chest pain with exertion, orthopnea, edema.  GASTROINTESTINAL: No nausea, vomiting, diarrhea or abdominal pain.  GENITOURINARY: No dysuria, hematuria.  ENDOCRINE: No polyuria, nocturia,  HEMATOLOGY: No anemia, easy bruising or bleeding SKIN: No rash or lesion. MUSCULOSKELETAL: No joint pain or arthritis.   NEUROLOGIC: Positive for tingling and numbness for both legs and both hands since his chemo.  PSYCHIATRY: No anxiety or depression.   MEDICATIONS AT HOME:   Prior to Admission medications   Medication Sig Start Date End Date Taking? Authorizing Provider  atorvastatin (LIPITOR) 40 MG tablet Take 40 mg by mouth at bedtime.   Yes Historical Provider, MD  doxazosin (CARDURA) 2 MG tablet Take 2 mg by mouth at bedtime.   Yes Historical Provider, MD  glipiZIDE (GLUCOTROL XL) 10 MG 24 hr tablet Take 10 mg by mouth 2 (two) times daily.   Yes Historical Provider, MD  magnesium oxide (MAG-OX) 400 MG tablet Take 400 mg by mouth 2 (two) times daily.   Yes Historical Provider, MD  metoprolol tartrate (LOPRESSOR) 25 MG tablet Take 12.5 mg by mouth 2 (two) times daily.    Yes Historical Provider, MD  acetaminophen (TYLENOL) 325 MG tablet Take 650 mg by mouth every 4 (four) hours as needed.    Historical Provider, MD  cetirizine (ZYRTEC) 10 MG tablet Take 10 mg by mouth daily.    Historical Provider, MD  dicyclomine (BENTYL) 20 MG tablet Take 1 tablet (20 mg total) by mouth every 6 (six) hours. As needed for abdominal pain Patient not taking: Reported on 10/18/2014 10/13/14   Boris Lown, DO  dofetilide (TIKOSYN) 500 MCG capsule Take 500 mcg by mouth every 12 (twelve) hours.     Historical Provider, MD  enalapril (VASOTEC) 20 MG tablet Take 20 mg by mouth 2 (two) times daily.     Historical Provider, MD  insulin detemir (LEVEMIR) 100 UNIT/ML injection Inject 15 Units into  the skin at bedtime.    Historical Provider, MD  lidocaine-prilocaine (EMLA) cream Apply 1 application topically once.    Historical Provider, MD  loperamide (IMODIUM A-D) 2 MG tablet Take 1 tablet (2 mg total) by mouth 4 (four) times daily as needed for diarrhea or loose stools. 10/16/14   Sheryl Precious Haws, DO  megestrol (MEGACE) 40 MG/ML suspension Take 40 mg by mouth daily.    Historical Provider, MD  ondansetron (ZOFRAN-ODT) 8 MG disintegrating tablet Take 1 tablet (8 mg total) by mouth every 8 (eight) hours as needed for nausea. Patient not taking: Reported on 10/18/2014 10/13/14   Boris Lown, DO  pantoprazole (PROTONIX) 40 MG tablet Take 40 mg by mouth daily.    Historical Provider, MD  ranitidine (ZANTAC) 150 MG tablet Take 150 mg by mouth 2 (two) times daily.    Historical Provider, MD  warfarin (COUMADIN) 1 MG tablet Take 2 mg by mouth at bedtime.    Historical Provider, MD  warfarin (COUMADIN) 5 MG tablet Take 5 mg by mouth at bedtime.    Historical Provider, MD      VITAL SIGNS:  Blood pressure 130/78, pulse 164, temperature 99 F (37.2 C), temperature source Oral, resp. rate 20, height _0  (1.727 m), weight 76.613 kg (168 lb 14.4 oz), SpO2 100 %.  PHYSICAL EXAMINATION:  GENERAL:  79 y.o.-year-old patient lying in the bed with no acute distress. Thin EYES: Pupils post surgical, equal, round, reactive to light and accommodation. No scleral icterus. Extraocular muscles intact.  HEENT: Head atraumatic, normocephalic. Oropharynx and nasopharynx clear.  NECK:  Supple, no jugular venous distention. No thyroid enlargement, no tenderness.  LUNGS: Normal breath sounds bilaterally, no wheezing, rales,rhonchi or crepitation. No use of accessory muscles of respiration.  CARDIOVASCULAR: S1, S2 normal. 3/6 systolic murmur present, No rubs, or gallops.  ABDOMEN: Soft, nontender, nondistended. Bowel sounds present. No organomegaly or mass.  EXTREMITIES: No pedal edema, cyanosis, or clubbing.   NEUROLOGIC: Cranial nerves II through XII are intact. Muscle strength 5/5 in all extremities. Sensation intact. Gait not checked. Generalized weakness present PSYCHIATRIC: The patient is alert and oriented x 3.  SKIN: No obvious rash, lesion, or ulcer.   LABORATORY PANEL:   CBC  Recent Labs Lab 10/21/14 0421  WBC 3.7*  HGB 8.5*  HCT 24.5*  PLT 27*   ------------------------------------------------------------------------------------------------------------------  Chemistries   Recent Labs Lab 10/18/14 1107  10/21/14 0421  NA 130*  < > 135  K 4.7  < > 3.9  CL 108  < > 113*  CO2 18*  < > 17*  GLUCOSE 161*  < > 118*  BUN 44*  < > 21*  CREATININE 2.77*  < > 1.68*  CALCIUM  8.4*  < > 8.0*  MG 2.1  --   --   AST 19  < > 17  ALT 14*  < > 12*  ALKPHOS 53  < > 45  BILITOT 0.7  < > 1.0  < > = values in this interval not displayed. ------------------------------------------------------------------------------------------------------------------  Cardiac Enzymes No results for input(s): TROPONINI in the last 168 hours. ------------------------------------------------------------------------------------------------------------------  RADIOLOGY:  No results found.  EKG:   Orders placed or performed during the hospital encounter of 10/18/14  . EKG 12-Lead  . EKG 12-Lead    IMPRESSION AND PLAN:   78y/oM with multiple myeloma on chemo, HTN, DM, CAD s/p PCI, Afib on coumadin, BPH, GERD admitted for generalised weakness and noted to have acute on chronic renal failure and anemia. Medical consult requested for Medical management  1. Acute anemia- secondary to multiple myeloma hgb improved since transfusion, now up to 8.5, stable Mgmt per oncology, Transfuse for hb <7 Hold coumadin- likely not a good candidate for warfarin with significant anemia  2. Acute on chronic renal failure- baseline cr of 2.0, at 2.7 on adm: improved and back to baseline Prerenal likely and also  multiple myeloma Decrease IVF Nephrology consulted Hold ACEI  3. Throbocytopenia- stable chronic, monitor, Tx plts if plt count <20k  4. CAD s/p PCI- stable, does have CP with exertion cardiology following, on  metoprolol and statin. No asa due to anemia and thrombocytopenia  5. DM- controlled Hold glipizide while inpatient Continue ssi  6. Multiple Myeloma- Progressive multiple myeloma, on third line therapy. Overall poor prognosis. Mgmt per oncology team Agree with palliative care consult  7. Afib- Tikosyn held due to renal failure and prolonged QT, Adjust elecrtolytes Rate controlled with metoprolol, coumadin held as well due to anemia Metoprolol dose increased overnight, still tachycardic with exertion  8. DVT prophylaxis- TEDs, SCDs  9. Deconditioning - will need HHPT and rolling walker   All the records are reviewed and case discussed with Consulting provider. Management plans discussed with the patient, family and they are in agreement.  CODE STATUS: Full Code  TOTAL TIME TAKING CARE OF THIS PATIENT: 25 minutes.    Myrtis Ser M.D on 10/21/2014 at 4:02 PM  Between 7am to 6pm - Pager - [phone]  After 6pm go to [email or web address] - password [email or web address] [facility]  Office  [phone]  CC: Primary care Physician: Petra Kuba, MD

## 2014-10-21 NOTE — Plan of Care (Signed)
Problem: Discharge Progression Outcomes Goal: [facility] if indicated Outcome: Progressing Patient had good day today, walked around unit with PT.  Remained alert and oriented x 4.  Wife and family at bedside today.  Remains on telemetry running between NSR sometimes Afib with pvc's typically when up with activity.  Blood pressure much improved today.

## 2014-10-21 NOTE — Consult Note (Signed)
Palliative Medicine Inpatient Consult Note   Name: Peter Walters Date: 10/21/2014 MRN: [id number]  DOB: [birth year]  Referring Physician: Lequita Asal, MD  Palliative Care consult requested for this 79 y.o. male for goals of medical therapy in patient with Multiple myeloma with FISH and 13Q deletion, diastolic CM, a-fib (on blood thinners), CAD, HLD, BPPV, BPH and T2DM.  Pt presented to cancer center on 10-18-2014 for increasing lethargy and decreased intake.  Initial labs reveal panycytopenia and hyponatremia. Pt was admitted for dehydration. Pt currently resting at side of bed and eating breakfast.  Wife at bedside.  Pt is former Psychologist, sport and exercise.  Pt lives at home with wife of 28 years.  Pt has 5 children all are involved in care.      REVIEW OF SYSTEMS:  Pain: None Dyspnea:  No Nausea/Vomiting:  No Diarrhea:  No Depression:   No Anxiety:   No Fatigue:   Yes and improving with hydration  SPIRITUAL SUPPORT SYSTEM: Yes.  SOCIAL HISTORY:  reports that he has never smoked. He does not have any smokeless tobacco history on file. He reports that he does not drink alcohol or use illicit drugs.    CODE STATUS: Full code  PAST MEDICAL HISTORY: Past Medical History  Diagnosis Date  . A-fib   . Hypertension   . Benign prostatic hypertrophy   . History of gastrointestinal bleeding   . Hyperlipidemia   . History of multiple myeloma   . CAD (coronary artery disease)   . GERD (gastroesophageal reflux disease)   . CHF (congestive heart failure)   . Peptic ulcer disease   . Hyponatremia   . Multiple myeloma in relapse 10/17/2014  . Cancer     multiple myeloma  . Diabetes mellitus without complication   . 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 Cardiac catheterization    . Coronary angioplasty      [facility]  . Eye surgery      ALLERGIES:  is allergic to aspirin and  celecoxib.  MEDICATIONS:  Current Facility-Administered Medications  Medication Dose Route Frequency Provider Last Rate Last Dose  . 0.9 %  sodium chloride infusion   Intravenous Continuous Aldean Jewett, MD 50 mL/hr at 10/20/14 1445    . acetaminophen (TYLENOL) tablet 650 mg  650 mg Oral Q4H PRN Evlyn Kanner, NP      . alum & mag hydroxide-simeth (MAALOX/MYLANTA) 200-200-20 MG/5ML suspension 60 mL  60 mL Oral Q4H PRN Evlyn Kanner, NP      . amLODipine (NORVASC) tablet 10 mg  10 mg Oral Daily Corey Skains, MD   10 mg at 10/20/14 0931  . atorvastatin (LIPITOR) tablet 40 mg  40 mg Oral QHS Evlyn Kanner, NP   40 mg at 10/20/14 2137  . calcium-vitamin D (OSCAL WITH D) 500-200 MG-UNIT per tablet 1 tablet  1 tablet Oral Q breakfast Lequita Asal, MD      . doxazosin (CARDURA) tablet 2 mg  2 mg Oral QHS Evlyn Kanner, NP   2 mg at 10/20/14 2137  . feeding supplement (GLUCERNA SHAKE) (GLUCERNA SHAKE) liquid 237 mL  237 mL Oral BID WC Lequita Asal, MD   237 mL at 10/20/14 1709  . insulin aspart (novoLOG) injection 0-9 Units  0-9 Units Subcutaneous TID WC Evlyn Kanner, NP   2 Units at  10/20/14 1710  . lidocaine-prilocaine (EMLA) cream 1 application  1 application Topical Once Leslie F Herring, NP   1 application at 10/18/14 1727  . loratadine (CLARITIN) tablet 10 mg  10 mg Oral Daily Leslie F Herring, NP   10 mg at 10/20/14 0932  . megestrol (MEGACE) 40 MG/ML suspension 40 mg  40 mg Oral Daily Leslie F Herring, NP   40 mg at 10/20/14 0932  . metoprolol tartrate (LOPRESSOR) tablet 25 mg  25 mg Oral BID Bruce J Kowalski, MD   25 mg at 10/20/14 2137  . ondansetron (ZOFRAN) injection 4 mg  4 mg Intravenous Q8H PRN Leslie F Herring, NP      . oxyCODONE (Oxy IR/ROXICODONE) immediate release tablet 5 mg  5 mg Oral Q4H PRN Leslie F Herring, NP   5 mg at 10/20/14 1606  . pantoprazole (PROTONIX) EC tablet 40 mg  40 mg Oral QAC breakfast Leslie F Herring, NP   40 mg at 10/20/14  0930  . senna-docusate (Senokot-S) tablet 1 tablet  1 tablet Oral QHS PRN Leslie F Herring, NP        Vital Signs: BP 160/81 mmHg  Pulse 90  Temp(Src) 99.1 F (37.3 C) (Oral)  Resp 20  Ht 5' 8" (1.727 m)  Wt 76.613 kg (168 lb 14.4 oz)  BMI 25.69 kg/m2  SpO2 96% Filed Weights   10/18/14 1559 10/20/14 0501  Weight: 72.122 kg (159 lb) 76.613 kg (168 lb 14.4 oz)    Estimated body mass index is 25.69 kg/(m^2) as calculated from the following:   Height as of this encounter: 5' 8" (1.727 m).   Weight as of this encounter: 76.613 kg (168 lb 14.4 oz).  PERFORMANCE STATUS (ECOG) : 3 - Symptomatic, >50% confined to bed  PHYSICAL EXAM: General appearance: alert, cooperative, appears stated age, fatigued and no distress Head: Normocephalic, without obvious abnormality, atraumatic Neck: supple, symmetrical, trachea midline and thyroid not enlarged, symmetric, no tenderness/mass/nodules Resp: clear to auscultation bilaterally Cardio: regular rate and rhythm, S1, S2 normal, no murmur, click, rub or gallop GI: soft, non-tender; bowel sounds normal; no masses,  no organomegaly Extremities: extremities normal, atraumatic, no cyanosis or edema Neurologic: Grossly normal  LABS: CBC:    Component Value Date/Time   WBC 3.7* 10/21/2014 0421   WBC 3.5* 10/07/2014 0940   HGB 8.5* 10/21/2014 0421   HGB 8.7* 10/07/2014 0940   HCT 24.5* 10/21/2014 0421   HCT 25.7* 10/07/2014 0940   PLT 27* 10/21/2014 0421   PLT 27* 10/07/2014 0940   MCV 88.0 10/21/2014 0421   MCV 91 10/07/2014 0940   NEUTROABS 1.8 10/20/2014 0435   NEUTROABS 1.2* 10/07/2014 0940   LYMPHSABS 1.1 10/20/2014 0435   LYMPHSABS 1.8 10/07/2014 0940   MONOABS 0.3 10/20/2014 0435   MONOABS 0.3 10/07/2014 0940   EOSABS 0.2 10/20/2014 0435   EOSABS 0.1 10/07/2014 0940   BASOSABS 0.0 10/20/2014 0435   BASOSABS 0.0 10/07/2014 0940   Comprehensive Metabolic Panel:    Component Value Date/Time   NA 135 10/21/2014 0421   NA 125*  10/07/2014 0940   K 3.9 10/21/2014 0421   K 5.1 10/07/2014 0940   CL 113* 10/21/2014 0421   CL 102 10/07/2014 0940   CO2 17* 10/21/2014 0421   CO2 19* 10/07/2014 0940   BUN 21* 10/21/2014 0421   BUN 36* 10/07/2014 0940   CREATININE 1.68* 10/21/2014 0421   CREATININE 1.80* 10/07/2014 0940   GLUCOSE 118* 10/21/2014 0421   GLUCOSE   342* 10/07/2014 0940   CALCIUM 8.0* 10/21/2014 0421   CALCIUM 8.7* 10/07/2014 0940   AST 17 10/21/2014 0421   AST 21 10/07/2014 0940   ALT 12* 10/21/2014 0421   ALT 20 10/07/2014 0940   ALKPHOS 45 10/21/2014 0421   ALKPHOS 58 10/07/2014 0940   BILITOT 1.0 10/21/2014 0421   PROT 10.6* 10/21/2014 0421   PROT > 12.0* 10/07/2014 0940   ALBUMIN 2.6* 10/21/2014 0421   ALBUMIN 3.4* 10/07/2014 0940    IMPRESSION:  Palliative Care consult requested for this 78 y.o. male for goals of medical therapy in patient with Multiple myeloma with FISH and 13Q deletion, diastolic CM, a-fib (on blood thinners), CAD, HLD, BPPV, BPH and T2DM.  Pt presented to cancer center on 10-18-2014 for increasing lethargy and decreased intake.  Initial labs reveal panycytopenia and hyponatremia. Pt was admitted for dehydration. Pt currently resting at side of bed and eating breakfast.  Wife at bedside.  Pt with continued pancytopenia this AM.  Hyponatremia has resolved. Wife and pt agree to goals of care conversation.  At baseline pt can perform all ADL's independently.  Pt enjoys fishing at home and working in garden.  Pt states before this acute exacerbation he was not home bound, but has been for last 3 weeks.  Pt to meet with oncology today as he is on third line treatment.    Pt states his understanding of his progressive multiple myeloma and is still hopeful for continued treatment. Pt confirms Full Code status and states he would never want tracheostomy or PEG tube.   PLAN: 1. Full code 2. D/C home when able    More than 50% of the visit was spent in counseling/coordination of care:  Yes  Time Spent: 75 minutes  

## 2014-10-21 NOTE — Progress Notes (Signed)
Physical Therapy Treatment Patient Details Name: GUS LITTLER MRN: [id number] DOB: [birth year] Today's Date: 10/21/2014    History of Present Illness Patient is a 79 y.o. male with stage III multiple myeloma, diagnosed in 2013, who is admitted from the oncology clinic with progressive weakness and worsening renal insufficiency. Patient has h/o CAD and CHF.    PT Comments    Pt with overall good participation in therapy session. Ambulated approx 300' x1 with no standing rests with FWW. Mild exertion at end of ambulation with SpO2 at 92% HR 146.  No significant c/o fatigue throughout session.  Performed dynamic balance activities with min/mod challenges with no LOB noted.  Reinforced sit to stnd without pushing LE on side of bed with improving control each time.  Escorted pt to BR with wife present at end of session.   Follow Up Recommendations  Home health PT;Supervision for mobility/OOB     Equipment Recommendations       Recommendations for Other Services       Precautions / Restrictions Restrictions Weight Bearing Restrictions: No    Mobility  Bed Mobility Overal bed mobility: Independent                Transfers Overall transfer level: Modified independent Equipment used: Rolling walker (2 wheeled) Transfers: Sit to/from Stand Sit to Stand: Min guard         General transfer comment: cues for PHP sit to stand, poor control stand to sit  Ambulation/Gait Ambulation/Gait assistance: Modified independent (Device/Increase time) Ambulation Distance (Feet): 300 Feet Assistive device: Rolling walker (2 wheeled) Gait Pattern/deviations: Step-through pattern     General Gait Details: step through slow cadance indicated increased exertion at end of ambulation   Stairs            Wheelchair Mobility    Modified Rankin (Stroke Patients Only)       Balance Overall balance assessment: Needs assistance Sitting-balance support: Feet supported                                 Cognition Arousal/Alertness: Awake/alert Behavior During Therapy: WFL for tasks assessed/performed Overall Cognitive Status: Within Functional Limits for tasks assessed                      Exercises      General Comments        Pertinent Vitals/Pain Pain Assessment: No/denies pain    Home Living                      Prior Function            PT Goals (current goals can now be found in the care plan section) Acute Rehab PT Goals PT Goal Formulation: With patient/family Time For Goal Achievement: 11/02/14 Potential to Achieve Goals: Good Progress towards PT goals: Progressing toward goals    Frequency  Min 2X/week    PT Plan Current plan remains appropriate    Co-evaluation             End of Session Equipment Utilized During Treatment: Gait belt Activity Tolerance: Patient limited by fatigue Patient left: in bed;with call bell/phone within reach;with family/visitor present     Time: 1030-1056 PT Time Calculation (min) (ACUTE ONLY): 26 min  Charges:  $Gait Training: 8-22 mins $Neuromuscular Re-education: 8-22 mins  G Codes:      Dayle Sherpa 11/19/14, 11:03 AM Franca Stakes, PTA

## 2014-10-21 NOTE — Progress Notes (Signed)
Chaplain responds to Rapid Response @9 : 44pm, assess patient stable after care team provide care. Chaplain talked with patient and spouse, spouse states he deals with anxiety. Chaplain provided encouragement and emotional support. Loralyn Freshwater D. Alroy Dust Monday 10-21-2014   10/21/14 2200  Clinical Encounter Type  Visited With Patient and family together  Visit Type Code (Rapid Response @ 9: 44pm)  Referral From Nurse  Consult/Referral To Chaplain  Recommendations (Dr. considers placing pt. on second floor for closer watch)  Spiritual Encounters  Spiritual Needs Prayer;Emotional  Stress Factors  Patient Stress Factors Health changes;Major life changes  Family Stress Factors Health changes;Major life changes

## 2014-10-21 NOTE — Progress Notes (Signed)
Subjective:   No N/V Good UOP S Cr improving   Objective:  Vital signs in last 24 hours:  Temp:  [99.1 F (37.3 C)] 99.1 F (37.3 C) (05/09 0521) Pulse Rate:  [81-92] 81 (05/09 1005) Resp:  [20] 20 (05/09 0521) BP: (136-160)/(80-85) 136/80 mmHg (05/09 1005) SpO2:  [96 %] 96 % (05/09 0521)  Weight change:  Filed Weights   10/18/14 1559 10/20/14 0501  Weight: 72.122 kg (159 lb) 76.613 kg (168 lb 14.4 oz)    Intake/Output: I/O last 3 completed shifts: In: 3550.5 [P.O.:240; I.V.:2991.5; Blood:319] Out: 8299 [Urine:4495]   Intake/Output this shift:  Total I/O In: 240 [P.O.:240] Out: 450 [Urine:450]  Physical Exam: General: NAD,   Head: Normocephalic, atraumatic. Moist oral mucosal membranes  Eyes: Anicteric,  Neck: Supple, trachea midline  Lungs:  Clear to auscultation  Heart: Regular rate and rhythm  Abdomen:  Soft, nontender,   Extremities:  peripheral edema.  Neurologic: Nonfocal, moving all four extremities  Skin: No lesions       Basic Metabolic Panel:  Recent Labs Lab 10/18/14 1107 10/19/14 0436 10/20/14 0435 10/21/14 0421  NA 130* 134* 132* 135  K 4.7 4.6 4.0 3.9  CL 108 113* 114* 113*  CO2 18* 18* 16* 17*  GLUCOSE 161* 133* 142* 118*  BUN 44* 34* 27* 21*  CREATININE 2.77* 2.23* 1.85* 1.68*  CALCIUM 8.4* 7.6* 7.7* 8.0*  MG 2.1  --   --   --   PHOS  --   --  1.6*  --     Liver Function Tests:  Recent Labs Lab 10/18/14 1107 10/19/14 0436 10/21/14 0421  AST 19 17 17   ALT 14* 12* 12*  ALKPHOS 53 41 45  BILITOT 0.7 0.8 1.0  PROT >12.0* 10.5* 10.6*  ALBUMIN 3.1* 2.6* 2.6*   No results for input(s): LIPASE, AMYLASE in the last 168 hours. No results for input(s): AMMONIA in the last 168 hours.  CBC:  Recent Labs Lab 10/15/14 1451 10/18/14 1107 10/19/14 0436 10/20/14 0435 10/21/14 0421  WBC 3.4* 3.7* 2.4* 3.4* 3.7*  NEUTROABS  --  1.5  --  1.8  --   HGB 6.7* 7.4* 6.2* 8.5* 8.5*  HCT 19.5* 21.7* 18.4* 24.0* 24.5*  MCV 91.3  91.5 91.6 88.3 88.0  PLT 23* 32* 26* 28* 27*    Cardiac Enzymes: No results for input(s): CKTOTAL, CKMB, CKMBINDEX, TROPONINI in the last 168 hours.  BNP: Invalid input(s): POCBNP  CBG:  Recent Labs Lab 10/20/14 1138 10/20/14 1627 10/20/14 2204 10/21/14 0740 10/21/14 1126  GLUCAP 217* 189* 148* 113* 215*    Microbiology: Results for orders placed or performed during the hospital encounter of 09/18/14  Stool culture     Status: None   Collection Time: 09/18/14 11:07 PM  Result Value Ref Range Status   Micro Text Report   Final       COMMENT                   NO SALMONELLA OR SHIGELLA ISOLATED   COMMENT                   NO PATHOGENIC E.COLI DETECTED   COMMENT                   NO CAMPYLOBACTER ANTIGEN DETECTED   ANTIBIOTIC  Clostridium Difficile [facility])     Status: None   Collection Time: 09/19/14  4:06 AM  Result Value Ref Range Status   Micro Text Report   Final       C.DIFFICILE ANTIGEN       C.DIFFICILE GDH ANTIGEN : NEGATIVE   C.DIFFICILE TOXIN A/B     C.DIFFICILE TOXINS A AND B : NEGATIVE   INTERPRETATION            Negative for C. difficile.    ANTIBIOTIC                                                        Coagulation Studies:  Recent Labs  10/18/14 1849 10/19/14 0436 10/20/14 0435 10/21/14 0421  LABPROT 19.1* 18.9* 21.5* 19.7*  INR 1.59 1.56 1.85 1.65    Urinalysis: No results for input(s): COLORURINE, LABSPEC, PHURINE, GLUCOSEU, HGBUR, BILIRUBINUR, KETONESUR, PROTEINUR, UROBILINOGEN, NITRITE, LEUKOCYTESUR in the last 72 hours.  Invalid input(s): APPERANCEUR    Imaging: US Renal  10/19/2014   CLINICAL DATA:  Acute renal failure, history atrial fibrillation, hypertension, multiple myeloma, coronary artery disease, CHF, diabetes  EXAM: RENAL / URINARY TRACT ULTRASOUND COMPLETE  COMPARISON:  CT abdomen and pelvis 10/13/2014  FINDINGS: Right Kidney:  Length: 10.7 cm. Normal cortical  thickness. Upper normal cortical echogenicity. No mass, hydronephrosis or shadowing calcification. No perinephric fluid.  Left Kidney:  Length: 10.6 cm. Normal cortical thickness with upper normal cortical echogenicity. No mass, hydronephrosis or shadowing calcification. No perinephric fluid.  Bladder:  Normal appearance.  IMPRESSION: No renal sonographic abnormalities identified.   Electronically Signed   By: Lavonia Dana M.D.   On: 10/19/2014 15:43     Medications:   . sodium chloride 50 mL/hr at 10/21/14 1114   . amLODipine  10 mg Oral Daily  . atorvastatin  40 mg Oral QHS  . calcium-vitamin D  1 tablet Oral Q breakfast  . doxazosin  2 mg Oral QHS  . feeding supplement (GLUCERNA SHAKE)  237 mL Oral BID WC  . insulin aspart  0-9 Units Subcutaneous TID WC  . lidocaine-prilocaine  1 application Topical Once  . loratadine  10 mg Oral Daily  . megestrol  40 mg Oral Daily  . metoprolol tartrate  25 mg Oral BID  . pantoprazole  40 mg Oral QAC breakfast   acetaminophen, alum & mag hydroxide-simeth, [DISCONTINUED] ondansetron **OR** [DISCONTINUED] ondansetron **OR** ondansetron (ZOFRAN) IV **OR** [DISCONTINUED] ondansetron (ZOFRAN) IV, oxyCODONE, senna-docusate  Assessment/ Plan:  [name] is a 79 y.o.black male with multiple myeloma, atrial fibrillation, hypertension, hyperlipidmia, coronary artery disease status post stent, RIJ port, anemia, history of peptic ulcer disease, GERD, diabetes mellitus type II, BPH and allergic rhinitis.   Patient was admitted to [facility] on 10/18/2014 with progressive weakness.   1. Acute renal failure on chronic kidney disease stage III with hyponatremia, hyperkalemia and proteinuria: N17.9, N18.3, E87.1, E87.5, R80.9 - Chronic Kidney Disease is secondary to multiple myeloma. Creatinine improving.  -- continue iv fluids - holding enalapril due to acute renal failure and hyperkalemia  2. Acute pancytopenia: leukocytopenia, anemia and thrombocytopenia - PRBC  transfusion 5/7.   3. Hypertension: well controlled on admission. Holding enalapril - metorpolol, doxazosin  4. Diabetes Mellitus type II with renal manifestations: with proteinuria - on glipizide.    LOS: 3  Peter Walters 5/9/20161:11 PM

## 2014-10-21 NOTE — Progress Notes (Signed)
[facility] Hematology/Oncology Progress Note  Date of admission: 10/18/2014  Hospital day:  10/21/2014  Chief Complaint: Peter Walters is a 79 y.o. male who was admitted on 10/18/2014 with progressive weakness, renal insufficiency, and pancytopenia.  Subjective: No acute events overnight. Patient continues to have a mild nosebleed, but otherwise feels well. His appetite has significantly improved. He denies any pain. Patient offers no further specific complaints today.  Social History: The patient is accompanied by his wife  Allergies:  Allergies  Allergen Reactions  . Aspirin Hives and Other (See Comments)    Reaction:  Causes pt to bleed.   . Celecoxib Other (See Comments)    Reaction:  Causes pt to bleed.     Medications Prior to Admission  Medication Sig Dispense Refill  . atorvastatin (LIPITOR) 40 MG tablet Take 40 mg by mouth at bedtime.    Marland Kitchen doxazosin (CARDURA) 2 MG tablet Take 2 mg by mouth at bedtime.    Marland Kitchen glipiZIDE (GLUCOTROL XL) 10 MG 24 hr tablet Take 10 mg by mouth 2 (two) times daily.    . magnesium oxide (MAG-OX) 400 MG tablet Take 400 mg by mouth 2 (two) times daily.    . metoprolol tartrate (LOPRESSOR) 25 MG tablet Take 12.5 mg by mouth 2 (two) times daily.     Marland Kitchen acetaminophen (TYLENOL) 325 MG tablet Take 650 mg by mouth every 4 (four) hours as needed.    . cetirizine (ZYRTEC) 10 MG tablet Take 10 mg by mouth daily.    Marland Kitchen dicyclomine (BENTYL) 20 MG tablet Take 1 tablet (20 mg total) by mouth every 6 (six) hours. As needed for abdominal pain (Patient not taking: Reported on 10/18/2014) 20 tablet 0  . dofetilide (TIKOSYN) 500 MCG capsule Take 500 mcg by mouth every 12 (twelve) hours.     . enalapril (VASOTEC) 20 MG tablet Take 20 mg by mouth 2 (two) times daily.     . insulin detemir (LEVEMIR) 100 UNIT/ML injection Inject 15 Units into the skin at bedtime.    . lidocaine-prilocaine (EMLA) cream Apply 1 application topically once.    . loperamide  (IMODIUM A-D) 2 MG tablet Take 1 tablet (2 mg total) by mouth 4 (four) times daily as needed for diarrhea or loose stools. 30 tablet 0  . megestrol (MEGACE) 40 MG/ML suspension Take 40 mg by mouth daily.    . ondansetron (ZOFRAN-ODT) 8 MG disintegrating tablet Take 1 tablet (8 mg total) by mouth every 8 (eight) hours as needed for nausea. (Patient not taking: Reported on 10/18/2014) 30 tablet 0  . pantoprazole (PROTONIX) 40 MG tablet Take 40 mg by mouth daily.    . ranitidine (ZANTAC) 150 MG tablet Take 150 mg by mouth 2 (two) times daily.    Marland Kitchen warfarin (COUMADIN) 1 MG tablet Take 2 mg by mouth at bedtime.    Marland Kitchen warfarin (COUMADIN) 5 MG tablet Take 5 mg by mouth at bedtime.      Review of Systems: GENERAL:  Feels better.  Working with PT.  No fevers, sweats or weight loss. PERFORMANCE STATUS (ECOG):  2 Lungs: No shortness of breath or cough.  No hemoptysis. Cardiac:  No chest pain, palpitations, orthopnea, or PND. GI:  No nausea, vomiting, diarrhea, constipation, melena or hematochezia. Musculoskeletal:  No back pain.  No joint pain.  No muscle tenderness. Extremities:  No pain or swelling. Skin:  No rashes or skin changes. Psych:  No mood changes, depression or anxiety. Pain:  No  focal pain. Review of systems:  All other systems reviewed and found to be negative.  Physical Exam: Blood pressure 130/78, pulse 164, temperature 99 F (37.2 C), temperature source Oral, resp. rate 20, height 5' 8"  (1.727 m), weight 168 lb 14.4 oz (76.613 kg), SpO2 100 %.  GENERAL: Thin elderly gentleman sitting comfortably in bed on the medical unit.  MENTAL STATUS: Alert and oriented to person, place and time. HEAD: Short graying hair. Mustache. Temporal wasting. Normocephalic, atraumatic, face symmetric, no Cushingoid features. EYES: Brown eyes. Right pupil reactive to light. Keeps left eye closed secondary to sensitivity to light (chronic). No conjunctivitis or scleral icterus. ENT: Oropharynx clear  without lesion. Tongue normal. Mucous membranes moist.  RESPIRATORY: Clear to auscultation without rales, wheezes or rhonchi. CARDIOVASCULAR: Regular rate and rhythm without murmur, rub or gallop. ABDOMEN: Soft, non-tender, with active bowel sounds, and no hepatosplenomegaly. No masses. SKIN: No rashes, ulcers or lesions. EXTREMITIES: No edema, no skin discoloration or tenderness. No palpable cords. NEUROLOGICAL: Unremarkable. PSYCH: Appropriate  Results for orders placed or performed during the hospital encounter of 10/18/14 (from the past 48 hour(s))  Glucose, capillary     Status: Abnormal   Collection Time: 10/19/14  4:42 PM  Result Value Ref Range   Glucose-Capillary 209 (H) 70 - 99 mg/dL  CBC with Differential     Status: Abnormal   Collection Time: 10/20/14  4:35 AM  Result Value Ref Range   WBC 3.4 (L) 3.8 - 10.6 K/uL    Comment: CORRECTED ON 05/08 AT 0805: PREVIOUSLY REPORTED AS 3.3   RBC 2.72 (L) 4.40 - 5.90 MIL/uL    Comment: CORRECTED ON 05/08 AT 0805: PREVIOUSLY REPORTED AS 2.69   Hemoglobin 8.5 (L) 13.0 - 18.0 g/dL    Comment: CORRECTED ON 05/08 AT 0805: PREVIOUSLY REPORTED AS 8.3   HCT 24.0 (L) 40.0 - 52.0 %   MCV 88.3 80.0 - 100.0 fL    Comment: CORRECTED ON 05/08 AT 0805: PREVIOUSLY REPORTED AS 88.9   MCH 31.1 26.0 - 34.0 pg    Comment: CORRECTED ON 05/08 AT 0805: PREVIOUSLY REPORTED AS 30.8   MCHC 35.2 32.0 - 36.0 g/dL    Comment: CORRECTED ON 05/08 AT 0805: PREVIOUSLY REPORTED AS 34.7   RDW 18.8 (H) 11.5 - 14.5 %    Comment: CORRECTED ON 05/08 AT 0805: PREVIOUSLY REPORTED AS 18.4   Platelets 28 (LL) 150 - 440 K/uL    Comment: CORRECTED ON 05/08 AT 0805: PREVIOUSLY REPORTED AS 28 PLATELET COUNT CONFIRMED BY SMEAR CRITICAL VALUE NOTED.  VALUE IS CONSISTENT WITH PREVIOUSLY REPORTED AND CALLED VALUE.   Neutrophils Relative % 53 %   Lymphocytes Relative 31 %   Monocytes Relative 10 %   Eosinophils Relative 5 %   Basophils Relative 1 %   Neutro Abs 1.8 1.4  - 6.5 K/uL   Lymphs Abs 1.1 1.0 - 3.6 K/uL   Monocytes Absolute 0.3 0.2 - 1.0 K/uL   Eosinophils Absolute 0.2 0 - 0.7 K/uL   Basophils Absolute 0.0 0 - 0.1 K/uL  Basic metabolic panel     Status: Abnormal   Collection Time: 10/20/14  4:35 AM  Result Value Ref Range   Sodium 132 (L) 135 - 145 mmol/L   Potassium 4.0 3.5 - 5.1 mmol/L   Chloride 114 (H) 101 - 111 mmol/L   CO2 16 (L) 22 - 32 mmol/L   Glucose, Bld 142 (H) 65 - 99 mg/dL   BUN 27 (H) 6 - 20 mg/dL  Creatinine, Ser 1.85 (H) 0.61 - 1.24 mg/dL   Calcium 7.7 (L) 8.9 - 10.3 mg/dL   GFR calc non Af Amer 34 (L) >60 mL/min   GFR calc Af Amer 39 (L) >60 mL/min    Comment: (NOTE) The eGFR has been calculated using the CKD EPI equation. This calculation has not been validated in all clinical situations. eGFR's persistently <60 mL/min signify possible Chronic Kidney Disease.    Anion gap 2 (L) 5 - 15  Protime-INR     Status: Abnormal   Collection Time: 10/20/14  4:35 AM  Result Value Ref Range   Prothrombin Time 21.5 (H) 11.4 - 15.0 seconds   INR 1.85   Parathyroid hormone, intact (no Ca)     Status: None   Collection Time: 10/20/14  4:35 AM  Result Value Ref Range   PTH 21 15 - 65 pg/mL    Comment: (NOTE) Performed At: [facility] [place], [place] [id number] Lindon Romp MD UE:[id number]   Phosphorus     Status: Abnormal   Collection Time: 10/20/14  4:35 AM  Result Value Ref Range   Phosphorus 1.6 (L) 2.5 - 4.6 mg/dL  Glucose, capillary     Status: None   Collection Time: 10/20/14  7:50 AM  Result Value Ref Range   Glucose-Capillary 87 70 - 99 mg/dL   Comment 1 Notify RN   Glucose, capillary     Status: Abnormal   Collection Time: 10/20/14 11:38 AM  Result Value Ref Range   Glucose-Capillary 217 (H) 70 - 99 mg/dL   Comment 1 Notify RN   Glucose, capillary     Status: Abnormal   Collection Time: 10/20/14  4:27 PM  Result Value Ref Range   Glucose-Capillary 189 (H) 70 - 99 mg/dL    Comment 1 Notify RN   Glucose, capillary     Status: Abnormal   Collection Time: 10/20/14 10:04 PM  Result Value Ref Range   Glucose-Capillary 148 (H) 70 - 99 mg/dL   Comment 1 Notify RN   CBC     Status: Abnormal   Collection Time: 10/21/14  4:21 AM  Result Value Ref Range   WBC 3.7 (L) 3.8 - 10.6 K/uL   RBC 2.78 (L) 4.40 - 5.90 MIL/uL   Hemoglobin 8.5 (L) 13.0 - 18.0 g/dL   HCT 24.5 (L) 40.0 - 52.0 %   MCV 88.0 80.0 - 100.0 fL   MCH 30.6 26.0 - 34.0 pg   MCHC 34.7 32.0 - 36.0 g/dL   RDW 18.8 (H) 11.5 - 14.5 %   Platelets 27 (LL) 150 - 440 K/uL    Comment: CRITICAL VALUE NOTED.  VALUE IS CONSISTENT WITH PREVIOUSLY REPORTED AND CALLED VALUE. PLATELET COUNT CONFIRMED BY SMEAR   Comprehensive metabolic panel     Status: Abnormal   Collection Time: 10/21/14  4:21 AM  Result Value Ref Range   Sodium 135 135 - 145 mmol/L   Potassium 3.9 3.5 - 5.1 mmol/L   Chloride 113 (H) 101 - 111 mmol/L   CO2 17 (L) 22 - 32 mmol/L   Glucose, Bld 118 (H) 65 - 99 mg/dL   BUN 21 (H) 6 - 20 mg/dL   Creatinine, Ser 1.68 (H) 0.61 - 1.24 mg/dL   Calcium 8.0 (L) 8.9 - 10.3 mg/dL   Total Protein 10.6 (H) 6.5 - 8.1 g/dL   Albumin 2.6 (L) 3.5 - 5.0 g/dL   AST 17 15 - 41 U/L   ALT  12 (L) 17 - 63 U/L   Alkaline Phosphatase 45 38 - 126 U/L   Total Bilirubin 1.0 0.3 - 1.2 mg/dL   GFR calc non Af Amer 38 (L) >60 mL/min   GFR calc Af Amer 44 (L) >60 mL/min    Comment: (NOTE) The eGFR has been calculated using the CKD EPI equation. This calculation has not been validated in all clinical situations. eGFR's persistently <60 mL/min signify possible Chronic Kidney Disease.    Anion gap 5 5 - 15  Protime-INR     Status: Abnormal   Collection Time: 10/21/14  4:21 AM  Result Value Ref Range   Prothrombin Time 19.7 (H) 11.4 - 15.0 seconds   INR 1.65   Glucose, capillary     Status: Abnormal   Collection Time: 10/21/14  7:40 AM  Result Value Ref Range   Glucose-Capillary 113 (H) 70 - 99 mg/dL  Glucose,  capillary     Status: Abnormal   Collection Time: 10/21/14 11:26 AM  Result Value Ref Range   Glucose-Capillary 215 (H) 70 - 99 mg/dL   Comment 1 Notify RN    US Renal  10/19/2014   CLINICAL DATA:  Acute renal failure, history atrial fibrillation, hypertension, multiple myeloma, coronary artery disease, CHF, diabetes  EXAM: RENAL / URINARY TRACT ULTRASOUND COMPLETE  COMPARISON:  CT abdomen and pelvis 10/13/2014  FINDINGS: Right Kidney:  Length: 10.7 cm. Normal cortical thickness. Upper normal cortical echogenicity. No mass, hydronephrosis or shadowing calcification. No perinephric fluid.  Left Kidney:  Length: 10.6 cm. Normal cortical thickness with upper normal cortical echogenicity. No mass, hydronephrosis or shadowing calcification. No perinephric fluid.  Bladder:  Normal appearance.  IMPRESSION: No renal sonographic abnormalities identified.   Electronically Signed   By: Lavonia Dana M.D.   On: 10/19/2014 15:43    Assessment:  Peter Walters is a 79 y.o. male with progressive stage III multiple myeloma on third line therapy. 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 He was admitted on 10/18/2014 with progressive weakness likely due to dehydration and pancytopenia secondary to progressive marrow involvement.   Plan: 1. Anemia: patient received 2 units PRBC this past weekend with improvement of his hemoglobin. He does not require additional transfusion at this time. 2. Thrombocytopenia: Platelet count still significantly decreased to 29, but stable. Mild nosebleed likely secondary to Coumadin still on board. No transfusion is needed at this time. Monitor.  3. Multiple myeloma: Treatment on hold temporarily. Discussed at length with patient and his wife that further treatments may not be possible given his severe thrombocytopenia. Plan to readdress treatments in 1 week upon discharge. 4. Elevated creatinine: Trending down. Appreciate nephrology input. 5. Cardiology- QT elevated.  Tikosyn discontinued. Patient on central telemetry. Appreciate cardiology consult. 6. Disposition: Plan on discharging tomorrow.     Lloyd Huger, MD 10/21/2014, 3:33 PM

## 2014-10-21 NOTE — Progress Notes (Signed)
Patient in rapid atrial fibrillation and heart rate originally 170s received 10 mg IV bolus Cardizem heart rate transiently responds still in the 150s to 160s will transfer to 2A/telemetry. He has received multiple oral agents as ordered by Dr. Josefa Half (cardiology). Ideally these medications will soon take effect and can continue when necessary Cardizem. If remains uncontrolled may require Cardizem drip. Case discussed with house supervisor as far as placement is concerned

## 2014-10-21 NOTE — Progress Notes (Signed)
2047  HR 199 on monitor afib, bp 153/105 pt standing by bedside using urinal, denies cp, assymptomatic, stated "my heart does that"  Pt assisted back to bed, wife at bedside.  Dr. Grayland Ormond notified. Ordered to monitor x 1 hour since just gave pm medications and if no change to give Norvasc 5 mg po once and if no change after 1 hour after Norvasc to notify cardiologist on call.  Donnita Falls, RN 10/21/2014

## 2014-10-21 NOTE — Progress Notes (Signed)
2312 pt to be transferred to room 244, report called to [facility] on 2a, family notified at bedside Donnita Falls, RN

## 2014-10-22 LAB — CBC WITH DIFFERENTIAL/PLATELET
Basophils Absolute: 0 10*3/uL (ref 0–0.1)
Basophils Relative: 0 %
EOS ABS: 0.1 10*3/uL (ref 0–0.7)
Eosinophils Relative: 4 %
HCT: 23.1 % — ABNORMAL LOW (ref 40.0–52.0)
HEMOGLOBIN: 7.9 g/dL — AB (ref 13.0–18.0)
Lymphocytes Relative: 34 %
Lymphs Abs: 1.3 10*3/uL (ref 1.0–3.6)
MCH: 30.3 pg (ref 26.0–34.0)
MCHC: 34.2 g/dL (ref 32.0–36.0)
MCV: 88.6 fL (ref 80.0–100.0)
MONO ABS: 0.4 10*3/uL (ref 0.2–1.0)
Monocytes Relative: 10 %
Neutro Abs: 1.9 10*3/uL (ref 1.4–6.5)
Neutrophils Relative %: 52 %
Platelets: 29 10*3/uL — CL (ref 150–440)
RBC: 2.6 MIL/uL — ABNORMAL LOW (ref 4.40–5.90)
RDW: 18.7 % — ABNORMAL HIGH (ref 11.5–14.5)
WBC: 3.7 10*3/uL — ABNORMAL LOW (ref 3.8–10.6)

## 2014-10-22 LAB — GLUCOSE, CAPILLARY
GLUCOSE-CAPILLARY: 207 mg/dL — AB (ref 70–99)
GLUCOSE-CAPILLARY: 159 mg/dL — AB (ref 70–99)
Glucose-Capillary: 248 mg/dL — ABNORMAL HIGH (ref 70–99)
Glucose-Capillary: 257 mg/dL — ABNORMAL HIGH (ref 70–99)
Glucose-Capillary: 254 mg/dL — ABNORMAL HIGH (ref 70–99)

## 2014-10-22 LAB — BASIC METABOLIC PANEL
Anion gap: 3 — ABNORMAL LOW (ref 5–15)
BUN: 23 mg/dL — ABNORMAL HIGH (ref 6–20)
CO2: 19 mmol/L — ABNORMAL LOW (ref 22–32)
Calcium: 7.6 mg/dL — ABNORMAL LOW (ref 8.9–10.3)
Chloride: 111 mmol/L (ref 101–111)
Creatinine, Ser: 1.65 mg/dL — ABNORMAL HIGH (ref 0.61–1.24)
GFR calc Af Amer: 45 mL/min — ABNORMAL LOW (ref >60)
GFR calc non Af Amer: 39 mL/min — ABNORMAL LOW (ref >60)
GLUCOSE: 189 mg/dL — AB (ref 65–99)
POTASSIUM: 3.5 mmol/L (ref 3.5–5.1)
SODIUM: 133 mmol/L — AB (ref 135–145)

## 2014-10-22 MED ORDER — DILTIAZEM HCL 25 MG/5ML IV SOLN
10.0000 mg | Freq: Once | INTRAVENOUS | Status: AC
  Administered 2014-10-22: 10 mg via INTRAVENOUS

## 2014-10-22 MED ORDER — DILTIAZEM HCL ER COATED BEADS 240 MG PO CP24
240.0000 mg | ORAL_CAPSULE | Freq: Every day | ORAL | Status: DC
  Administered 2014-10-22 – 2014-10-24 (×3): 240 mg via ORAL
  Filled 2014-10-22 (×3): qty 1

## 2014-10-22 MED ORDER — DEXTROSE 5 % IV SOLN
5.0000 mg/h | INTRAVENOUS | Status: DC
  Administered 2014-10-22: 15 mg/h via INTRAVENOUS
  Administered 2014-10-22: 5 mg/h via INTRAVENOUS
  Filled 2014-10-22 (×2): qty 100

## 2014-10-22 MED ORDER — INSULIN ASPART 100 UNIT/ML ~~LOC~~ SOLN
3.0000 [IU] | Freq: Once | SUBCUTANEOUS | Status: AC
  Administered 2014-10-22: 3 [IU] via SUBCUTANEOUS
  Filled 2014-10-22: qty 3

## 2014-10-22 NOTE — Progress Notes (Signed)
RN made Dr. Nehemiah Massed aware that patient remains in afib but heart rate has improved with rate in 70's. RN asked MD about PO cardizem 60mg  being ordered scheduled along with drip and 240mg  CD cardizem oral that was given this morning. Dr. Nehemiah Massed stated "d/c the cardizem drip and the 60mg  po dose."  Peter Walters

## 2014-10-22 NOTE — Progress Notes (Signed)
Received pt in transfer for a fib RVR. Bolused with cardizem 10 mg IVP x 1, cardizem drip started at 5 mg/hr, titrated up, currently at 20 mg/hr with heart rate 130-160.  Up to 170s while standing to void with freq unifocal PVCs. Denies complaints of chest pain or pressure. BP WNL.

## 2014-10-22 NOTE — Progress Notes (Signed)
**Note Peter-Identified via Obfuscation** [facility] Hematology/Oncology Progress Note  Date of admission: 10/18/2014  Hospital day:  10/22/2014  Chief Complaint: Peter Walters is a 79 y.o. male who was admitted on 10/18/2014 with progressive weakness, renal insufficiency, and pancytopenia.  Subjective: Patient underwent A. fib with rapid ventricular response overnight and was transferred to the ICU and is now on a Cardizem drip. Currently he feels well and is asymptomatic. He has no chest pain or shortness of breath. He does not complain of any further nosebleeds. His appetite has significantly improved. He denies any pain. Patient offers no further specific complaints today.  Social History: The patient is accompanied by his wife  Allergies:  Allergies  Allergen Reactions  . Aspirin Hives and Other (See Comments)    Reaction:  Causes pt to bleed.   . Celecoxib Other (See Comments)    Reaction:  Causes pt to bleed.     Medications Prior to Admission  Medication Sig Dispense Refill  . atorvastatin (LIPITOR) 40 MG tablet Take 40 mg by mouth at bedtime.    Marland Kitchen doxazosin (CARDURA) 2 MG tablet Take 2 mg by mouth at bedtime.    Marland Kitchen glipiZIDE (GLUCOTROL XL) 10 MG 24 hr tablet Take 10 mg by mouth 2 (two) times daily.    . magnesium oxide (MAG-OX) 400 MG tablet Take 400 mg by mouth 2 (two) times daily.    . metoprolol tartrate (LOPRESSOR) 25 MG tablet Take 12.5 mg by mouth 2 (two) times daily.     Marland Kitchen acetaminophen (TYLENOL) 325 MG tablet Take 650 mg by mouth every 4 (four) hours as needed.    . cetirizine (ZYRTEC) 10 MG tablet Take 10 mg by mouth daily.    Marland Kitchen dicyclomine (BENTYL) 20 MG tablet Take 1 tablet (20 mg total) by mouth every 6 (six) hours. As needed for abdominal pain (Patient not taking: Reported on 10/18/2014) 20 tablet 0  . dofetilide (TIKOSYN) 500 MCG capsule Take 500 mcg by mouth every 12 (twelve) hours.     . enalapril (VASOTEC) 20 MG tablet Take 20 mg by mouth 2 (two) times daily.     . insulin detemir  (LEVEMIR) 100 UNIT/ML injection Inject 15 Units into the skin at bedtime.    . lidocaine-prilocaine (EMLA) cream Apply 1 application topically once.    . loperamide (IMODIUM A-D) 2 MG tablet Take 1 tablet (2 mg total) by mouth 4 (four) times daily as needed for diarrhea or loose stools. 30 tablet 0  . megestrol (MEGACE) 40 MG/ML suspension Take 40 mg by mouth daily.    . ondansetron (ZOFRAN-ODT) 8 MG disintegrating tablet Take 1 tablet (8 mg total) by mouth every 8 (eight) hours as needed for nausea. (Patient not taking: Reported on 10/18/2014) 30 tablet 0  . pantoprazole (PROTONIX) 40 MG tablet Take 40 mg by mouth daily.    . ranitidine (ZANTAC) 150 MG tablet Take 150 mg by mouth 2 (two) times daily.    Marland Kitchen warfarin (COUMADIN) 1 MG tablet Take 2 mg by mouth at bedtime.    Marland Kitchen warfarin (COUMADIN) 5 MG tablet Take 5 mg by mouth at bedtime.      Review of Systems: GENERAL:  Feels better.  Working with PT.  No fevers, sweats or weight loss. PERFORMANCE STATUS (ECOG):  1 Lungs: No shortness of breath or cough.  No hemoptysis. Cardiac:  No chest pain, palpitations, orthopnea, or PND. GI:  No nausea, vomiting, diarrhea, constipation, melena or hematochezia. Musculoskeletal:  No back pain.  No joint pain.  No muscle tenderness. Extremities:  No pain or swelling. Skin:  No rashes or skin changes. Psych:  No mood changes, depression or anxiety. Pain:  No focal pain. Review of systems:  All other systems reviewed and found to be negative.  Physical Exam: Blood pressure 122/70, pulse 74, temperature 98.2 F (36.8 C), temperature source Oral, resp. rate 20, height 5' 5"  (1.651 m), weight 161 lb 2.5 oz (73.1 kg), SpO2 99 %.  GENERAL: Thin elderly gentleman sitting comfortably in bed on the medical unit.  MENTAL STATUS: Alert and oriented to person, place and time. HEAD: Short graying hair. Mustache. Temporal wasting. Normocephalic, atraumatic, face symmetric, no Cushingoid features. EYES: Brown  eyes. Right pupil reactive to light. Keeps left eye closed secondary to sensitivity to light (chronic). No conjunctivitis or scleral icterus. ENT: Oropharynx clear without lesion. Tongue normal. Mucous membranes moist.  RESPIRATORY: Clear to auscultation without rales, wheezes or rhonchi. CARDIOVASCULAR: Regular rate and rhythm without murmur, rub or gallop. ABDOMEN: Soft, non-tender, with active bowel sounds, and no hepatosplenomegaly. No masses. SKIN: No rashes, ulcers or lesions. EXTREMITIES: No edema, no skin discoloration or tenderness. No palpable cords. NEUROLOGICAL: Unremarkable. PSYCH: Appropriate  Results for orders placed or performed during the hospital encounter of 10/18/14 (from the past 48 hour(s))  Glucose, capillary     Status: Abnormal   Collection Time: 10/20/14  4:27 PM  Result Value Ref Range   Glucose-Capillary 189 (H) 70 - 99 mg/dL   Comment 1 Notify RN   Glucose, capillary     Status: Abnormal   Collection Time: 10/20/14 10:04 PM  Result Value Ref Range   Glucose-Capillary 148 (H) 70 - 99 mg/dL   Comment 1 Notify RN   CBC     Status: Abnormal   Collection Time: 10/21/14  4:21 AM  Result Value Ref Range   WBC 3.7 (L) 3.8 - 10.6 K/uL   RBC 2.78 (L) 4.40 - 5.90 MIL/uL   Hemoglobin 8.5 (L) 13.0 - 18.0 g/dL   HCT 24.5 (L) 40.0 - 52.0 %   MCV 88.0 80.0 - 100.0 fL   MCH 30.6 26.0 - 34.0 pg   MCHC 34.7 32.0 - 36.0 g/dL   RDW 18.8 (H) 11.5 - 14.5 %   Platelets 27 (LL) 150 - 440 K/uL    Comment: CRITICAL VALUE NOTED.  VALUE IS CONSISTENT WITH PREVIOUSLY REPORTED AND CALLED VALUE. PLATELET COUNT CONFIRMED BY SMEAR   Comprehensive metabolic panel     Status: Abnormal   Collection Time: 10/21/14  4:21 AM  Result Value Ref Range   Sodium 135 135 - 145 mmol/L   Potassium 3.9 3.5 - 5.1 mmol/L   Chloride 113 (H) 101 - 111 mmol/L   CO2 17 (L) 22 - 32 mmol/L   Glucose, Bld 118 (H) 65 - 99 mg/dL   BUN 21 (H) 6 - 20 mg/dL   Creatinine, Ser 1.68 (H) 0.61 -  1.24 mg/dL   Calcium 8.0 (L) 8.9 - 10.3 mg/dL   Total Protein 10.6 (H) 6.5 - 8.1 g/dL   Albumin 2.6 (L) 3.5 - 5.0 g/dL   AST 17 15 - 41 U/L   ALT 12 (L) 17 - 63 U/L   Alkaline Phosphatase 45 38 - 126 U/L   Total Bilirubin 1.0 0.3 - 1.2 mg/dL   GFR calc non Af Amer 38 (L) >60 mL/min   GFR calc Af Amer 44 (L) >60 mL/min    Comment: (NOTE) The eGFR has been  calculated using the CKD EPI equation. This calculation has not been validated in all clinical situations. eGFR's persistently <60 mL/min signify possible Chronic Kidney Disease.    Anion gap 5 5 - 15  Protime-INR     Status: Abnormal   Collection Time: 10/21/14  4:21 AM  Result Value Ref Range   Prothrombin Time 19.7 (H) 11.4 - 15.0 seconds   INR 1.65   Glucose, capillary     Status: Abnormal   Collection Time: 10/21/14  7:40 AM  Result Value Ref Range   Glucose-Capillary 113 (H) 70 - 99 mg/dL  Glucose, capillary     Status: Abnormal   Collection Time: 10/21/14 11:26 AM  Result Value Ref Range   Glucose-Capillary 215 (H) 70 - 99 mg/dL   Comment 1 Notify RN   Glucose, capillary     Status: Abnormal   Collection Time: 10/21/14  4:31 PM  Result Value Ref Range   Glucose-Capillary 197 (H) 70 - 99 mg/dL   Comment 1 Notify RN   Glucose, capillary     Status: Abnormal   Collection Time: 10/21/14  9:35 PM  Result Value Ref Range   Glucose-Capillary 264 (H) 70 - 99 mg/dL  Glucose, capillary     Status: Abnormal   Collection Time: 10/22/14  1:58 AM  Result Value Ref Range   Glucose-Capillary 207 (H) 70 - 99 mg/dL  CBC with Differential/Platelet     Status: Abnormal   Collection Time: 10/22/14  4:18 AM  Result Value Ref Range   WBC 3.7 (L) 3.8 - 10.6 K/uL   RBC 2.60 (L) 4.40 - 5.90 MIL/uL   Hemoglobin 7.9 (L) 13.0 - 18.0 g/dL   HCT 23.1 (L) 40.0 - 52.0 %   MCV 88.6 80.0 - 100.0 fL   MCH 30.3 26.0 - 34.0 pg   MCHC 34.2 32.0 - 36.0 g/dL   RDW 18.7 (H) 11.5 - 14.5 %   Platelets 29 (LL) 150 - 440 K/uL    Comment: CRITICAL  RESULT CALLED TO, READ BACK BY AND VERIFIED WITH: Rod Can AT 0545 ON 10/22/14. TSH    Neutrophils Relative % 52 %   Lymphocytes Relative 34 %   Monocytes Relative 10 %   Eosinophils Relative 4 %   Basophils Relative 0 %   Neutro Abs 1.9 1.4 - 6.5 K/uL   Lymphs Abs 1.3 1.0 - 3.6 K/uL   Monocytes Absolute 0.4 0.2 - 1.0 K/uL   Eosinophils Absolute 0.1 0 - 0.7 K/uL   Basophils Absolute 0.0 0 - 0.1 K/uL  Basic metabolic panel     Status: Abnormal   Collection Time: 10/22/14  4:18 AM  Result Value Ref Range   Sodium 133 (L) 135 - 145 mmol/L   Potassium 3.5 3.5 - 5.1 mmol/L   Chloride 111 101 - 111 mmol/L   CO2 19 (L) 22 - 32 mmol/L   Glucose, Bld 189 (H) 65 - 99 mg/dL   BUN 23 (H) 6 - 20 mg/dL   Creatinine, Ser 1.65 (H) 0.61 - 1.24 mg/dL   Calcium 7.6 (L) 8.9 - 10.3 mg/dL   GFR calc non Af Amer 39 (L) >60 mL/min   GFR calc Af Amer 45 (L) >60 mL/min    Comment: (NOTE) The eGFR has been calculated using the CKD EPI equation. This calculation has not been validated in all clinical situations. eGFR's persistently <60 mL/min signify possible Chronic Kidney Disease.    Anion gap 3 (L) 5 - 15  Glucose,  capillary     Status: Abnormal   Collection Time: 10/22/14  7:52 AM  Result Value Ref Range   Glucose-Capillary 159 (H) 70 - 99 mg/dL  Glucose, capillary     Status: Abnormal   Collection Time: 10/22/14 12:34 PM  Result Value Ref Range   Glucose-Capillary 248 (H) 70 - 99 mg/dL   No results found.  Assessment:  Peter Walters is a 79 y.o. male with progressive stage III multiple myeloma on third line therapy. 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 He was admitted on 10/18/2014 with progressive weakness likely due to dehydration and pancytopenia secondary to progressive marrow involvement.   Plan: 1. Anemia: patient received 2 units PRBC this past weekend with improvement of his hemoglobin. He does not require additional transfusion at this  time. 2. Thrombocytopenia: Platelet count still significantly decreased to 27, but stable. Mild nosebleed on Monday likely secondary to Coumadin still on board. No transfusion is needed at this time. Monitor.  3. Multiple myeloma: Treatment on hold temporarily. Discussed at length with patient and his wife that further treatments may not be possible given his severe thrombocytopenia. Plan to readdress treatments in 1 week upon discharge. 4. Elevated creatinine: Trending down. Appreciate nephrology input. 5. Cardiology: Patient in rapid A. fib overnight and transferred to the intensive care unit. Currently on Cardizem drip. Appreciate cardiology assistance. 6. Disposition: OK to discharge from a multiple myeloma standpoint. Will await approval from cardiology prior to sending home.   Lloyd Huger, MD 10/22/2014, 3:52 PM

## 2014-10-22 NOTE — Progress Notes (Signed)
   10/22/14 1100  Clinical Encounter Type  Visited With Family  Visit Type Initial;Spiritual support  Referral From Chaplain  Spiritual Encounters  Spiritual Needs Emotional  Stress Factors  Patient Stress Factors None identified  Family Stress Factors None identified  Chaplain rounded in unit and went in to see how we could support patient and family. Chaplain Niaomi Cartaya A. Cannon [place]inton Ext. [phone]

## 2014-10-22 NOTE — Progress Notes (Addendum)
Subjective:  Rapid a fib last night No N/V Good UOP 3020 cc S Cr improving   Objective:  Vital signs in last 24 hours:  Temp:  [98.2 F (36.8 C)-99 F (37.2 C)] 98.2 F (36.8 C) (05/10 0900) Pulse Rate:  [63-174] 69 (05/10 1000) Resp:  [16-24] 24 (05/10 1000) BP: (101-170)/(63-112) 101/75 mmHg (05/10 1000) SpO2:  [98 %-100 %] 98 % (05/10 1000) Weight:  [73.1 kg (161 lb 2.5 oz)] 73.1 kg (161 lb 2.5 oz) (05/10 0200)  Weight change:  Filed Weights   10/18/14 1559 10/20/14 0501 10/22/14 0200  Weight: 72.122 kg (159 lb) 76.613 kg (168 lb 14.4 oz) 73.1 kg (161 lb 2.5 oz)    Intake/Output: I/O last 3 completed shifts: In: 1162.5 [P.O.:480; I.V.:682.5] Out: 3020 [OEHOZ:2248]   Intake/Output this shift:  Total I/O In: 620 [P.O.:360; I.V.:260] Out: -   Physical Exam: General: NAD,   Head: Normocephalic, atraumatic. Moist oral mucosal membranes  Eyes: Anicteric,  Neck: Supple, trachea midline  Lungs:  Clear to auscultation  Heart: A Fib, tachycardic  Abdomen:  Soft, nontender,   Extremities:  peripheral edema.  Neurologic: Nonfocal, moving all four extremities  Skin: No acute lesions       Basic Metabolic Panel:  Recent Labs Lab 10/18/14 1107 10/19/14 0436 10/20/14 0435 10/21/14 0421 10/22/14 0418  NA 130* 134* 132* 135 133*  K 4.7 4.6 4.0 3.9 3.5  CL 108 113* 114* 113* 111  CO2 18* 18* 16* 17* 19*  GLUCOSE 161* 133* 142* 118* 189*  BUN 44* 34* 27* 21* 23*  CREATININE 2.77* 2.23* 1.85* 1.68* 1.65*  CALCIUM 8.4* 7.6* 7.7* 8.0* 7.6*  MG 2.1  --   --   --   --   PHOS  --   --  1.6*  --   --     Liver Function Tests:  Recent Labs Lab 10/18/14 1107 10/19/14 0436 10/21/14 0421  AST 19 17 17   ALT 14* 12* 12*  ALKPHOS 53 41 45  BILITOT 0.7 0.8 1.0  PROT >12.0* 10.5* 10.6*  ALBUMIN 3.1* 2.6* 2.6*   No results for input(s): LIPASE, AMYLASE in the last 168 hours. No results for input(s): AMMONIA in the last 168 hours.  CBC:  Recent Labs Lab  10/18/14 1107 10/19/14 0436 10/20/14 0435 10/21/14 0421 10/22/14 0418  WBC 3.7* 2.4* 3.4* 3.7* 3.7*  NEUTROABS 1.5  --  1.8  --  1.9  HGB 7.4* 6.2* 8.5* 8.5* 7.9*  HCT 21.7* 18.4* 24.0* 24.5* 23.1*  MCV 91.5 91.6 88.3 88.0 88.6  PLT 32* 26* 28* 27* 29*    Cardiac Enzymes: No results for input(s): CKTOTAL, CKMB, CKMBINDEX, TROPONINI in the last 168 hours.  BNP: Invalid input(s): POCBNP  CBG:  Recent Labs Lab 10/21/14 1126 10/21/14 1631 10/21/14 2135 10/22/14 0158 10/22/14 0752  GLUCAP 215* 197* 264* 207* 159*    Microbiology: Results for orders placed or performed during the hospital encounter of 09/18/14  Stool culture     Status: None   Collection Time: 09/18/14 11:07 PM  Result Value Ref Range Status   Micro Text Report   Final       COMMENT                   NO SALMONELLA OR SHIGELLA ISOLATED   COMMENT                   NO PATHOGENIC E.COLI DETECTED   COMMENT  NO CAMPYLOBACTER ANTIGEN DETECTED   ANTIBIOTIC                                                      Clostridium Difficile [facility])     Status: None   Collection Time: 09/19/14  4:06 AM  Result Value Ref Range Status   Micro Text Report   Final       C.DIFFICILE ANTIGEN       C.DIFFICILE GDH ANTIGEN : NEGATIVE   C.DIFFICILE TOXIN A/B     C.DIFFICILE TOXINS A AND B : NEGATIVE   INTERPRETATION            Negative for C. difficile.    ANTIBIOTIC                                                        Coagulation Studies:  Recent Labs  10/20/14 0435 10/21/14 0421  LABPROT 21.5* 19.7*  INR 1.85 1.65    Urinalysis: No results for input(s): COLORURINE, LABSPEC, PHURINE, GLUCOSEU, HGBUR, BILIRUBINUR, KETONESUR, PROTEINUR, UROBILINOGEN, NITRITE, LEUKOCYTESUR in the last 72 hours.  Invalid input(s): APPERANCEUR    Imaging: No results found.   Medications:   . sodium chloride 50 mL/hr at 10/22/14 0725  . diltiazem (CARDIZEM) infusion 15 mg/hr (10/22/14 0724)   .  atorvastatin  40 mg Oral QHS  . calcium-vitamin D  1 tablet Oral Q breakfast  . diltiazem  240 mg Oral Daily  . diltiazem  10 mg Intravenous Once  . diltiazem  60 mg Oral 3 times per day  . doxazosin  2 mg Oral QHS  . feeding supplement (GLUCERNA SHAKE)  237 mL Oral BID WC  . insulin aspart  0-9 Units Subcutaneous TID WC  . lidocaine-prilocaine  1 application Topical Once  . loratadine  10 mg Oral Daily  . megestrol  40 mg Oral Daily  . metoprolol tartrate  25 mg Oral 4 times per day  . pantoprazole  40 mg Oral QAC breakfast   acetaminophen, alum & mag hydroxide-simeth, [DISCONTINUED] ondansetron **OR** [DISCONTINUED] ondansetron **OR** ondansetron (ZOFRAN) IV **OR** [DISCONTINUED] ondansetron (ZOFRAN) IV, oxyCODONE, senna-docusate  Assessment/ Plan:  Peter Walters is a 79 y.o.black male with multiple myeloma, atrial fibrillation, hypertension, hyperlipidmia, coronary artery disease status post stent, RIJ port, anemia, history of peptic ulcer disease, GERD, diabetes mellitus type II, BPH and allergic rhinitis.   Patient was admitted to [facility] on 10/18/2014 with progressive weakness.   1. Acute renal failure on chronic kidney disease stage III with hyponatremia, hyperkalemia and proteinuria: N17.9, N18.3, E87.1, E87.5, R80.9 - holding enalapril due to acute renal failure and hyperkalemia - mail issue at this time is to maintain hemodynamic stability in setting of A Fib/ RVR  2. Acute pancytopenia: leukocytopenia, anemia and thrombocytopenia - PRBC transfusion 5/7.   3. A Fib with RVR.  - currently on cardizem drip    LOS: 4 Peter Walters 5/10/201611:04 AM

## 2014-10-22 NOTE — Progress Notes (Signed)
PT Cancellation Note  Patient Details Name: Peter Walters MRN: [id number] DOB: [birth year]   Cancelled Treatment:    Reason Eval/Treat Not Completed: Medical issues which prohibited therapy (patient transferred to CCU overnight due to change in medical status. Per hospital policy, will require new orders to resume PT services.  Initial order completed; please re-consult as medically appropriate.)  Mollie Rossano H. Owens Shark, PT, DPT 10/22/2014, 8:30 AM [phone]

## 2014-10-22 NOTE — Progress Notes (Signed)
Alert talking with wife. Denies pain. VSS. Afib per cardiac monitor, rate controlled and off drip since 1520 today. 2A orders in for transfer.  Peter Walters

## 2014-10-22 NOTE — Progress Notes (Signed)
[facility] Encounter Note  Patient: Peter Walters / Admit Date: 10/18/2014 / Date of Encounter: 10/22/2014, 8:07 AM   Subjective: Weak and fatigued with mild shortness of breath and now with atrial fibrillation with rapid ventricular rate  Review of Systems: Positive for: Fatigue and shortness of breath Negative for: Others previously listed  Objective: Telemetry: Atrial fibrillation with rapid ventricular rate Physical Exam: Blood pressure 122/84, pulse 63, temperature 98.5 F (36.9 C), temperature source Oral, resp. rate 21, height 5' 5"  (1.651 m), weight 161 lb 2.5 oz (73.1 kg), SpO2 100 %. Body mass index is 26.82 kg/(m^2). General: Well developed, well nourished, in no acute distress. Head: Normocephalic, atraumatic, sclera non-icteric, no xanthomas, nares are without discharge. Neck: No apparent masses Lungs: Normal respirations with few wheezes, no rhonchi, few Rales and/or crackles   Heart: Irregular rate and rhythm, normal S1-S2, no murmur, no rub, no gallop, PMI is normal size and placement, carotid upstroke normal without bruit, jugular venous pressure normal Abdomen: Soft, non-tender, non-distended with normoactive bowel sounds. No apparent hepatosplenomegaly. Abdominal aorta is normal size without bruit Extremities: No edema, no clubbing, no cyanosis, no ulcers,  Peripheral: 2+ radial, 2+ femoral, 2+ dorsal pedal pulses Neuro: Alert and oriented X 3. Moves all extremities spontaneously. Psych:  Responds to questions appropriately with a normal affect.   Intake/Output Summary (Last 24 hours) at 10/22/14 0807 Last data filed at 10/22/14 0725  Gross per 24 hour  Intake    545 ml  Output   2050 ml  Net  -1505 ml    Inpatient Medications:  . atorvastatin  40 mg Oral QHS  . calcium-vitamin D  1 tablet Oral Q breakfast  . diltiazem  240 mg Oral Daily  . diltiazem  10 mg Intravenous Once  . diltiazem  60 mg Oral 3 times per day  . doxazosin  2 mg  Oral QHS  . feeding supplement (GLUCERNA SHAKE)  237 mL Oral BID WC  . insulin aspart  0-9 Units Subcutaneous TID WC  . lidocaine-prilocaine  1 application Topical Once  . loratadine  10 mg Oral Daily  . megestrol  40 mg Oral Daily  . metoprolol tartrate  25 mg Oral 4 times per day  . pantoprazole  40 mg Oral QAC breakfast   Infusions:  . sodium chloride 50 mL/hr at 10/22/14 0725  . diltiazem (CARDIZEM) infusion 15 mg/hr (10/22/14 0724)    Labs:  Recent Labs  10/20/14 0435 10/21/14 0421 10/22/14 0418  NA 132* 135 133*  K 4.0 3.9 3.5  CL 114* 113* 111  CO2 16* 17* 19*  GLUCOSE 142* 118* 189*  BUN 27* 21* 23*  CREATININE 1.85* 1.68* 1.65*  CALCIUM 7.7* 8.0* 7.6*  PHOS 1.6*  --   --     Recent Labs  10/21/14 0421  AST 17  ALT 12*  ALKPHOS 45  BILITOT 1.0  PROT 10.6*  ALBUMIN 2.6*    Recent Labs  10/20/14 0435 10/21/14 0421 10/22/14 0418  WBC 3.4* 3.7* 3.7*  NEUTROABS 1.8  --  1.9  HGB 8.5* 8.5* 7.9*  HCT 24.0* 24.5* 23.1*  MCV 88.3 88.0 88.6  PLT 28* 27* 29*   No results for input(s): CKTOTAL, CKMB, TROPONINI in the last 72 hours. Invalid input(s): POCBNP No results for input(s): HGBA1C in the last 72 hours.   Weights: Filed Weights   10/18/14 1559 10/20/14 0501 10/22/14 0200  Weight: 159 lb (72.122 kg) 168 lb 14.4 oz (76.613 kg)  161 lb 2.5 oz (73.1 kg)     Radiology/Studies:  Ct Abdomen Pelvis Wo Contrast  10/13/2014   CLINICAL DATA:  Abdominal pain for 24 hr. Acute renal failure. History of progressive multiple myeloma last seen in Oncology 09/16/2014. Congestive heart failure. History of COPD, GERD.  EXAM: CT ABDOMEN AND PELVIS WITHOUT CONTRAST  TECHNIQUE: Multidetector CT imaging of the abdomen and pelvis was performed following the standard protocol without IV contrast.  COMPARISON:  03/11/2013  FINDINGS: Lower chest: Mild scarring at the left lung base. There are emphysematous changes in within the lung bases. Heart size is normal.  Upper abdomen:  There is a small amount of perihepatic fluid. Liver otherwise is normal in appearance. No focal abnormality identified within the spleen, pancreas, or adrenal glands. Kidneys have a normal appearance. No intrarenal or ureteral stones. Gallbladder is present.  Gastrointestinal tract: Marked thickening of numerous small bowel loops, associated with mesenteric fluid especially within the right lower quadrant. There is no associated obstruction. There are skip areas of normal appearing small bowel, suggesting involvement by Crohn's disease. The stomach has a normal appearance. There are colonic diverticula. Although there is a small amount of fluid around the colon there is no evidence for inflammation arising from the colon. Appendix is normal.  There is hazy density within the mesentery, associated with small but numerous lymph nodes as seen on prior study. Postoperative clips are identified within the mesenteric.  Pelvis: Free pelvic fluid is present. The urinary bladder has a normal appearance. Prostate and seminal vesicles are normal in appearance. No pelvic adenopathy.  Retroperitoneum: Small, nonspecific retroperitoneal lymph nodes.  Abdominal wall: Unremarkable.  Osseous structures: Degenerative changes are seen in the lower spine. No suspicious lytic or blastic lesions identified.  IMPRESSION: 1. Significant abnormality of multiple small bowel loops, associated with significant mesenteric fluid especially involving the right lower quadrant loops. Considerations include amyloidosis, Crohn's disease, infectious or inflammatory etiologies, lymphoma, vasculitis. 2. Colonic diverticulosis.  No diverticulitis. 3. Small amount of pelvic ascites and perihepatic fluid. 4. Hazy density within the mesentery associated with small, numerous lymph nodes. Findings are possibly inflammatory and appear relatively stable. 5. No abscess.   Electronically Signed   By: Nolon Nations M.D.   On: 10/13/2014 17:10   US  Renal  10/19/2014   CLINICAL DATA:  Acute renal failure, history atrial fibrillation, hypertension, multiple myeloma, coronary artery disease, CHF, diabetes  EXAM: RENAL / URINARY TRACT ULTRASOUND COMPLETE  COMPARISON:  CT abdomen and pelvis 10/13/2014  FINDINGS: Right Kidney:  Length: 10.7 cm. Normal cortical thickness. Upper normal cortical echogenicity. No mass, hydronephrosis or shadowing calcification. No perinephric fluid.  Left Kidney:  Length: 10.6 cm. Normal cortical thickness with upper normal cortical echogenicity. No mass, hydronephrosis or shadowing calcification. No perinephric fluid.  Bladder:  Normal appearance.  IMPRESSION: No renal sonographic abnormalities identified.   Electronically Signed   By: Lavonia Dana M.D.   On: 10/19/2014 15:43     Assessment and Recommendation  79 y.o. male with known paroxysmal nonvalvular atrial fibrillation essential hypertension coronary artery disease status post history of PCI and stent placement having an acute renal failure and malignant hypertension now with recurrence of atrial fibrillation with rapid ventricular rate needing further medication management 1. Continue metoprolol for heart rate control and maintenance of normal sinus rhythm and add oral Cardizem CD for better heart rate control and possible discontinuation of intravenous diltiazem as able  2. No change in current medical regimen for hypertension control 3.  Avoid ACE inhibitor due to acute renal failure 4. No further intervention of coronary artery disease currently stable without evidence of symptoms and/or myocardial infarction 5. Discontinuation and avoid Tikosyn due to acute renal failure and concerns of side effects of the medication 6. Discontinuation and avoid anticoagulation due to low platelets   7. Continue supportive care of concerns of multiple myeloma and acute renal failure 8. No further cardiac diagnostics necessary at this time 9. Consideration of amiodarone if  necessary if patient we'll need for maintenance of normal sinus rhythm after treatment of above  Signed, Serafina Royals M.D. FACC

## 2014-10-22 NOTE — Progress Notes (Signed)
Inpatient Diabetes Program Recommendations  AACE/ADA: New Consensus Statement on Inpatient Glycemic Control (2013)  Target Ranges:  Prepandial:   less than 140 mg/dL      Peak postprandial:   less than 180 mg/dL (1-2 hours)      Critically ill patients:  140 - 180 mg/dL   Inpatient Diabetes Program Recommendations Insulin - Basal: consider adding Lantus 10 units Thank you  Raoul Pitch BSN, RN,CDE Inpatient Diabetes Coordinator [phone] (team pager)

## 2014-10-22 NOTE — Consult Note (Signed)
[facility] [place] follow up   PATIENT NAME: Peter Walters    MR#:  [id number]  DATE OF BIRTH:  [birth year]  DATE OF ADMISSION:  10/18/2014  PRIMARY CARE PHYSICIAN: Petra Kuba, MD   REQUESTING/REFERRING PHYSICIAN: Nolon Stalls, M.D  CHIEF COMPLAINT:  Peter Walters  is a 79 y.o. male with a known history of Recurring Multiple myeloma on chemo currently, Afib on coumadin, HTN, BPH, CAD s/p PCI, DM, GERD admitted for generalised weakness for 2-3 weeks now but worse to the point that he was not able to even get up out of bed prior to admission.  Afib with RVR overnight. Now on cardizem gtt in the CCU, rate controlled. Asymptomatic.   PAST MEDICAL HISTORY:   Past Medical History  Diagnosis Date  . A-fib   . Hypertension   . Benign prostatic hypertrophy   . History of gastrointestinal bleeding   . Hyperlipidemia   . History of multiple myeloma   . CAD (coronary artery disease)   . GERD (gastroesophageal reflux disease)   . CHF (congestive heart failure)   . Peptic ulcer disease   . Hyponatremia   . Multiple myeloma in relapse 10/17/2014  . Cancer     multiple myeloma  . Diabetes mellitus without complication   . 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 Cardiac catheterization    . Coronary angioplasty      [facility]  . Eye surgery      SOCIAL HISTORY:   History  Substance Use Topics  . Smoking status: Never Smoker   . Smokeless tobacco: Not on file  . Alcohol Use: No    FAMILY HISTORY:   Family History  Problem Relation Age of Onset  . Heart disease Father   . Kidney failure Brother     DRUG ALLERGIES:   Allergies  Allergen Reactions  . Aspirin Hives and Other (See Comments)    Reaction:  Causes pt to bleed.   . Celecoxib Other (See Comments)    Reaction:  Causes pt to bleed.      REVIEW OF SYSTEMS:  CONSTITUTIONAL: No fever, Positive for  Fatigue and weakness.  RESPIRATORY: No cough, shortness of breath, wheezing or hemoptysis.  CARDIOVASCULAR:+ chest pain with exertion, orthopnea, edema.  GASTROINTESTINAL: No nausea, vomiting, diarrhea or abdominal pain.  GENITOURINARY: No dysuria, hematuria.  ENDOCRINE: No polyuria, nocturia,  HEMATOLOGY: No anemia, easy bruising or bleeding SKIN: No rash or lesion. MUSCULOSKELETAL: No joint pain or arthritis.   NEUROLOGIC: Positive for tingling and numbness for both legs and both hands since his chemo.  PSYCHIATRY: No anxiety or depression.   MEDICATIONS AT HOME:   Prior to Admission medications   Medication Sig Start Date End Date Taking? Authorizing Provider  atorvastatin (LIPITOR) 40 MG tablet Take 40 mg by mouth at bedtime.   Yes Historical Provider, MD  doxazosin (CARDURA) 2 MG tablet Take 2 mg by mouth at bedtime.   Yes Historical Provider, MD  glipiZIDE (GLUCOTROL XL) 10 MG 24 hr tablet Take 10 mg by mouth 2 (two) times daily.   Yes Historical Provider, MD  magnesium oxide (MAG-OX) 400 MG tablet Take 400 mg by mouth 2 (two) times daily.   Yes Historical Provider, MD  metoprolol tartrate (LOPRESSOR) 25 MG tablet Take 12.5 mg by mouth 2 (two) times daily.  Yes Historical Provider, MD  acetaminophen (TYLENOL) 325 MG tablet Take 650 mg by mouth every 4 (four) hours as needed.    Historical Provider, MD  cetirizine (ZYRTEC) 10 MG tablet Take 10 mg by mouth daily.    Historical Provider, MD  dicyclomine (BENTYL) 20 MG tablet Take 1 tablet (20 mg total) by mouth every 6 (six) hours. As needed for abdominal pain Patient not taking: Reported on 10/18/2014 10/13/14   Boris Lown, DO  dofetilide (TIKOSYN) 500 MCG capsule Take 500 mcg by mouth every 12 (twelve) hours.     Historical Provider, MD  enalapril (VASOTEC) 20 MG tablet Take 20 mg by mouth 2 (two) times daily.     Historical Provider, MD  insulin detemir (LEVEMIR)  100 UNIT/ML injection Inject 15 Units into the skin at bedtime.    Historical Provider, MD  lidocaine-prilocaine (EMLA) cream Apply 1 application topically once.    Historical Provider, MD  loperamide (IMODIUM A-D) 2 MG tablet Take 1 tablet (2 mg total) by mouth 4 (four) times daily as needed for diarrhea or loose stools. 10/16/14   Sheryl Precious Haws, DO  megestrol (MEGACE) 40 MG/ML suspension Take 40 mg by mouth daily.    Historical Provider, MD  ondansetron (ZOFRAN-ODT) 8 MG disintegrating tablet Take 1 tablet (8 mg total) by mouth every 8 (eight) hours as needed for nausea. Patient not taking: Reported on 10/18/2014 10/13/14   Boris Lown, DO  pantoprazole (PROTONIX) 40 MG tablet Take 40 mg by mouth daily.    Historical Provider, MD  ranitidine (ZANTAC) 150 MG tablet Take 150 mg by mouth 2 (two) times daily.    Historical Provider, MD  warfarin (COUMADIN) 1 MG tablet Take 2 mg by mouth at bedtime.    Historical Provider, MD  warfarin (COUMADIN) 5 MG tablet Take 5 mg by mouth at bedtime.    Historical Provider, MD      VITAL SIGNS:  Blood pressure 118/71, pulse 73, temperature 98.2 F (36.8 C), temperature source Oral, resp. rate 21, height $RemoveBe'5\' 5"'Fjpnkdksg$  (1.651 m), weight 73.1 kg (161 lb 2.5 oz), SpO2 99 %.  PHYSICAL EXAMINATION:  GENERAL:  79 y.o.-year-old patient lying in the bed with no acute distress. Thin EYES: Pupils post surgical, equal, round, reactive to light and accommodation. No scleral icterus. Extraocular muscles intact.  HEENT: Head atraumatic, normocephalic. Oropharynx and nasopharynx clear.  NECK:  Supple, no jugular venous distention. No thyroid enlargement, no tenderness.  LUNGS: Normal breath sounds bilaterally, no wheezing, rales,rhonchi or crepitation. No use of accessory muscles of respiration.  CARDIOVASCULAR: S1, S2 normal. 3/6 systolic murmur present, No rubs, or gallops.  ABDOMEN: Soft, nontender, nondistended. Bowel sounds present. No organomegaly or mass.  EXTREMITIES:  No pedal edema, cyanosis, or clubbing.  NEUROLOGIC: Cranial nerves II through XII are intact. Muscle strength 5/5 in all extremities. Sensation intact. Gait not checked. Generalized weakness present PSYCHIATRIC: The patient is alert and oriented x 3.  SKIN: No obvious rash, lesion, or ulcer.   LABORATORY PANEL:   CBC  Recent Labs Lab 10/22/14 0418  WBC 3.7*  HGB 7.9*  HCT 23.1*  PLT 29*   ------------------------------------------------------------------------------------------------------------------  Chemistries   Recent Labs Lab 10/18/14 1107  10/21/14 0421 10/22/14 0418  NA 130*  < > 135 133*  K 4.7  < > 3.9 3.5  CL 108  < > 113* 111  CO2 18*  < > 17* 19*  GLUCOSE 161*  < > 118* 189*  BUN 44*  < >  21* 23*  CREATININE 2.77*  < > 1.68* 1.65*  CALCIUM 8.4*  < > 8.0* 7.6*  MG 2.1  --   --   --   AST 19  < > 17  --   ALT 14*  < > 12*  --   ALKPHOS 53  < > 45  --   BILITOT 0.7  < > 1.0  --   < > = values in this interval not displayed. ------------------------------------------------------------------------------------------------------------------  Cardiac Enzymes No results for input(s): TROPONINI in the last 168 hours. ------------------------------------------------------------------------------------------------------------------  RADIOLOGY:  No results found.  EKG:   Orders placed or performed during the hospital encounter of 10/18/14  . EKG 12-Lead  . EKG 12-Lead    IMPRESSION AND PLAN:   78y/oM with multiple myeloma on chemo, HTN, DM, CAD s/p PCI, Afib on coumadin, BPH, GERD admitted for generalised weakness and noted to have acute on chronic renal failure and anemia. Medical consult requested for Medical management  1. Acute anemia- secondary to multiple myeloma hgb improved since transfusion, now up to 8.5, trending downwards Mgmt per oncology, Transfuse for hb <7 Hold coumadin- likely not a good candidate for warfarin with significant  anemia  2. Acute on chronic renal failure- baseline cr of 2.0, at 2.7 on adm: improved and back to baseline Prerenal likely and also multiple myeloma Decrease IVF Nephrology consulted Hold ACEI  3. Throbocytopenia- stable chronic, monitor, Tx plts if plt count <20k  4. CAD s/p PCI- stable, does have CP with exertion cardiology following, on  metoprolol and statin. No asa due to anemia and thrombocytopenia  5. DM- controlled Hold glipizide while inpatient Continue ssi  6. Multiple Myeloma- Progressive multiple myeloma, on third line therapy. Overall poor prognosis. Mgmt per oncology team Agree with palliative care consult  7. Afib- Tikosyn held due to renal failure and prolonged QT, Adjust elecrtolytes Episode of RVR overnight. Off Cardizem drip for past 3 hours. Added oral Cardizem  cardiology following  8. DVT prophylaxis- TEDs, SCDs  9. Deconditioning - will need HHPT and rolling walker   All the records are reviewed and case discussed with Consulting provider. Management plans discussed with the patient, family and they are in agreement.  CODE STATUS: Full Code  TOTAL TIME TAKING CARE OF THIS PATIENT: 25 minutes.    Myrtis Ser M.D on 10/22/2014 at 4:58 PM  Between 7am to 6pm - Pager - [phone]  After 6pm go to [email or web address] - password [email or web address]  Office  [phone]  CC: Primary care Physician: Petra Kuba, MD

## 2014-10-22 NOTE — Progress Notes (Signed)
Patient transferred to ICU-15 due to continued A-fib with RVR. IV Cardizem administered X2, total 20mg , no improvement. Also, Metoprolol 25mg  was given after patient transferred to 2A from 1C. Patient BP remained stable. Wife at bedside. Personal belongings sent with patient upon transfer.

## 2014-10-23 LAB — TYPE AND SCREEN
ABO/RH(D): A POS
ANTIBODY SCREEN: NEGATIVE
Unit division: 0
Unit division: 0

## 2014-10-23 LAB — GLUCOSE, CAPILLARY
GLUCOSE-CAPILLARY: 168 mg/dL — AB (ref 70–99)
GLUCOSE-CAPILLARY: 200 mg/dL — AB (ref 70–99)
Glucose-Capillary: 193 mg/dL — ABNORMAL HIGH (ref 70–99)
Glucose-Capillary: 248 mg/dL — ABNORMAL HIGH (ref 70–99)

## 2014-10-23 NOTE — Care Management (Signed)
Received from ICU.  Patient with multiple myeloma and required transfer to ICU from 1C for atrial fib requiring cardizem drip.  Previous care manager reports that anticipate discharge home with home health services. Agency preference is [facility].  There is a need for new physical therapy consult order as it was cancelled when moved to ICU

## 2014-10-23 NOTE — Progress Notes (Signed)
Subjective:  UOP fair HR controlled Feels well. No acute c/o   Objective:  Vital signs in last 24 hours:  Temp:  [98.5 F (36.9 C)-99.4 F (37.4 C)] 98.5 F (36.9 C) (05/11 1128) Pulse Rate:  [62-86] 86 (05/11 1128) Resp:  [18-28] 18 (05/11 1128) BP: (128-152)/(67-75) 152/75 mmHg (05/11 1128) SpO2:  [100 %] 100 % (05/11 1128) Weight:  [57.335 kg (126 lb 6.4 oz)-75.66 kg (166 lb 12.8 oz)] 57.335 kg (126 lb 6.4 oz) (05/11 0418)  Weight change: 2.56 kg (5 lb 10.3 oz) Filed Weights   10/22/14 0200 10/22/14 2133 10/23/14 0418  Weight: 73.1 kg (161 lb 2.5 oz) 75.66 kg (166 lb 12.8 oz) 57.335 kg (126 lb 6.4 oz)    Intake/Output: I/O last 3 completed shifts: In: 1400 [P.O.:600; I.V.:800] Out: 2795 [Urine:2795]   Intake/Output this shift:  Total I/O In: 760 [P.O.:760] Out: 900 [Urine:900]  Physical Exam: General: NAD,   Head: Normocephalic, atraumatic. Moist oral mucosal membranes  Eyes: Anicteric,  Neck: Supple, trachea midline  Lungs:  Clear to auscultation  Heart: A Fib, rate controlled  Abdomen:  Soft, nontender,   Extremities:  peripheral edema.  Neurologic: Nonfocal, moving all four extremities  Skin: No acute lesions       Basic Metabolic Panel:  Recent Labs Lab 10/18/14 1107 10/19/14 0436 10/20/14 0435 10/21/14 0421 10/22/14 0418  NA 130* 134* 132* 135 133*  K 4.7 4.6 4.0 3.9 3.5  CL 108 113* 114* 113* 111  CO2 18* 18* 16* 17* 19*  GLUCOSE 161* 133* 142* 118* 189*  BUN 44* 34* 27* 21* 23*  CREATININE 2.77* 2.23* 1.85* 1.68* 1.65*  CALCIUM 8.4* 7.6* 7.7* 8.0* 7.6*  MG 2.1  --   --   --   --   PHOS  --   --  1.6*  --   --     Liver Function Tests:  Recent Labs Lab 10/18/14 1107 10/19/14 0436 10/21/14 0421  AST _0 ALT 14* 12* 12*  ALKPHOS 53 41 45  BILITOT 0.7 0.8 1.0  PROT >12.0* 10.5* 10.6*  ALBUMIN 3.1* 2.6* 2.6*   No results for input(s): LIPASE, AMYLASE in the last 168 hours. No results for input(s): AMMONIA in the last 168  hours.  CBC:  Recent Labs Lab 10/18/14 1107 10/19/14 0436 10/20/14 0435 10/21/14 0421 10/22/14 0418  WBC 3.7* 2.4* 3.4* 3.7* 3.7*  NEUTROABS 1.5  --  1.8  --  1.9  HGB 7.4* 6.2* 8.5* 8.5* 7.9*  HCT 21.7* 18.4* 24.0* 24.5* 23.1*  MCV 91.5 91.6 88.3 88.0 88.6  PLT 32* 26* 28* 27* 29*    Cardiac Enzymes: No results for input(s): CKTOTAL, CKMB, CKMBINDEX, TROPONINI in the last 168 hours.  BNP: Invalid input(s): POCBNP  CBG:  Recent Labs Lab 10/22/14 1234 10/22/14 1654 10/22/14 2140 10/23/14 0720 10/23/14 1124  GLUCAP 248* 257* 254* 168* 193*    Microbiology: Results for orders placed or performed during the hospital encounter of 09/18/14  Stool culture     Status: None   Collection Time: 09/18/14 11:07 PM  Result Value Ref Range Status   Micro Text Report   Final       COMMENT                   NO SALMONELLA OR SHIGELLA ISOLATED   COMMENT                   NO PATHOGENIC E.COLI DETECTED  COMMENT                   NO CAMPYLOBACTER ANTIGEN DETECTED   ANTIBIOTIC                                                      Clostridium Difficile [facility])     Status: None   Collection Time: 09/19/14  4:06 AM  Result Value Ref Range Status   Micro Text Report   Final       C.DIFFICILE ANTIGEN       C.DIFFICILE GDH ANTIGEN : NEGATIVE   C.DIFFICILE TOXIN A/B     C.DIFFICILE TOXINS A AND B : NEGATIVE   INTERPRETATION            Negative for C. difficile.    ANTIBIOTIC                                                        Coagulation Studies:  Recent Labs  10/21/14 0421  LABPROT 19.7*  INR 1.65    Urinalysis: No results for input(s): COLORURINE, LABSPEC, PHURINE, GLUCOSEU, HGBUR, BILIRUBINUR, KETONESUR, PROTEINUR, UROBILINOGEN, NITRITE, LEUKOCYTESUR in the last 72 hours.  Invalid input(s): APPERANCEUR    Imaging: No results found.   Medications:   . sodium chloride 50 mL/hr at 10/22/14 0725   . atorvastatin  40 mg Oral QHS  . calcium-vitamin D  1  tablet Oral Q breakfast  . diltiazem  240 mg Oral Daily  . doxazosin  2 mg Oral QHS  . feeding supplement (GLUCERNA SHAKE)  237 mL Oral BID WC  . insulin aspart  0-9 Units Subcutaneous TID WC  . lidocaine-prilocaine  1 application Topical Once  . loratadine  10 mg Oral Daily  . megestrol  40 mg Oral Daily  . metoprolol tartrate  25 mg Oral 4 times per day  . pantoprazole  40 mg Oral QAC breakfast   acetaminophen, alum & mag hydroxide-simeth, [DISCONTINUED] ondansetron **OR** [DISCONTINUED] ondansetron **OR** ondansetron (ZOFRAN) IV **OR** [DISCONTINUED] ondansetron (ZOFRAN) IV, oxyCODONE, senna-docusate  Assessment/ Plan:  Peter Walters is a 79 y.o.black male with multiple myeloma, atrial fibrillation, hypertension, hyperlipidmia, coronary artery disease status post stent, RIJ port, anemia, history of peptic ulcer disease, GERD, diabetes mellitus type II, BPH and allergic rhinitis.   Patient was admitted to [facility] on 10/18/2014 with progressive weakness.   1. Acute renal failure on chronic kidney disease stage III with hyponatremia, hyperkalemia and proteinuria: N17.9, N18.3, E87.1, E87.5, R80.9 - holding enalapril due to acute renal failure and hyperkalemia - UOP 1200 cc  2. Acute pancytopenia: leukocytopenia, anemia and thrombocytopenia - PRBC transfusion 5/7.   3. A Fib with RVR.  - rate control - ensure hemodynamic stability    LOS: 5 Norissa Bartee 5/11/20164:31 PM

## 2014-10-23 NOTE — Progress Notes (Signed)
Inpatient Diabetes Program Recommendations  AACE/ADA: New Consensus Statement on Inpatient Glycemic Control (2013)  Target Ranges:  Prepandial:   less than 140 mg/dL      Peak postprandial:   less than 180 mg/dL (1-2 hours)      Critically ill patients:  140 - 180 mg/dL   Results for NATION, CRADLE (MRN [id number]) as of 10/23/2014 10:25  Ref. Range 10/22/2014 07:52 10/22/2014 12:34 10/22/2014 16:54 10/22/2014 21:40 10/23/2014 07:20  Glucose-Capillary Latest Ref Range: 70-99 mg/dL 159 (H) 248 (H) 257 (H) 254 (H) 168 (H)   Diabetes history: DM2 Outpatient Diabetes medications: Glipizide XL 10 mg BID, Levemir 15 units QHS Current orders for Inpatient glycemic control: Novolog 0-9 units TID with meals  Inpatient Diabetes Program Recommendations Insulin - Basal: Please consider ordering Levemir 5 units daily (based on 57 kg x 0.1 units). Diet: Currenlty ordered Regular diet. Glucose up to 257 mg/dl on 5/10. Please consider changing diet to carb modified.  Thanks, Barnie Alderman, RN, MSN, CCRN, CDE Diabetes Coordinator Inpatient Diabetes Program [phone] (Team Pager from [place] to [place]) [phone] ([facility] office) [phone] [facility] office)

## 2014-10-23 NOTE — Care Management (Signed)
Spoke with patient's wife.  Prior to this admission, patient independent in all adls and occasionally drove.  Patient has been up in room without assist.  She is not "so sure need home health physical therapy".

## 2014-10-23 NOTE — Progress Notes (Signed)
[facility] Hematology/Oncology Progress Note  Date of admission: 10/18/2014  Hospital day:  10/23/2014  Chief Complaint: Peter Walters is a 79 y.o. male who was admitted on 10/18/2014 with progressive weakness, renal insufficiency, and pancytopenia.  Subjective: Patient has converted to sinus rhythm and is now out of the ICU. Currently he feels well and is asymptomatic. He has no chest pain or shortness of breath. He does not complain of any further nosebleeds. His appetite has significantly improved. He denies any pain. Patient offers no further specific complaints today.  Social History: The patient is accompanied by his wife  Allergies:  Allergies  Allergen Reactions  . Aspirin Hives and Other (See Comments)    Reaction:  Causes pt to bleed.   . Celecoxib Other (See Comments)    Reaction:  Causes pt to bleed.     Medications Prior to Admission  Medication Sig Dispense Refill  . atorvastatin (LIPITOR) 40 MG tablet Take 40 mg by mouth at bedtime.    Marland Kitchen doxazosin (CARDURA) 2 MG tablet Take 2 mg by mouth at bedtime.    Marland Kitchen glipiZIDE (GLUCOTROL XL) 10 MG 24 hr tablet Take 10 mg by mouth 2 (two) times daily.    . magnesium oxide (MAG-OX) 400 MG tablet Take 400 mg by mouth 2 (two) times daily.    . metoprolol tartrate (LOPRESSOR) 25 MG tablet Take 12.5 mg by mouth 2 (two) times daily.     Marland Kitchen acetaminophen (TYLENOL) 325 MG tablet Take 650 mg by mouth every 4 (four) hours as needed.    . cetirizine (ZYRTEC) 10 MG tablet Take 10 mg by mouth daily.    Marland Kitchen dicyclomine (BENTYL) 20 MG tablet Take 1 tablet (20 mg total) by mouth every 6 (six) hours. As needed for abdominal pain (Patient not taking: Reported on 10/18/2014) 20 tablet 0  . dofetilide (TIKOSYN) 500 MCG capsule Take 500 mcg by mouth every 12 (twelve) hours.     . enalapril (VASOTEC) 20 MG tablet Take 20 mg by mouth 2 (two) times daily.     . insulin detemir (LEVEMIR) 100 UNIT/ML injection Inject 15 Units into the skin at  bedtime.    . lidocaine-prilocaine (EMLA) cream Apply 1 application topically once.    . loperamide (IMODIUM A-D) 2 MG tablet Take 1 tablet (2 mg total) by mouth 4 (four) times daily as needed for diarrhea or loose stools. 30 tablet 0  . megestrol (MEGACE) 40 MG/ML suspension Take 40 mg by mouth daily.    . ondansetron (ZOFRAN-ODT) 8 MG disintegrating tablet Take 1 tablet (8 mg total) by mouth every 8 (eight) hours as needed for nausea. (Patient not taking: Reported on 10/18/2014) 30 tablet 0  . pantoprazole (PROTONIX) 40 MG tablet Take 40 mg by mouth daily.    . ranitidine (ZANTAC) 150 MG tablet Take 150 mg by mouth 2 (two) times daily.    Marland Kitchen warfarin (COUMADIN) 1 MG tablet Take 2 mg by mouth at bedtime.    Marland Kitchen warfarin (COUMADIN) 5 MG tablet Take 5 mg by mouth at bedtime.      Review of Systems: GENERAL:  Feels better.  Working with PT.  No fevers, sweats or weight loss. PERFORMANCE STATUS (ECOG):  1 Lungs: No shortness of breath or cough.  No hemoptysis. Cardiac:  No chest pain, palpitations, orthopnea, or PND. GI:  No nausea, vomiting, diarrhea, constipation, melena or hematochezia. Musculoskeletal:  No back pain.  No joint pain.  No muscle tenderness. Extremities:  No pain or swelling. Skin:  No rashes or skin changes. Psych:  No mood changes, depression or anxiety. Pain:  No focal pain. Review of systems:  All other systems reviewed and found to be negative.  Physical Exam: Blood pressure 152/75, pulse 86, temperature 98.5 F (36.9 C), temperature source Oral, resp. rate 18, height 5' 5" (1.651 m), weight 126 lb 6.4 oz (57.335 kg), SpO2 100 %.  GENERAL: Thin elderly gentleman sitting comfortably in bed on the medical unit.  MENTAL STATUS: Alert and oriented to person, place and time. HEAD: Short graying hair. Mustache. Temporal wasting. Normocephalic, atraumatic, face symmetric, no Cushingoid features. EYES: Brown eyes. Right pupil reactive to light. Keeps left eye closed  secondary to sensitivity to light (chronic). No conjunctivitis or scleral icterus. ENT: Oropharynx clear without lesion. Tongue normal. Mucous membranes moist.  RESPIRATORY: Clear to auscultation without rales, wheezes or rhonchi. CARDIOVASCULAR: Regular rate and rhythm without murmur, rub or gallop. ABDOMEN: Soft, non-tender, with active bowel sounds, and no hepatosplenomegaly. No masses. SKIN: No rashes, ulcers or lesions. EXTREMITIES: No edema, no skin discoloration or tenderness. No palpable cords. NEUROLOGICAL: Unremarkable. PSYCH: Appropriate  Results for orders placed or performed during the hospital encounter of 10/18/14 (from the past 48 hour(s))  Glucose, capillary     Status: Abnormal   Collection Time: 10/21/14  4:31 PM  Result Value Ref Range   Glucose-Capillary 197 (H) 70 - 99 mg/dL   Comment 1 Notify RN   Glucose, capillary     Status: Abnormal   Collection Time: 10/21/14  9:35 PM  Result Value Ref Range   Glucose-Capillary 264 (H) 70 - 99 mg/dL  Glucose, capillary     Status: Abnormal   Collection Time: 10/22/14  1:58 AM  Result Value Ref Range   Glucose-Capillary 207 (H) 70 - 99 mg/dL  CBC with Differential/Platelet     Status: Abnormal   Collection Time: 10/22/14  4:18 AM  Result Value Ref Range   WBC 3.7 (L) 3.8 - 10.6 K/uL   RBC 2.60 (L) 4.40 - 5.90 MIL/uL   Hemoglobin 7.9 (L) 13.0 - 18.0 g/dL   HCT 23.1 (L) 40.0 - 52.0 %   MCV 88.6 80.0 - 100.0 fL   MCH 30.3 26.0 - 34.0 pg   MCHC 34.2 32.0 - 36.0 g/dL   RDW 18.7 (H) 11.5 - 14.5 %   Platelets 29 (LL) 150 - 440 K/uL    Comment: CRITICAL RESULT CALLED TO, READ BACK BY AND VERIFIED WITH: CARLA QUINCER AT 0545 ON 10/22/14. TSH    Neutrophils Relative % 52 %   Lymphocytes Relative 34 %   Monocytes Relative 10 %   Eosinophils Relative 4 %   Basophils Relative 0 %   Neutro Abs 1.9 1.4 - 6.5 K/uL   Lymphs Abs 1.3 1.0 - 3.6 K/uL   Monocytes Absolute 0.4 0.2 - 1.0 K/uL   Eosinophils Absolute 0.1 0 -  0.7 K/uL   Basophils Absolute 0.0 0 - 0.1 K/uL  Basic metabolic panel     Status: Abnormal   Collection Time: 10/22/14  4:18 AM  Result Value Ref Range   Sodium 133 (L) 135 - 145 mmol/L   Potassium 3.5 3.5 - 5.1 mmol/L   Chloride 111 101 - 111 mmol/L   CO2 19 (L) 22 - 32 mmol/L   Glucose, Bld 189 (H) 65 - 99 mg/dL   BUN 23 (H) 6 - 20 mg/dL   Creatinine, Ser 1.65 (H) 0.61 - 1.24 mg/dL     Calcium 7.6 (L) 8.9 - 10.3 mg/dL   GFR calc non Af Amer 39 (L) >60 mL/min   GFR calc Af Amer 45 (L) >60 mL/min    Comment: (NOTE) The eGFR has been calculated using the CKD EPI equation. This calculation has not been validated in all clinical situations. eGFR's persistently <60 mL/min signify possible Chronic Kidney Disease.    Anion gap 3 (L) 5 - 15  Glucose, capillary     Status: Abnormal   Collection Time: 10/22/14  7:52 AM  Result Value Ref Range   Glucose-Capillary 159 (H) 70 - 99 mg/dL  Glucose, capillary     Status: Abnormal   Collection Time: 10/22/14 12:34 PM  Result Value Ref Range   Glucose-Capillary 248 (H) 70 - 99 mg/dL  Glucose, capillary     Status: Abnormal   Collection Time: 10/22/14  4:54 PM  Result Value Ref Range   Glucose-Capillary 257 (H) 70 - 99 mg/dL  Glucose, capillary     Status: Abnormal   Collection Time: 10/22/14  9:40 PM  Result Value Ref Range   Glucose-Capillary 254 (H) 70 - 99 mg/dL   Comment 1 Call MD NNP PA CNM   Glucose, capillary     Status: Abnormal   Collection Time: 10/23/14  7:20 AM  Result Value Ref Range   Glucose-Capillary 168 (H) 70 - 99 mg/dL  Glucose, capillary     Status: Abnormal   Collection Time: 10/23/14 11:24 AM  Result Value Ref Range   Glucose-Capillary 193 (H) 70 - 99 mg/dL   No results found.  Assessment:  Peter Walters is a 78 y.o. male with progressive stage III multiple myeloma on third line therapy. 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 He was admitted on 10/18/2014 with progressive weakness likely due to  dehydration and pancytopenia secondary to progressive marrow involvement.   Plan: 1. Anemia: patient received 2 units PRBC this past weekend with improvement of his hemoglobin. He does not require additional transfusion at this time. 2. Thrombocytopenia: Platelet count still significantly decreased to 27, but stable. Patient has no further nosebleeds. Recheck platelet count in the morning prior to discharge.  3. Multiple myeloma: Treatment on hold temporarily. Discussed at length with patient and his wife that further treatments may not be possible given his severe thrombocytopenia. Plan to readdress treatments in 1 week upon discharge. 4. Elevated creatinine: Trending down. Appreciate nephrology input. 5. Cardiology: Appreciate cardiology assistance. Patient now in normal sinus rhythm and on oral medications. If vital signs remained stable, plan to discharge in the morning. With follow-up with cardiology as indicated. 6. Disposition: Plan to discharge home tomorrow.   Timothy J Finnegan, MD 10/23/2014, 1:08 PM  

## 2014-10-23 NOTE — Progress Notes (Signed)
[facility] Encounter Note  Patient: Peter Walters / Admit Date: 10/18/2014 / Date of Encounter: 10/23/2014, 7:40 AM   Subjective: Weak and fatigued with mild shortness of breath and now with conversion to nsr Review of Systems: Positive for: Fatigue and shortness of breath Negative for: Others previously listed  Objective: Telemetry:NSR Physical Exam: Blood pressure 134/67, pulse 84, temperature 99.4 F (37.4 C), temperature source Oral, resp. rate 26, height 5' 5"  (1.651 m), weight 126 lb 6.4 oz (57.335 kg), SpO2 100 %. Body mass index is 21.03 kg/(m^2). General: Well developed, well nourished, in no acute distress. Head: Normocephalic, atraumatic, sclera non-icteric, no xanthomas, nares are without discharge. Neck: No apparent masses Lungs: Normal respirations with few wheezes, no rhonchi,  Rales and/or crackles   Heart: regular rate and rhythm, normal S1-S2, no murmur, no rub, no gallop, PMI is normal size and placement, carotid upstroke normal without bruit, jugular venous pressure normal Abdomen: Soft, non-tender, non-distended with normoactive bowel sounds. No apparent hepatosplenomegaly. Abdominal aorta is normal size without bruit Extremities: No edema, no clubbing, no cyanosis, no ulcers,  Peripheral: 2+ radial, 2+ femoral, 2+ dorsal pedal pulses Neuro: Alert and oriented X 3. Moves all extremities spontaneously. Psych:  Responds to questions appropriately with a normal affect.   Intake/Output Summary (Last 24 hours) at 10/23/14 0740 Last data filed at 10/23/14 0147  Gross per 24 hour  Intake   1335 ml  Output   1195 ml  Net    140 ml    Inpatient Medications:  . atorvastatin  40 mg Oral QHS  . calcium-vitamin D  1 tablet Oral Q breakfast  . diltiazem  240 mg Oral Daily  . doxazosin  2 mg Oral QHS  . feeding supplement (GLUCERNA SHAKE)  237 mL Oral BID WC  . insulin aspart  0-9 Units Subcutaneous TID WC  . lidocaine-prilocaine  1 application  Topical Once  . loratadine  10 mg Oral Daily  . megestrol  40 mg Oral Daily  . metoprolol tartrate  25 mg Oral 4 times per day  . pantoprazole  40 mg Oral QAC breakfast   Infusions:  . sodium chloride 50 mL/hr at 10/22/14 0725    Labs:  Recent Labs  10/21/14 0421 10/22/14 0418  NA 135 133*  K 3.9 3.5  CL 113* 111  CO2 17* 19*  GLUCOSE 118* 189*  BUN 21* 23*  CREATININE 1.68* 1.65*  CALCIUM 8.0* 7.6*    Recent Labs  10/21/14 0421  AST 17  ALT 12*  ALKPHOS 45  BILITOT 1.0  PROT 10.6*  ALBUMIN 2.6*    Recent Labs  10/21/14 0421 10/22/14 0418  WBC 3.7* 3.7*  NEUTROABS  --  1.9  HGB 8.5* 7.9*  HCT 24.5* 23.1*  MCV 88.0 88.6  PLT 27* 29*   No results for input(s): CKTOTAL, CKMB, TROPONINI in the last 72 hours. Invalid input(s): POCBNP No results for input(s): HGBA1C in the last 72 hours.   Weights: Filed Weights   10/22/14 0200 10/22/14 2133 10/23/14 0418  Weight: 161 lb 2.5 oz (73.1 kg) 166 lb 12.8 oz (75.66 kg) 126 lb 6.4 oz (57.335 kg)     Radiology/Studies:  Ct Abdomen Pelvis Wo Contrast  10/13/2014   CLINICAL DATA:  Abdominal pain for 24 hr. Acute renal failure. History of progressive multiple myeloma last seen in Oncology 09/16/2014. Congestive heart failure. History of COPD, GERD.  EXAM: CT ABDOMEN AND PELVIS WITHOUT CONTRAST  TECHNIQUE: Multidetector CT imaging  of the abdomen and pelvis was performed following the standard protocol without IV contrast.  COMPARISON:  03/11/2013  FINDINGS: Lower chest: Mild scarring at the left lung base. There are emphysematous changes in within the lung bases. Heart size is normal.  Upper abdomen: There is a small amount of perihepatic fluid. Liver otherwise is normal in appearance. No focal abnormality identified within the spleen, pancreas, or adrenal glands. Kidneys have a normal appearance. No intrarenal or ureteral stones. Gallbladder is present.  Gastrointestinal tract: Marked thickening of numerous small bowel  loops, associated with mesenteric fluid especially within the right lower quadrant. There is no associated obstruction. There are skip areas of normal appearing small bowel, suggesting involvement by Crohn's disease. The stomach has a normal appearance. There are colonic diverticula. Although there is a small amount of fluid around the colon there is no evidence for inflammation arising from the colon. Appendix is normal.  There is hazy density within the mesentery, associated with small but numerous lymph nodes as seen on prior study. Postoperative clips are identified within the mesenteric.  Pelvis: Free pelvic fluid is present. The urinary bladder has a normal appearance. Prostate and seminal vesicles are normal in appearance. No pelvic adenopathy.  Retroperitoneum: Small, nonspecific retroperitoneal lymph nodes.  Abdominal wall: Unremarkable.  Osseous structures: Degenerative changes are seen in the lower spine. No suspicious lytic or blastic lesions identified.  IMPRESSION: 1. Significant abnormality of multiple small bowel loops, associated with significant mesenteric fluid especially involving the right lower quadrant loops. Considerations include amyloidosis, Crohn's disease, infectious or inflammatory etiologies, lymphoma, vasculitis. 2. Colonic diverticulosis.  No diverticulitis. 3. Small amount of pelvic ascites and perihepatic fluid. 4. Hazy density within the mesentery associated with small, numerous lymph nodes. Findings are possibly inflammatory and appear relatively stable. 5. No abscess.   Electronically Signed   By: Nolon Nations M.D.   On: 10/13/2014 17:10   US Renal  10/19/2014   CLINICAL DATA:  Acute renal failure, history atrial fibrillation, hypertension, multiple myeloma, coronary artery disease, CHF, diabetes  EXAM: RENAL / URINARY TRACT ULTRASOUND COMPLETE  COMPARISON:  CT abdomen and pelvis 10/13/2014  FINDINGS: Right Kidney:  Length: 10.7 cm. Normal cortical thickness. Upper normal  cortical echogenicity. No mass, hydronephrosis or shadowing calcification. No perinephric fluid.  Left Kidney:  Length: 10.6 cm. Normal cortical thickness with upper normal cortical echogenicity. No mass, hydronephrosis or shadowing calcification. No perinephric fluid.  Bladder:  Normal appearance.  IMPRESSION: No renal sonographic abnormalities identified.   Electronically Signed   By: Lavonia Dana M.D.   On: 10/19/2014 15:43     Assessment and Recommendation  79 y.o. male with known paroxysmal nonvalvular atrial fibrillation essential hypertension coronary artery disease status post history of PCI and stent placement having an acute renal failure and malignant hypertension now with recurrence of atrial fibrillation with rapid ventricular rate and spontaneous conversion to nsr after additon of new meds needing further medication management 1. Continue metoprolol for heart rate control and maintenance of normal sinus rhythm and added oral Cardizem CD for better heart rate control which is working well 2. No change in current medical regimen for hypertension control 3. Avoid ACE inhibitor due to acute renal failure 4. No further intervention of coronary artery disease currently stable without evidence of symptoms and/or myocardial infarction 5.  avoid Tikosyn due to acute renal failure and concerns of side effects of the medication 6.   avoid anticoagulation due to low platelets and miantianing nsr  7. Continue  supportive care of concerns of multiple myeloma and acute renal failure 8. No further cardiac diagnostics necessary at this time 9. Consideration of amiodarone if necessary if patient we'll need for maintenance of normal sinus rhythm but likely will not need at this time 10.ok fro transfer back to oncology floor 11.call if further questions and fu with Eiliyah Reh in 1-2wks for further medication adjustments  Signed, Serafina Royals M.D. FACC

## 2014-10-23 NOTE — Progress Notes (Signed)
Pt alertx4. VSS. 1 assist to bathroom. No complaints of pain. NS on tele. Wife at bedside. Will continue to monitor.

## 2014-10-23 NOTE — Consult Note (Addendum)
[facility] [place] follow up   PATIENT NAME: Peter Walters    MR#:  [id number]  DATE OF BIRTH:  07/17/1935  DATE OF ADMISSION:  10/18/2014  PRIMARY CARE PHYSICIAN: Petra Kuba, MD   REQUESTING/REFERRING PHYSICIAN: Nolon Stalls, M.D  CHIEF COMPLAINT:  Peter Walters  is a 79 y.o. male with a known history of Recurring Multiple myeloma on chemo currently, Afib on coumadin, HTN, BPH, CAD s/p PCI, DM, GERD admitted for generalised weakness for 2-3 weeks now but worse to the point that he was not able to even get up out of bed prior to admission.  Heart rate now controlled. Back on telemetry floor. No further episodes of RVR overnight. Nurses are documenting tachypnea, but he has no respiratory distress. No other complaints. Has eaten well.   PAST MEDICAL HISTORY:   Past Medical History  Diagnosis Date  . A-fib   . Hypertension   . Benign prostatic hypertrophy   . History of gastrointestinal bleeding   . Hyperlipidemia   . History of multiple myeloma   . CAD (coronary artery disease)   . GERD (gastroesophageal reflux disease)   . CHF (congestive heart failure)   . Peptic ulcer disease   . Hyponatremia   . Multiple myeloma in relapse 10/17/2014  . Cancer     multiple myeloma  . Diabetes mellitus without complication   . 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 Cardiac catheterization    . Coronary angioplasty      [facility]  . Eye surgery      SOCIAL HISTORY:   History  Substance Use Topics  . Smoking status: Never Smoker   . Smokeless tobacco: Not on file  . Alcohol Use: No    FAMILY HISTORY:   Family History  Problem Relation Age of Onset  . Heart disease Father   . Kidney failure Brother     DRUG ALLERGIES:   Allergies  Allergen Reactions  . Aspirin Hives and Other (See Comments)   Reaction:  Causes pt to bleed.   . Celecoxib Other (See Comments)    Reaction:  Causes pt to bleed.     REVIEW OF SYSTEMS:  CONSTITUTIONAL: No fever, Positive for  Fatigue and weakness.  RESPIRATORY: No cough, shortness of breath, wheezing or hemoptysis.  CARDIOVASCULAR:+ chest pain with exertion, orthopnea, edema.  GASTROINTESTINAL: No nausea, vomiting, diarrhea or abdominal pain.  GENITOURINARY: No dysuria, hematuria.  ENDOCRINE: No polyuria, nocturia,  HEMATOLOGY: No anemia, easy bruising or bleeding SKIN: No rash or lesion. MUSCULOSKELETAL: No joint pain or arthritis.   NEUROLOGIC: Positive for tingling and numbness for both legs and both hands since his chemo.  PSYCHIATRY: No anxiety or depression.   MEDICATIONS AT HOME:   Prior to Admission medications   Medication Sig Start Date End Date Taking? Authorizing Provider  atorvastatin (LIPITOR) 40 MG tablet Take 40 mg by mouth at bedtime.   Yes Historical Provider, MD  doxazosin (CARDURA) 2 MG tablet Take 2 mg by mouth at bedtime.   Yes Historical Provider, MD  glipiZIDE (GLUCOTROL XL) 10 MG 24 hr tablet Take 10 mg by mouth 2 (two) times daily.   Yes Historical Provider, MD  magnesium oxide (MAG-OX) 400 MG tablet Take 400 mg by mouth 2 (two) times daily.   Yes Historical Provider, MD  metoprolol tartrate (  LOPRESSOR) 25 MG tablet Take 12.5 mg by mouth 2 (two) times daily.    Yes Historical Provider, MD  acetaminophen (TYLENOL) 325 MG tablet Take 650 mg by mouth every 4 (four) hours as needed.    Historical Provider, MD  cetirizine (ZYRTEC) 10 MG tablet Take 10 mg by mouth daily.    Historical Provider, MD  dicyclomine (BENTYL) 20 MG tablet Take 1 tablet (20 mg total) by mouth every 6 (six) hours. As needed for abdominal pain Patient not taking: Reported on 10/18/2014 10/13/14   Boris Lown, DO  dofetilide (TIKOSYN) 500 MCG capsule Take 500 mcg by mouth every 12 (twelve) hours.     Historical Provider, MD  enalapril (VASOTEC) 20 MG  tablet Take 20 mg by mouth 2 (two) times daily.     Historical Provider, MD  insulin detemir (LEVEMIR) 100 UNIT/ML injection Inject 15 Units into the skin at bedtime.    Historical Provider, MD  lidocaine-prilocaine (EMLA) cream Apply 1 application topically once.    Historical Provider, MD  loperamide (IMODIUM A-D) 2 MG tablet Take 1 tablet (2 mg total) by mouth 4 (four) times daily as needed for diarrhea or loose stools. 10/16/14   Sheryl Precious Haws, DO  megestrol (MEGACE) 40 MG/ML suspension Take 40 mg by mouth daily.    Historical Provider, MD  ondansetron (ZOFRAN-ODT) 8 MG disintegrating tablet Take 1 tablet (8 mg total) by mouth every 8 (eight) hours as needed for nausea. Patient not taking: Reported on 10/18/2014 10/13/14   Boris Lown, DO  pantoprazole (PROTONIX) 40 MG tablet Take 40 mg by mouth daily.    Historical Provider, MD  ranitidine (ZANTAC) 150 MG tablet Take 150 mg by mouth 2 (two) times daily.    Historical Provider, MD  warfarin (COUMADIN) 1 MG tablet Take 2 mg by mouth at bedtime.    Historical Provider, MD  warfarin (COUMADIN) 5 MG tablet Take 5 mg by mouth at bedtime.    Historical Provider, MD      VITAL SIGNS:  Blood pressure 152/75, pulse 86, temperature 98.5 F (36.9 C), temperature source Oral, resp. rate 18, height 5' 5"  (1.651 m), weight 57.335 kg (126 lb 6.4 oz), SpO2 100 %.  PHYSICAL EXAMINATION:  GENERAL:  79 y.o.-year-old patient lying in the bed with no acute distress. Thin EYES: Pupils post surgical, equal, round, reactive to light and accommodation. No scleral icterus. Extraocular muscles intact.  HEENT: Head atraumatic, normocephalic. Oropharynx and nasopharynx clear.  NECK:  Supple, no jugular venous distention. No thyroid enlargement, no tenderness.  LUNGS: Normal breath sounds bilaterally, no wheezing, rales,rhonchi or crepitation. No use of accessory muscles of respiration.  CARDIOVASCULAR: S1, S2 normal. 3/6 systolic murmur present, No rubs, or  gallops.  ABDOMEN: Soft, nontender, nondistended. Bowel sounds present. No organomegaly or mass.  EXTREMITIES: No pedal edema, cyanosis, or clubbing.  NEUROLOGIC: Cranial nerves II through XII are intact. Muscle strength 5/5 in all extremities. Sensation intact. Gait not checked. Generalized weakness present PSYCHIATRIC: The patient is alert and oriented x 3.  SKIN: No obvious rash, lesion, or ulcer.   LABORATORY PANEL:   CBC  Recent Labs Lab 10/22/14 0418  WBC 3.7*  HGB 7.9*  HCT 23.1*  PLT 29*   ------------------------------------------------------------------------------------------------------------------  Chemistries   Recent Labs Lab 10/18/14 1107  10/21/14 0421 10/22/14 0418  NA 130*  < > 135 133*  K 4.7  < > 3.9 3.5  CL 108  < > 113* 111  CO2 18*  < >  17* 19*  GLUCOSE 161*  < > 118* 189*  BUN 44*  < > 21* 23*  CREATININE 2.77*  < > 1.68* 1.65*  CALCIUM 8.4*  < > 8.0* 7.6*  MG 2.1  --   --   --   AST 19  < > 17  --   ALT 14*  < > 12*  --   ALKPHOS 53  < > 45  --   BILITOT 0.7  < > 1.0  --   < > = values in this interval not displayed. ------------------------------------------------------------------------------------------------------------------  Cardiac Enzymes No results for input(s): TROPONINI in the last 168 hours. ------------------------------------------------------------------------------------------------------------------  RADIOLOGY:  No results found.  EKG:   Orders placed or performed during the hospital encounter of 10/18/14  . EKG 12-Lead  . EKG 12-Lead    IMPRESSION AND PLAN:   78y/oM with multiple myeloma on chemo, HTN, DM, CAD s/p PCI, Afib on coumadin, BPH, GERD admitted for generalised weakness and noted to have acute on chronic renal failure and anemia. Medical consult requested for Medical management  1. Acute anemia- secondary to multiple myeloma hgb improved since transfusion, now up to 8.5, trending downwards, not  checked today Mgmt per oncology, Transfuse for hb <7 Hold coumadin- likely not a good candidate for warfarin with significant anemia  2. Acute on chronic renal failure- baseline cr of 2.0, at 2.7 on adm: improved and better than baseline Prerenal likely and also multiple myeloma Decrease IVF Nephrology following May be able to restart ACE inhibitor at this time  3. Throbocytopenia- stable chronic, monitor, Tx plts if plt count <20k  4. CAD s/p PCI- stable, does have CP with exertion cardiology following, on  metoprolol and statin. No asa due to anemia and thrombocytopenia  5. DM- controlled Hold glipizide while inpatient Continue ssi  6. Multiple Myeloma- Progressive multiple myeloma, on third line therapy. Overall poor prognosis. Mgmt per oncology team  7. Afib- Tikosyn held due to renal failure and prolonged QT, will not restart. Rate now controlled. Continue Cardizem and metoprolol. Cardiology following  8. DVT prophylaxis- TEDs, SCDs  9. Deconditioning - will need HHPT and rolling walker  All the records are reviewed and case discussed with Consulting provider. Management plans discussed with the patient, family and they are in agreement.  CODE STATUS: Full Code  TOTAL TIME TAKING CARE OF THIS PATIENT: 25 minutes.    Myrtis Ser M.D on 10/23/2014 at 1:08 PM  Between 7am to 6pm - Pager - [phone]  After 6pm go to [email or web address] - password [email or web address] [facility]  Office  [phone]  CC: Primary care Physician: Petra Kuba, MD

## 2014-10-24 LAB — BASIC METABOLIC PANEL
Anion gap: 4 — ABNORMAL LOW (ref 5–15)
BUN: 18 mg/dL (ref 6–20)
CALCIUM: 8.5 mg/dL — AB (ref 8.9–10.3)
CO2: 21 mmol/L — AB (ref 22–32)
CREATININE: 1.48 mg/dL — AB (ref 0.61–1.24)
Chloride: 108 mmol/L (ref 101–111)
GFR calc Af Amer: 51 mL/min — ABNORMAL LOW (ref >60)
GFR, EST NON AFRICAN AMERICAN: 44 mL/min — AB (ref >60)
GLUCOSE: 159 mg/dL — AB (ref 65–99)
Potassium: 4.4 mmol/L (ref 3.5–5.1)
Sodium: 133 mmol/L — ABNORMAL LOW (ref 135–145)

## 2014-10-24 LAB — CBC WITH DIFFERENTIAL/PLATELET
BASOS ABS: 0 10*3/uL (ref 0–0.1)
Basophils Relative: 1 %
Eosinophils Absolute: 0.2 10*3/uL (ref 0–0.7)
Eosinophils Relative: 5 %
HCT: 24.7 % — ABNORMAL LOW (ref 40.0–52.0)
Hemoglobin: 8.4 g/dL — ABNORMAL LOW (ref 13.0–18.0)
Lymphocytes Relative: 31 %
Lymphs Abs: 1.2 10*3/uL (ref 1.0–3.6)
MCH: 30.4 pg (ref 26.0–34.0)
MCHC: 33.9 g/dL (ref 32.0–36.0)
MCV: 89.7 fL (ref 80.0–100.0)
Monocytes Absolute: 0.5 10*3/uL (ref 0.2–1.0)
Monocytes Relative: 12 %
NEUTROS ABS: 2 10*3/uL (ref 1.4–6.5)
Neutrophils Relative %: 51 %
Platelets: 30 10*3/uL — ABNORMAL LOW (ref 150–440)
RBC: 2.75 MIL/uL — ABNORMAL LOW (ref 4.40–5.90)
RDW: 18.2 % — AB (ref 11.5–14.5)
WBC: 3.9 10*3/uL (ref 3.8–10.6)

## 2014-10-24 LAB — GLUCOSE, CAPILLARY
GLUCOSE-CAPILLARY: 154 mg/dL — AB (ref 65–99)
Glucose-Capillary: 214 mg/dL — ABNORMAL HIGH (ref 65–99)
Glucose-Capillary: 203 mg/dL — ABNORMAL HIGH (ref 65–99)

## 2014-10-24 MED ORDER — CALCIUM CARBONATE-VITAMIN D 500-200 MG-UNIT PO TABS: 1.0000 | ORAL_TABLET | Freq: Every day | ORAL | Status: AC

## 2014-10-24 MED ORDER — GLUCERNA SHAKE PO LIQD: 237.0000 mL | Freq: Two times a day (BID) | ORAL | Status: AC

## 2014-10-24 MED ORDER — DILTIAZEM HCL ER COATED BEADS 240 MG PO CP24: 240.0000 mg | ORAL_CAPSULE | Freq: Every day | ORAL | Status: AC

## 2014-10-24 MED ORDER — CIPROFLOXACIN HCL 0.3 % OP SOLN
2.0000 [drp] | OPHTHALMIC | Status: DC
  Administered 2014-10-24 (×2): 2 [drp] via OPHTHALMIC
  Filled 2014-10-24: qty 2.5

## 2014-10-24 MED ORDER — METOPROLOL TARTRATE 25 MG PO TABS: 25.0000 mg | ORAL_TABLET | Freq: Four times a day (QID) | ORAL | Status: DC

## 2014-10-24 NOTE — Care Management CHF Note (Signed)
Attending- Dr Volanda Napoleon states that patient is cleared medically for discharge.  He is on oncology service so that service will have to discharge and complete home health order and face to face.  Informed primary nurse

## 2014-10-24 NOTE — Progress Notes (Signed)
Diabetes history: DM2 Outpatient Diabetes medications: Glipizide XL 10 mg BID, Levemir 15 units QHS Current orders for Inpatient glycemic control: Novolog 0-9 units TID with meals   Please consider ordering Levemir 8 units daily (0.1units/kg) Fasting glucose 154 mg/dl on 5/11.  Currently ordered a regular diet.  Please consider changing diet to carb modified.  Gentry Fitz, RN, BA, MHA, CDE Diabetes Coordinator Inpatient Diabetes Program  [phone] (Team Pager) [phone] ([place]) 10/24/2014 2:23 PM

## 2014-10-24 NOTE — Discharge Summary (Signed)
Physician Discharge Summary   Patient ID: Peter Walters [id number] 79 y.o. [birth year]  Admit date: 10/18/2014  Discharge date and time: No discharge date for patient encounter.   Admitting Physician: Lloyd Huger, MD   Discharge Physician: Grayland Ormond  Admission Diagnoses: multiple myeloma  thrombocytopenia acute renal failure  Discharge Diagnoses: Declining performance status.  Admission Condition: fair  Discharged Condition: fair  Indication for Admission:  Peter Walters is a 79 y.o. male with progressive stage III multiple myeloma on third line therapy. 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 He was admitted on 10/18/2014 with progressive weakness likely due to dehydration and pancytopenia secondary to progressive marrow involvement.    Hospital Course: Patient's performance status gradually improved. This prior to discharge she is admitted to the ICU with rapid atrial fibrillation. His symptoms resolved approximately 24 hours later. Patient continued to feel weak and fatigued but improved since admission. Patient offers no further specific complaints today  Consults: Cardiology, hospitalist.  Significant Diagnostic Studies: none  Treatments: IV hydration   Discharge Exam: BP 154/92 mmHg  Pulse 77  Temp(Src) 98.6 F (37 C) (Oral)  Resp 18  Ht 5' 5"  (1.651 m)  Wt 166 lb 3.2 oz (75.388 kg)  BMI 27.66 kg/m2  SpO2 100%  General Appearance:    Alert, cooperative, no distress, appears stated age  Head:    Normocephalic, without obvious abnormality, atraumatic  Eyes:    PERRL, conjunctiva/corneas clear, EOM's intact, fundi    benign, both eyes       Ears:    Normal TM's and external ear canals, both ears  Nose:   Nares normal, septum midline, mucosa normal, no drainage    or sinus tenderness  Throat:   Lips, mucosa, and tongue normal; teeth and gums normal  Neck:   Supple, symmetrical, trachea midline, no adenopathy;       thyroid:  No  enlargement/tenderness/nodules; no carotid   bruit or JVD  Back:     Symmetric, no curvature, ROM normal, no CVA tenderness  Lungs:     Clear to auscultation bilaterally, respirations unlabored  Chest wall:    No tenderness or deformity  Heart:    Regular rate and rhythm, S1 and S2 normal, no murmur, rub   or gallop  Abdomen:     Soft, non-tender, bowel sounds active all four quadrants,    no masses, no organomegaly  Genitalia:    Normal male without lesion, discharge or tenderness  Rectal:    Normal tone, normal prostate, no masses or tenderness;   guaiac negative stool  Extremities:   Extremities normal, atraumatic, no cyanosis or edema  Pulses:   2+ and symmetric all extremities  Skin:   Skin color, texture, turgor normal, no rashes or lesions  Lymph nodes:   Cervical, supraclavicular, and axillary nodes normal  Neurologic:   CNII-XII intact. Normal strength, sensation and reflexes      throughout    Disposition: 01-Home or Self Care  Patient Instructions:    Medication List    ASK your doctor about these medications        acetaminophen 325 MG tablet  Commonly known as:  TYLENOL  Take 650 mg by mouth every 4 (four) hours as needed.     atorvastatin 40 MG tablet  Commonly known as:  LIPITOR  Take 40 mg by mouth at bedtime.     cetirizine 10 MG tablet  Commonly known as:  ZYRTEC  Take 10 mg by mouth daily.  dicyclomine 20 MG tablet  Commonly known as:  BENTYL  Take 1 tablet (20 mg total) by mouth every 6 (six) hours. As needed for abdominal pain     dofetilide 500 MCG capsule  Commonly known as:  TIKOSYN  Take 500 mcg by mouth every 12 (twelve) hours.     doxazosin 2 MG tablet  Commonly known as:  CARDURA  Take 2 mg by mouth at bedtime.     enalapril 20 MG tablet  Commonly known as:  VASOTEC  Take 20 mg by mouth 2 (two) times daily.     glipiZIDE 10 MG 24 hr tablet  Commonly known as:  GLUCOTROL XL  Take 10 mg by mouth 2 (two) times daily.     insulin  detemir 100 UNIT/ML injection  Commonly known as:  LEVEMIR  Inject 15 Units into the skin at bedtime.     lidocaine-prilocaine cream  Commonly known as:  EMLA  Apply 1 application topically once.     loperamide 2 MG tablet  Commonly known as:  IMODIUM A-D  Take 1 tablet (2 mg total) by mouth 4 (four) times daily as needed for diarrhea or loose stools.     magnesium oxide 400 MG tablet  Commonly known as:  MAG-OX  Take 400 mg by mouth 2 (two) times daily.     megestrol 40 MG/ML suspension  Commonly known as:  MEGACE  Take 40 mg by mouth daily.     metoprolol tartrate 25 MG tablet  Commonly known as:  LOPRESSOR  Take 12.5 mg by mouth 2 (two) times daily.     ondansetron 8 MG disintegrating tablet  Commonly known as:  ZOFRAN-ODT  Take 1 tablet (8 mg total) by mouth every 8 (eight) hours as needed for nausea.     pantoprazole 40 MG tablet  Commonly known as:  PROTONIX  Take 40 mg by mouth daily.     ranitidine 150 MG tablet  Commonly known as:  ZANTAC  Take 150 mg by mouth 2 (two) times daily.     warfarin 1 MG tablet  Commonly known as:  COUMADIN  Take 2 mg by mouth at bedtime.     warfarin 5 MG tablet  Commonly known as:  COUMADIN  Take 5 mg by mouth at bedtime.       Activity: activity as tolerated Diet: regular diet Wound Care: none needed  Follow-up with Dr. Grayland Ormond in 1 week.  1. Anemia: patient received 2 units PRBC this past week with improvement of his hemoglobin. He does not require additional transfusion at this time. 2. Thrombocytopenia: Platelet count still significantly decreased but stable. Patient has no further nosebleeds.  3. Multiple myeloma: Treatment on hold temporarily. Discussed at length with patient and his wife that further treatments may not be possible given his severe thrombocytopenia. Plan to readdress treatments in 1 week upon discharge. 4. Elevated creatinine: Trending down. Appreciate nephrology input. 5. Cardiology: Appreciate  cardiology assistance. Patient now in normal sinus rhythm and on oral medications. If vital signs remained stable, plan to discharge in the morning. With follow-up with cardiology as indicated. 6. Disposition: Discharge home today.  Signed: Lloyd Huger 10/24/2014 4:05 PM

## 2014-10-24 NOTE — Progress Notes (Signed)
Subjective:  UOP fair HR controlled Feels well. No acute c/o Reports able to eat without N/V   Objective:  Vital signs in last 24 hours:  Temp:  [98.6 F (37 C)-99.7 F (37.6 C)] 98.6 F (37 C) (05/12 1134) Pulse Rate:  [74-84] 77 (05/12 1134) Resp:  [18-22] 18 (05/12 1134) BP: (148-154)/(75-92) 154/92 mmHg (05/12 1134) SpO2:  [98 %-100 %] 100 % (05/12 1134) Weight:  [75.388 kg (166 lb 3.2 oz)] 75.388 kg (166 lb 3.2 oz) (05/12 0605)  Weight change: -0.272 kg (-9.6 oz) Filed Weights   10/22/14 2133 10/23/14 0418 10/24/14 0605  Weight: 75.66 kg (166 lb 12.8 oz) 57.335 kg (126 lb 6.4 oz) 75.388 kg (166 lb 3.2 oz)    Intake/Output: I/O last 3 completed shifts: In: 1050 [P.O.:1000; I.V.:50] Out: 3025 [Urine:3025]   Intake/Output this shift:  Total I/O In: 600 [P.O.:600] Out: 375 [Urine:375]  Physical Exam: General: NAD,   Head: Normocephalic, atraumatic. Moist oral mucosal membranes  Eyes: Anicteric,  Neck: Supple, trachea midline  Lungs:  Clear to auscultation  Heart: A Fib, rate controlled  Abdomen:  Soft, nontender,   Extremities:  peripheral edema.  Neurologic: Nonfocal, moving all four extremities  Skin: No acute lesions       Basic Metabolic Panel:  Recent Labs Lab 10/18/14 1107 10/19/14 0436 10/20/14 0435 10/21/14 0421 10/22/14 0418 10/24/14 0112  NA 130* 134* 132* 135 133* 133*  K 4.7 4.6 4.0 3.9 3.5 4.4  CL 108 113* 114* 113* 111 108  CO2 18* 18* 16* 17* 19* 21*  GLUCOSE 161* 133* 142* 118* 189* 159*  BUN 44* 34* 27* 21* 23* 18  CREATININE 2.77* 2.23* 1.85* 1.68* 1.65* 1.48*  CALCIUM 8.4* 7.6* 7.7* 8.0* 7.6* 8.5*  MG 2.1  --   --   --   --   --   PHOS  --   --  1.6*  --   --   --     Liver Function Tests:  Recent Labs Lab 10/18/14 1107 10/19/14 0436 10/21/14 0421  AST _0 ALT 14* 12* 12*  ALKPHOS 53 41 45  BILITOT 0.7 0.8 1.0  PROT >12.0* 10.5* 10.6*  ALBUMIN 3.1* 2.6* 2.6*   No results for input(s): LIPASE, AMYLASE in  the last 168 hours. No results for input(s): AMMONIA in the last 168 hours.  CBC:  Recent Labs Lab 10/18/14 1107 10/19/14 0436 10/20/14 0435 10/21/14 0421 10/22/14 0418 10/24/14 0112  WBC 3.7* 2.4* 3.4* 3.7* 3.7* 3.9  NEUTROABS 1.5  --  1.8  --  1.9 2.0  HGB 7.4* 6.2* 8.5* 8.5* 7.9* 8.4*  HCT 21.7* 18.4* 24.0* 24.5* 23.1* 24.7*  MCV 91.5 91.6 88.3 88.0 88.6 89.7  PLT 32* 26* 28* 27* 29* 30*    Cardiac Enzymes: No results for input(s): CKTOTAL, CKMB, CKMBINDEX, TROPONINI in the last 168 hours.  BNP: Invalid input(s): POCBNP  CBG:  Recent Labs Lab 10/23/14 1623 10/23/14 2118 10/24/14 0724 10/24/14 1137 10/24/14 1645  GLUCAP 200* 248* 154* 214* 16*    Microbiology: Results for orders placed or performed during the hospital encounter of 09/18/14  Stool culture     Status: None   Collection Time: 09/18/14 11:07 PM  Result Value Ref Range Status   Micro Text Report   Final       COMMENT                   NO SALMONELLA OR SHIGELLA ISOLATED  COMMENT                   NO PATHOGENIC E.COLI DETECTED   COMMENT                   NO CAMPYLOBACTER ANTIGEN DETECTED   ANTIBIOTIC                                                      Clostridium Difficile ([facility])     Status: None   Collection Time: 09/19/14  4:06 AM  Result Value Ref Range Status   Micro Text Report   Final       C.DIFFICILE ANTIGEN       C.DIFFICILE GDH ANTIGEN : NEGATIVE   C.DIFFICILE TOXIN A/B     C.DIFFICILE TOXINS A AND B : NEGATIVE   INTERPRETATION            Negative for C. difficile.    ANTIBIOTIC                                                        Coagulation Studies: No results for input(s): LABPROT, INR in the last 72 hours.  Urinalysis: No results for input(s): COLORURINE, LABSPEC, PHURINE, GLUCOSEU, HGBUR, BILIRUBINUR, KETONESUR, PROTEINUR, UROBILINOGEN, NITRITE, LEUKOCYTESUR in the last 72 hours.  Invalid input(s): APPERANCEUR    Imaging: No results found.   Medications:      . atorvastatin  40 mg Oral QHS  . calcium-vitamin D  1 tablet Oral Q breakfast  . ciprofloxacin  2 drop Left Eye Q4H while awake  . diltiazem  240 mg Oral Daily  . doxazosin  2 mg Oral QHS  . feeding supplement (GLUCERNA SHAKE)  237 mL Oral BID WC  . megestrol  40 mg Oral Daily  . metoprolol tartrate  25 mg Oral 4 times per day   acetaminophen, oxyCODONE, senna-docusate  Assessment/ Plan:  [name] is a 79 y.o.black male with multiple myeloma, atrial fibrillation, hypertension, hyperlipidmia, coronary artery disease status post stent, RIJ port, anemia, history of peptic ulcer disease, GERD, diabetes mellitus type II, BPH and allergic rhinitis.   Patient was admitted to [facility] on 10/18/2014 with progressive weakness.   1. Acute renal failure on chronic kidney disease stage III with hyponatremia, hyperkalemia and proteinuria: N17.9, N18.3, E87.1, E87.5, R80.9 - holding enalapril due to acute renal failure and hyperkalemia - UOP 2625 cc - S Cr improved  2. Acute pancytopenia: leukocytopenia, anemia and thrombocytopenia, Multiple myeloma - PRBC transfusion 5/7.   3. A Fib with RVR.  - rate control - ensure hemodynamic stability    LOS: 6 SINGH,HARMEET 5/12/20166:00 PM   

## 2014-10-24 NOTE — Care Management Note (Signed)
Case Management Note  Patient Details  Name: JAHSHUA BONITO MRN: [id number] Date of Birth: 1935-10-12  Subjective/Objective:           For discharge home today         Action/Plan:   Expected Discharge Date:                  Expected Discharge Plan:   Home with home health nursing and physical therapy  In-House Referral:     Discharge planning Services     Post Acute Care Choice:   no preference Choice offered to:     DME Arranged:    DME Agency:     HH Arranged:   SN [facility] PT HH Agency:   [facility]  Status of Service:   [facility] has received referral  Medicare Important Message Given:  Yes Date Medicare IM Given:  10/21/14 Medicare IM give by:  Shelbie Ammons RN MSN Care Management  Date Additional Medicare IM Given:   10/24/14 Additional Medicare Important Message give by:   Joni Reining  If discussed at Long Length of Stay Meetings, dates discussed:    Additional Comments:  Katrina Stack, RN 10/24/2014, 11:46 AM

## 2014-10-24 NOTE — Progress Notes (Signed)
Nutrition Follow-up  DOCUMENTATION CODES:   INTERVENTION: Medical Nutrition Supplement: Continue Glucerna shake  Meals and Snacks: Cater to patient preferences   NUTRITION DIAGNOSIS:  Inadequate oral intake related to cancer and cancer related treatments improving as evidenced by patient eating 90-100% of meals x past 24 hrs.  GOAL: Energy Intake: Patient will meet greater than or equal to 90% of their needs with meals and supplements- currently meeting   MONITOR:  PO intake, Supplement acceptance  REASON FOR ASSESSMENT:  Other (Comment) (RD Follow Up) Assessment of nutrition requirement/status  ASSESSMENT: Clinical Update: Pt feeling better and eating well Typical Food/ Fluid Intake: 90-100% of meals recorded per I/O x 24 hrs Labs:Electrolyte and Renal Profile:    Recent Labs Lab 10/18/14 1107  10/20/14 0435 10/21/14 0421 10/22/14 0418 10/24/14 0112  BUN 44*  < > 27* 21* 23* 18  CREATININE 2.77*  < > 1.85* 1.68* 1.65* 1.48*  NA 130*  < > 132* 135 133* 133*  K 4.7  < > 4.0 3.9 3.5 4.4  MG 2.1  --   --   --   --   --   PHOS  --   --  1.6*  --   --   --   < > = values in this interval not displayed. Protein Profile:   Recent Labs Lab 10/18/14 1107 10/19/14 0436 10/21/14 0421  ALBUMIN 3.1* 2.6* 2.6*   Meds: Megace, NS@ 50/hr Physical Findings: n/a  Height:  Ht Readings from Last 1 Encounters:  10/22/14 5\' 5"  (1.651 m)    Weight:  Wt Readings from Last 1 Encounters:  10/24/14 166 lb 3.2 oz (75.388 kg)    Ideal Body Weight:     Wt Readings from Last 10 Encounters:  10/24/14 166 lb 3.2 oz (75.388 kg)  10/13/14 163 lb (73.936 kg)    BMI:  Body mass index is 27.66 kg/(m^2).  Estimated Nutritional Needs:  Kcal:  1873-2215kcals, BEE: 1419kcals, (IF 1.1-1.3)(AF 1.2)  Protein:  72-86g protein (1.0-1.2g/kg)  Fluid:  800-2135mL of fluid (25-59mL/kg)  Skin:  Reviewed, no issues  Diet Order:  Diet regular Room service appropriate?: Yes; Fluid  consistency:: Thin  EDUCATION NEEDS:  Education needs no appropriate at this time   Intake/Output Summary (Last 24 hours) at 10/24/14 1234 Last data filed at 10/24/14 1127  Gross per 24 hour  Intake    600 ml  Output   2800 ml  Net  -2200 ml    Last BM:  5/11  Roda Shutters, RDN Pager: [phone] Office: [place]

## 2014-10-24 NOTE — Discharge Instructions (Signed)
[facility] WILL PROVIDE NURSING AND PHYSICAL THERAPY  [phone].  AGENCY WILL CALL TO SET UP INITIAL VISIT

## 2014-10-24 NOTE — Consult Note (Signed)
[facility] [place] [facility] follow up   PATIENT NAME: Peter Walters    MR#:  [id number]  DATE OF BIRTH:  14-May-1936  DATE OF ADMISSION:  10/18/2014  PRIMARY CARE PHYSICIAN: Petra Kuba, MD   REQUESTING/REFERRING PHYSICIAN: Nolon Stalls, M.D  CHIEF COMPLAINT:  Peter Walters  is a 79 y.o. male with a known history of Recurring Multiple myeloma on chemo currently, Afib on coumadin, HTN, BPH, CAD s/p PCI, DM, GERD admitted for generalised weakness for 2-3 weeks now but worse to the point that he was not able to even get up out of bed prior to admission.  NO complaints. No further RVR.  Eating well and in good spirits.  PAST MEDICAL HISTORY:   Past Medical History  Diagnosis Date  . A-fib   . Hypertension   . Benign prostatic hypertrophy   . History of gastrointestinal bleeding   . Hyperlipidemia   . History of multiple myeloma   . CAD (coronary artery disease)   . GERD (gastroesophageal reflux disease)   . CHF (congestive heart failure)   . Peptic ulcer disease   . Hyponatremia   . Multiple myeloma in relapse 10/17/2014  . Cancer     multiple myeloma  . Diabetes mellitus without complication   . 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 Cardiac catheterization    . Coronary angioplasty      [facility]  . Eye surgery      SOCIAL HISTORY:   History  Substance Use Topics  . Smoking status: Never Smoker   . Smokeless tobacco: Not on file  . Alcohol Use: No    FAMILY HISTORY:   Family History  Problem Relation Age of Onset  . Heart disease Father   . Kidney failure Brother     DRUG ALLERGIES:   Allergies  Allergen Reactions  . Aspirin Hives and Other (See Comments)    Reaction:  Causes pt to bleed.   . Celecoxib Other (See Comments)    Reaction:  Causes pt to bleed.     REVIEW OF SYSTEMS:   CONSTITUTIONAL: No fever, Positive for  Fatigue and weakness.  RESPIRATORY: No cough, shortness of breath, wheezing or hemoptysis.  CARDIOVASCULAR:+ chest pain with exertion, orthopnea, edema.  GASTROINTESTINAL: No nausea, vomiting, diarrhea or abdominal pain.  GENITOURINARY: No dysuria, hematuria.  ENDOCRINE: No polyuria, nocturia,  HEMATOLOGY: No anemia, easy bruising or bleeding SKIN: No rash or lesion. MUSCULOSKELETAL: No joint pain or arthritis.   NEUROLOGIC: Positive for tingling and numbness for both legs and both hands since his chemo.  PSYCHIATRY: No anxiety or depression.   MEDICATIONS AT HOME:   Prior to Admission medications   Medication Sig Start Date End Date Taking? Authorizing Provider  atorvastatin (LIPITOR) 40 MG tablet Take 40 mg by mouth at bedtime.   Yes Historical Provider, MD  doxazosin (CARDURA) 2 MG tablet Take 2 mg by mouth at bedtime.   Yes Historical Provider, MD  glipiZIDE (GLUCOTROL XL) 10 MG 24 hr tablet Take 10 mg by mouth 2 (two) times daily.   Yes Historical Provider, MD  magnesium oxide (MAG-OX) 400 MG tablet Take 400 mg by mouth 2 (two) times daily.   Yes Historical Provider, MD  metoprolol tartrate (LOPRESSOR) 25 MG tablet Take 12.5 mg by mouth 2 (two) times daily.    Yes  Historical Provider, MD  acetaminophen (TYLENOL) 325 MG tablet Take 650 mg by mouth every 4 (four) hours as needed.    Historical Provider, MD  cetirizine (ZYRTEC) 10 MG tablet Take 10 mg by mouth daily.    Historical Provider, MD  dicyclomine (BENTYL) 20 MG tablet Take 1 tablet (20 mg total) by mouth every 6 (six) hours. As needed for abdominal pain Patient not taking: Reported on 10/18/2014 10/13/14   Boris Lown, DO  dofetilide (TIKOSYN) 500 MCG capsule Take 500 mcg by mouth every 12 (twelve) hours.     Historical Provider, MD  enalapril (VASOTEC) 20 MG tablet Take 20 mg by mouth 2 (two) times daily.     Historical Provider, MD  insulin detemir (LEVEMIR) 100 UNIT/ML injection  Inject 15 Units into the skin at bedtime.    Historical Provider, MD  lidocaine-prilocaine (EMLA) cream Apply 1 application topically once.    Historical Provider, MD  loperamide (IMODIUM A-D) 2 MG tablet Take 1 tablet (2 mg total) by mouth 4 (four) times daily as needed for diarrhea or loose stools. 10/16/14   Sheryl Precious Haws, DO  megestrol (MEGACE) 40 MG/ML suspension Take 40 mg by mouth daily.    Historical Provider, MD  ondansetron (ZOFRAN-ODT) 8 MG disintegrating tablet Take 1 tablet (8 mg total) by mouth every 8 (eight) hours as needed for nausea. Patient not taking: Reported on 10/18/2014 10/13/14   Boris Lown, DO  pantoprazole (PROTONIX) 40 MG tablet Take 40 mg by mouth daily.    Historical Provider, MD  ranitidine (ZANTAC) 150 MG tablet Take 150 mg by mouth 2 (two) times daily.    Historical Provider, MD  warfarin (COUMADIN) 1 MG tablet Take 2 mg by mouth at bedtime.    Historical Provider, MD  warfarin (COUMADIN) 5 MG tablet Take 5 mg by mouth at bedtime.    Historical Provider, MD      VITAL SIGNS:  Blood pressure 154/92, pulse 77, temperature 98.6 F (37 C), temperature source Oral, resp. rate 18, height 5' 5"  (1.651 m), weight 75.388 kg (166 lb 3.2 oz), SpO2 100 %.  PHYSICAL EXAMINATION:  GENERAL:  79 y.o.-year-old patient lying in the bed with no acute distress. Thin EYES: Pupils post surgical, equal, round, reactive to light and accommodation. No scleral icterus. Extraocular muscles intact.  HEENT: Head atraumatic, normocephalic. Oropharynx and nasopharynx clear.  NECK:  Supple, no jugular venous distention. No thyroid enlargement, no tenderness.  LUNGS: Normal breath sounds bilaterally, no wheezing, rales,rhonchi or crepitation. No use of accessory muscles of respiration.  CARDIOVASCULAR: S1, S2 normal. 3/6 systolic murmur present, No rubs, or gallops.  ABDOMEN: Soft, nontender, nondistended. Bowel sounds present. No organomegaly or mass.  EXTREMITIES: No pedal edema,  cyanosis, or clubbing.  NEUROLOGIC: Cranial nerves II through XII are intact. Muscle strength 5/5 in all extremities. Sensation intact. Gait not checked. Generalized weakness present PSYCHIATRIC: The patient is alert and oriented x 3.  SKIN: No obvious rash, lesion, or ulcer.   LABORATORY PANEL:   CBC  Recent Labs Lab 10/24/14 0112  WBC 3.9  HGB 8.4*  HCT 24.7*  PLT 30*   ------------------------------------------------------------------------------------------------------------------  Chemistries   Recent Labs Lab 10/18/14 1107  10/21/14 0421  10/24/14 0112  NA 130*  < > 135  < > 133*  K 4.7  < > 3.9  < > 4.4  CL 108  < > 113*  < > 108  CO2 18*  < > 17*  < > 21*  GLUCOSE 161*  < > 118*  < > 159*  BUN 44*  < > 21*  < > 18  CREATININE 2.77*  < > 1.68*  < > 1.48*  CALCIUM 8.4*  < > 8.0*  < > 8.5*  MG 2.1  --   --   --   --   AST 19  < > 17  --   --   ALT 14*  < > 12*  --   --   ALKPHOS 53  < > 45  --   --   BILITOT 0.7  < > 1.0  --   --   < > = values in this interval not displayed. ------------------------------------------------------------------------------------------------------------------  Cardiac Enzymes No results for input(s): TROPONINI in the last 168 hours. ------------------------------------------------------------------------------------------------------------------  RADIOLOGY:  No results found.  EKG:   Orders placed or performed during the hospital encounter of 10/18/14  . EKG 12-Lead  . EKG 12-Lead    IMPRESSION AND PLAN:   78y/oM with multiple myeloma on chemo, HTN, DM, CAD s/p PCI, Afib on coumadin, BPH, GERD admitted for generalised weakness and noted to have acute on chronic renal failure and anemia. Medical consult requested for Medical management  1. Acute anemia- secondary to multiple myeloma hgb improved since transfusion, now up to 8.4 Mgmt per oncology, Transfuse for hb <7 Hold coumadin- likely not a good candidate for  warfarin with significant anemia  2. Acute on chronic renal failure- baseline cr of 2.0, at 2.7 on adm: improved and better than baseline Prerenal likely and also multiple myeloma Decrease IVF Nephrology following May be able to restart ACE inhibitor at this time  3. Throbocytopenia- stable chronic, monitor, Tx plts if plt count <20k  4. CAD s/p PCI- stable, does have CP with exertion which is chronic cardiology following, on  metoprolol and statin. No asa due to anemia and thrombocytopenia  5. DM- controlled Hold glipizide while inpatient, restart on discharge. Continue ssi  6. Multiple Myeloma- Progressive multiple myeloma, on third line therapy. Overall poor prognosis. Mgmt per oncology team  7. Afib- Tikosyn held due to renal failure and prolonged QT, will not restart. Rate now controlled. Continue Cardizem and metoprolol. Cardiology following  8. DVT prophylaxis- TEDs, SCDs  9. Deconditioning - will need HHPT and rolling walker  All the records are reviewed and case discussed with Consulting provider. Management plans discussed with the patient, family and they are in agreement.  CODE STATUS: Full Code  TOTAL TIME TAKING CARE OF THIS PATIENT: 25 minutes.    Myrtis Ser M.D on 10/24/2014 at 4:25 PM  Between 7am to 6pm - Pager - [phone]  After 6pm go to [email or web address] - password [email or web address] [facility]  Office  [phone]  CC: Primary care Physician: Petra Kuba, MD

## 2014-10-25 NOTE — Progress Notes (Signed)
This encounter was created in error - please disregard.

## 2014-10-27 ENCOUNTER — Other Ambulatory Visit: Payer: Self-pay | Admitting: Oncology

## 2014-10-27 DIAGNOSIS — C9002 Multiple myeloma in relapse: Secondary | ICD-10-CM

## 2014-10-29 ENCOUNTER — Inpatient Hospital Stay (HOSPITAL_BASED_OUTPATIENT_CLINIC_OR_DEPARTMENT_OTHER): Payer: Medicare HMO | Admitting: Oncology

## 2014-10-29 ENCOUNTER — Inpatient Hospital Stay: Payer: Medicare HMO

## 2014-10-29 VITALS — BP 149/77 | HR 78 | Temp 96.8°F | Resp 16

## 2014-10-29 DIAGNOSIS — E876 Hypokalemia: Secondary | ICD-10-CM | POA: Diagnosis not present

## 2014-10-29 DIAGNOSIS — Z79899 Other long term (current) drug therapy: Secondary | ICD-10-CM | POA: Diagnosis not present

## 2014-10-29 DIAGNOSIS — N189 Chronic kidney disease, unspecified: Secondary | ICD-10-CM | POA: Diagnosis not present

## 2014-10-29 DIAGNOSIS — I10 Essential (primary) hypertension: Secondary | ICD-10-CM | POA: Diagnosis not present

## 2014-10-29 DIAGNOSIS — C9002 Multiple myeloma in relapse: Secondary | ICD-10-CM

## 2014-10-29 DIAGNOSIS — C9000 Multiple myeloma not having achieved remission: Secondary | ICD-10-CM | POA: Diagnosis not present

## 2014-10-29 DIAGNOSIS — D649 Anemia, unspecified: Secondary | ICD-10-CM

## 2014-10-29 DIAGNOSIS — D696 Thrombocytopenia, unspecified: Secondary | ICD-10-CM | POA: Diagnosis not present

## 2014-10-29 MED ORDER — SODIUM CHLORIDE 0.9 % IV SOLN
Freq: Once | INTRAVENOUS | Status: AC
  Administered 2014-10-29: 13:00:00 via INTRAVENOUS
  Filled 2014-10-29: qty 1000

## 2014-10-29 MED ORDER — HEPARIN SOD (PORK) LOCK FLUSH 100 UNIT/ML IV SOLN
INTRAVENOUS | Status: AC
  Filled 2014-10-29: qty 5

## 2014-10-29 MED ORDER — HEPARIN SOD (PORK) LOCK FLUSH 100 UNIT/ML IV SOLN
500.0000 [IU] | Freq: Once | INTRAVENOUS | Status: AC
  Administered 2014-10-29: 500 [IU] via INTRAVENOUS
  Filled 2014-10-29: qty 5

## 2014-10-29 MED ORDER — SODIUM CHLORIDE 0.9 % IV SOLN
Freq: Once | INTRAVENOUS | Status: AC
  Filled 2014-10-29: qty 1000

## 2014-10-31 NOTE — Progress Notes (Signed)
[facility]  Telephone:(336) [phone] Fax:(336) [phone]  ID: Chestine Spore OB: 04-22-1936  MR#: [id number]  OIB#:[id number]  Patient Care Team: Petra Kuba, MD as PCP - General (Family Medicine)  CHIEF COMPLAINT:  Chief Complaint  Patient presents with  . Hospitalization Follow-up    INTERVAL HISTORY: Patient returns to clinic for further evaluation and hospital follow-up. He continues to have weakness and fatigue. He states his appetite has significantly improved. He denies any pain.  He denies any fevers. He denies weight loss. He has no neurologic complaints. He denies any chest pain or shortness of breath. He has no urinary complaints. Patient offers no further specific complaints today.    REVIEW OF SYSTEMS:   Review of Systems  Constitutional: Positive for malaise/fatigue. Negative for fever.  Respiratory: Negative for shortness of breath.   Cardiovascular: Negative for chest pain.  Gastrointestinal: Positive for diarrhea. Negative for nausea.  Neurological: Positive for weakness.    As per HPI. Otherwise, a complete review of systems is negatve.  PAST MEDICAL HISTORY: Past Medical History  Diagnosis Date  . A-fib   . Hypertension   . Benign prostatic hypertrophy   . History of gastrointestinal bleeding   . Hyperlipidemia   . History of multiple myeloma   . CAD (coronary artery disease)   . GERD (gastroesophageal reflux disease)   . CHF (congestive heart failure)   . Peptic ulcer disease   . Hyponatremia   . Multiple myeloma in relapse 10/17/2014  . Cancer     multiple myeloma  . Diabetes mellitus without complication   . 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 Cardiac catheterization    . Coronary angioplasty      [facility]  . Eye surgery      FAMILY HISTORY Family History  Problem Relation Age of Onset  . Heart  disease Father   . Kidney failure Brother        ADVANCED DIRECTIVES:    HEALTH MAINTENANCE: History  Substance Use Topics  . Smoking status: Never Smoker   . Smokeless tobacco: Not on file  . Alcohol Use: No     Colonoscopy:  PAP:  Bone density:  Lipid panel:  Allergies  Allergen Reactions  . Aspirin Hives and Other (See Comments)    Reaction:  Causes pt to bleed.   . Celecoxib Other (See Comments)    Reaction:  Causes pt to bleed.     Current Outpatient Prescriptions  Medication Sig Dispense Refill  . atorvastatin (LIPITOR) 40 MG tablet Take 40 mg by mouth at bedtime.    . calcium-vitamin D (OSCAL WITH D) 500-200 MG-UNIT per tablet Take 1 tablet by mouth daily with breakfast. 30 tablet 5  . cetirizine (ZYRTEC) 10 MG tablet Take 10 mg by mouth daily.    Marland Kitchen diltiazem (CARDIZEM CD) 240 MG 24 hr capsule Take 1 capsule (240 mg total) by mouth daily. 30 capsule 5  . doxazosin (CARDURA) 2 MG tablet Take 2 mg by mouth at bedtime.    . feeding supplement, GLUCERNA SHAKE, (GLUCERNA SHAKE) LIQD Take 237 mLs by mouth 2 (two) times daily with a meal. 24 Can 0  . glipiZIDE (GLUCOTROL XL) 10 MG 24 hr tablet Take 10 mg by mouth 2 (two) times daily.    . insulin detemir (LEVEMIR) 100 UNIT/ML injection Inject 15 Units into the skin  at bedtime.    . lidocaine-prilocaine (EMLA) cream Apply 1 application topically once.    . loperamide (IMODIUM A-D) 2 MG tablet Take 1 tablet (2 mg total) by mouth 4 (four) times daily as needed for diarrhea or loose stools. 30 tablet 0  . magnesium oxide (MAG-OX) 400 MG tablet Take 400 mg by mouth 2 (two) times daily.    . megestrol (MEGACE) 40 MG/ML suspension Take 40 mg by mouth daily.    . metoprolol tartrate (LOPRESSOR) 25 MG tablet Take 1 tablet (25 mg total) by mouth every 6 (six) hours. 120 tablet 1  . ondansetron (ZOFRAN-ODT) 8 MG disintegrating tablet Take 1 tablet (8 mg total) by mouth every 8 (eight) hours as needed for nausea. 30 tablet 0  .  pantoprazole (PROTONIX) 40 MG tablet Take 40 mg by mouth daily.    . ranitidine (ZANTAC) 150 MG tablet Take 150 mg by mouth 2 (two) times daily.     No current facility-administered medications for this visit.   Facility-Administered Medications Ordered in Other Visits  Medication Dose Route Frequency Provider Last Rate Last Dose  . 0.9 %  sodium chloride infusion   Intravenous Once Lloyd Huger, MD        OBJECTIVE: Filed Vitals:   10/29/14 1209  BP: 149/77  Pulse: 78  Temp: 96.8 F (36 C)  Resp: 16     There is no weight on file to calculate BMI.    ECOG FS:2 - Symptomatic, <50% confined to bed  General: Thin, no acute distress. Eyes: Left eye injected secondary to retained suture from surgery. Anicteric sclera. Lungs: Clear to auscultation bilaterally. Heart: Regular rate and rhythm. No rubs, murmurs, or gallops. Abdomen: Soft, nontender, nondistended. No organomegaly noted, normoactive bowel sounds. Musculoskeletal: No edema, cyanosis, or clubbing. Neuro: Alert, answering all questions appropriately. Cranial nerves grossly intact. Skin: No rashes or petechiae noted. Psych: Normal affect.    LAB RESULTS:  Lab Results  Component Value Date   NA 133* 10/24/2014   K 4.4 10/24/2014   CL 108 10/24/2014   CO2 21* 10/24/2014   GLUCOSE 159* 10/24/2014   BUN 18 10/24/2014   CREATININE 1.48* 10/24/2014   CALCIUM 8.5* 10/24/2014   PROT 10.6* 10/21/2014   ALBUMIN 2.6* 10/21/2014   AST 17 10/21/2014   ALT 12* 10/21/2014   ALKPHOS 45 10/21/2014   BILITOT 1.0 10/21/2014   GFRNONAA 44* 10/24/2014   GFRAA 51* 10/24/2014    Lab Results  Component Value Date   WBC 3.9 10/24/2014   NEUTROABS 2.0 10/24/2014   HGB 8.4* 10/24/2014   HCT 24.7* 10/24/2014   MCV 89.7 10/24/2014   PLT 30* 10/24/2014     STUDIES: Ct Abdomen Pelvis Wo Contrast  10/13/2014   CLINICAL DATA:  Abdominal pain for 24 hr. Acute renal failure. History of progressive multiple myeloma last seen in  Oncology 09/16/2014. Congestive heart failure. History of COPD, GERD.  EXAM: CT ABDOMEN AND PELVIS WITHOUT CONTRAST  TECHNIQUE: Multidetector CT imaging of the abdomen and pelvis was performed following the standard protocol without IV contrast.  COMPARISON:  03/11/2013  FINDINGS: Lower chest: Mild scarring at the left lung base. There are emphysematous changes in within the lung bases. Heart size is normal.  Upper abdomen: There is a small amount of perihepatic fluid. Liver otherwise is normal in appearance. No focal abnormality identified within the spleen, pancreas, or adrenal glands. Kidneys have a normal appearance. No intrarenal or ureteral stones. Gallbladder is present.  Gastrointestinal  tract: Marked thickening of numerous small bowel loops, associated with mesenteric fluid especially within the right lower quadrant. There is no associated obstruction. There are skip areas of normal appearing small bowel, suggesting involvement by Crohn's disease. The stomach has a normal appearance. There are colonic diverticula. Although there is a small amount of fluid around the colon there is no evidence for inflammation arising from the colon. Appendix is normal.  There is hazy density within the mesentery, associated with small but numerous lymph nodes as seen on prior study. Postoperative clips are identified within the mesenteric.  Pelvis: Free pelvic fluid is present. The urinary bladder has a normal appearance. Prostate and seminal vesicles are normal in appearance. No pelvic adenopathy.  Retroperitoneum: Small, nonspecific retroperitoneal lymph nodes.  Abdominal wall: Unremarkable.  Osseous structures: Degenerative changes are seen in the lower spine. No suspicious lytic or blastic lesions identified.  IMPRESSION: 1. Significant abnormality of multiple small bowel loops, associated with significant mesenteric fluid especially involving the right lower quadrant loops. Considerations include amyloidosis, Crohn's  disease, infectious or inflammatory etiologies, lymphoma, vasculitis. 2. Colonic diverticulosis.  No diverticulitis. 3. Small amount of pelvic ascites and perihepatic fluid. 4. Hazy density within the mesentery associated with small, numerous lymph nodes. Findings are possibly inflammatory and appear relatively stable. 5. No abscess.   Electronically Signed   By: Nolon Nations M.D.   On: 10/13/2014 17:10   US Renal  10/19/2014   CLINICAL DATA:  Acute renal failure, history atrial fibrillation, hypertension, multiple myeloma, coronary artery disease, CHF, diabetes  EXAM: RENAL / URINARY TRACT ULTRASOUND COMPLETE  COMPARISON:  CT abdomen and pelvis 10/13/2014  FINDINGS: Right Kidney:  Length: 10.7 cm. Normal cortical thickness. Upper normal cortical echogenicity. No mass, hydronephrosis or shadowing calcification. No perinephric fluid.  Left Kidney:  Length: 10.6 cm. Normal cortical thickness with upper normal cortical echogenicity. No mass, hydronephrosis or shadowing calcification. No perinephric fluid.  Bladder:  Normal appearance.  IMPRESSION: No renal sonographic abnormalities identified.   Electronically Signed   By: Lavonia Dana M.D.   On: 10/19/2014 15:43    ASSESSMENT: Progressive multiple myeloma, FISH with 13q deletion, which is considered poor prognosis.   PLAN:    1.  Multiple myeloma: Patient has recently received multiple chemotherapeutic regimens including Revlimid and Velcade. Patient's M spike has trended up to 4.5. He also has evidence of endorgan damage with anemia, thrombocytopenia, and renal insufficiency. Despite his decreased performance status, patient wishes to try additional treatment and will return to clinic in 1 week to receive dose reduced Kyporlis and cytoxan. Discontinuing treatment and hospice was also discussed today, but patient does not wish to pursue this at this time. 2.  Hypertension: Continue current medications. 3.  Hypokalemia: Patient was given dietary  recommendations. 4.  Anemia: Chronic and multifactorial. Secondary to underlying multiple myeloma as well as chemotherapy. Monitor. 5.  Thrombocytopenia: Likely secondary to progression of disease.   6.  Poor appetite: Improved, continue Megace.  7.  Chronic renal insufficiency: Creatinine is worse today, monitor.  Patient will also receive 1 L IV fluids today. 8.  Diarrhea: Improved. Continue Imodium OTC.  Approximate 30 minutes was spent in discussion and consultation.   Patient expressed understanding and was in agreement with this plan. He also understands that He can call clinic at any time with any questions, concerns, or complaints.   No matching staging information was found for the patient.  Lloyd Huger, MD   10/31/2014 11:14 AM

## 2014-11-05 ENCOUNTER — Inpatient Hospital Stay (HOSPITAL_BASED_OUTPATIENT_CLINIC_OR_DEPARTMENT_OTHER): Payer: Medicare HMO | Admitting: Oncology

## 2014-11-05 ENCOUNTER — Telehealth: Payer: Self-pay

## 2014-11-05 ENCOUNTER — Inpatient Hospital Stay: Payer: Medicare HMO

## 2014-11-05 VITALS — BP 136/83 | HR 66 | Temp 96.2°F | Resp 16

## 2014-11-05 DIAGNOSIS — I10 Essential (primary) hypertension: Secondary | ICD-10-CM

## 2014-11-05 DIAGNOSIS — N189 Chronic kidney disease, unspecified: Secondary | ICD-10-CM

## 2014-11-05 DIAGNOSIS — C9000 Multiple myeloma not having achieved remission: Secondary | ICD-10-CM | POA: Diagnosis not present

## 2014-11-05 DIAGNOSIS — Z79899 Other long term (current) drug therapy: Secondary | ICD-10-CM

## 2014-11-05 DIAGNOSIS — E876 Hypokalemia: Secondary | ICD-10-CM

## 2014-11-05 DIAGNOSIS — Z794 Long term (current) use of insulin: Secondary | ICD-10-CM

## 2014-11-05 DIAGNOSIS — E119 Type 2 diabetes mellitus without complications: Secondary | ICD-10-CM

## 2014-11-05 DIAGNOSIS — D696 Thrombocytopenia, unspecified: Secondary | ICD-10-CM

## 2014-11-05 DIAGNOSIS — C9002 Multiple myeloma in relapse: Secondary | ICD-10-CM

## 2014-11-05 DIAGNOSIS — D649 Anemia, unspecified: Secondary | ICD-10-CM | POA: Diagnosis not present

## 2014-11-05 LAB — CBC WITH DIFFERENTIAL/PLATELET
Basophils Absolute: 0 10*3/uL (ref 0–0.1)
Basophils Relative: 0 %
EOS ABS: 0.1 10*3/uL (ref 0–0.7)
Eosinophils Relative: 2 %
HCT: 23.3 % — ABNORMAL LOW (ref 40.0–52.0)
Hemoglobin: 7.8 g/dL — ABNORMAL LOW (ref 13.0–18.0)
LYMPHS PCT: 45 %
Lymphs Abs: 2 10*3/uL (ref 1.0–3.6)
MCH: 30.5 pg (ref 26.0–34.0)
MCHC: 33.7 g/dL (ref 32.0–36.0)
MCV: 90.5 fL (ref 80.0–100.0)
Monocytes Absolute: 0.4 10*3/uL (ref 0.2–1.0)
Monocytes Relative: 9 %
NEUTROS PCT: 44 %
Neutro Abs: 2 10*3/uL (ref 1.4–6.5)
PLATELETS: 19 10*3/uL — AB (ref 150–440)
RBC: 2.57 MIL/uL — AB (ref 4.40–5.90)
RDW: 18 % — ABNORMAL HIGH (ref 11.5–14.5)
WBC: 4.5 10*3/uL (ref 3.8–10.6)

## 2014-11-05 LAB — COMPREHENSIVE METABOLIC PANEL WITH GFR
ALT: 23 U/L (ref 17–63)
AST: 27 U/L (ref 15–41)
Albumin: 3.2 g/dL — ABNORMAL LOW (ref 3.5–5.0)
Alkaline Phosphatase: 52 U/L (ref 38–126)
Anion gap: 2 — ABNORMAL LOW (ref 5–15)
BUN: 40 mg/dL — ABNORMAL HIGH (ref 6–20)
CO2: 20 mmol/L — ABNORMAL LOW (ref 22–32)
Calcium: 8.6 mg/dL — ABNORMAL LOW (ref 8.9–10.3)
Chloride: 101 mmol/L (ref 101–111)
Creatinine, Ser: 2.26 mg/dL — ABNORMAL HIGH (ref 0.61–1.24)
GFR calc Af Amer: 31 mL/min — ABNORMAL LOW
GFR calc non Af Amer: 26 mL/min — ABNORMAL LOW
Glucose, Bld: 177 mg/dL — ABNORMAL HIGH (ref 65–99)
Potassium: 5 mmol/L (ref 3.5–5.1)
Sodium: 123 mmol/L — ABNORMAL LOW (ref 135–145)
Total Bilirubin: 0.8 mg/dL (ref 0.3–1.2)
Total Protein: >12 g/dL — ABNORMAL HIGH (ref 6.5–8.1)

## 2014-11-05 MED ORDER — SODIUM CHLORIDE 0.9 % IV SOLN
Freq: Once | INTRAVENOUS | Status: AC
  Administered 2014-11-05: 11:00:00 via INTRAVENOUS
  Filled 2014-11-05: qty 1000

## 2014-11-05 MED ORDER — SODIUM CHLORIDE 0.9 % IV SOLN
10.0000 mg | Freq: Once | INTRAVENOUS | Status: AC
  Administered 2014-11-05: 10 mg via INTRAVENOUS
  Filled 2014-11-05: qty 1

## 2014-11-05 MED ORDER — HEPARIN SOD (PORK) LOCK FLUSH 100 UNIT/ML IV SOLN
500.0000 [IU] | Freq: Once | INTRAVENOUS | Status: AC
  Administered 2014-11-05: 500 [IU] via INTRAVENOUS

## 2014-11-05 MED ORDER — HEPARIN SOD (PORK) LOCK FLUSH 100 UNIT/ML IV SOLN
INTRAVENOUS | Status: AC
  Filled 2014-11-05: qty 5

## 2014-11-05 NOTE — Telephone Encounter (Signed)
They are needing clarification on the IVF orders faxed./Peter Walters S   11/05/14 @ 4:05--Left message at contact number to return my call

## 2014-11-06 NOTE — Telephone Encounter (Signed)
11/06/14 @ 2:15--Left message at contact number to return my call./Seng Fouts S

## 2014-11-08 NOTE — Telephone Encounter (Signed)
Spoke with Duwayne Heck @ [facility]:  Per Dr. Grayland Ormond give one liber NS: IVF over one hour on 11/08/14, 11/10/14, 11/12/14.

## 2014-11-08 NOTE — Telephone Encounter (Signed)
11/08/14 @ 9:00--Called [facility] main number and they were not able to contact the home health dept at this time so I left a message for them to call.  In [place] office today so I left the [place] number as my contact number./Amore Ackman S

## 2014-11-12 ENCOUNTER — Inpatient Hospital Stay: Payer: Medicare HMO

## 2014-11-12 ENCOUNTER — Inpatient Hospital Stay (HOSPITAL_BASED_OUTPATIENT_CLINIC_OR_DEPARTMENT_OTHER): Payer: Medicare HMO | Admitting: Oncology

## 2014-11-12 VITALS — BP 153/84 | HR 69 | Temp 97.8°F | Resp 16

## 2014-11-12 DIAGNOSIS — Z79899 Other long term (current) drug therapy: Secondary | ICD-10-CM | POA: Diagnosis not present

## 2014-11-12 DIAGNOSIS — N189 Chronic kidney disease, unspecified: Secondary | ICD-10-CM

## 2014-11-12 DIAGNOSIS — C9002 Multiple myeloma in relapse: Secondary | ICD-10-CM

## 2014-11-12 DIAGNOSIS — E876 Hypokalemia: Secondary | ICD-10-CM

## 2014-11-12 DIAGNOSIS — D649 Anemia, unspecified: Secondary | ICD-10-CM

## 2014-11-12 DIAGNOSIS — E871 Hypo-osmolality and hyponatremia: Secondary | ICD-10-CM

## 2014-11-12 DIAGNOSIS — I10 Essential (primary) hypertension: Secondary | ICD-10-CM | POA: Diagnosis not present

## 2014-11-12 DIAGNOSIS — C9000 Multiple myeloma not having achieved remission: Secondary | ICD-10-CM

## 2014-11-12 DIAGNOSIS — D696 Thrombocytopenia, unspecified: Secondary | ICD-10-CM

## 2014-11-12 DIAGNOSIS — R630 Anorexia: Secondary | ICD-10-CM

## 2014-11-12 LAB — CBC WITH DIFFERENTIAL/PLATELET
BASOS ABS: 0 10*3/uL (ref 0–0.1)
Basophils Relative: 0 %
Eosinophils Absolute: 0 10*3/uL (ref 0–0.7)
Eosinophils Relative: 0 %
HCT: 21.5 % — ABNORMAL LOW (ref 40.0–52.0)
Hemoglobin: 7.3 g/dL — ABNORMAL LOW (ref 13.0–18.0)
Lymphs Abs: 1.7 10*3/uL (ref 1.0–3.6)
MCH: 30.6 pg (ref 26.0–34.0)
MCHC: 34 g/dL (ref 32.0–36.0)
MCV: 90.1 fL (ref 80.0–100.0)
MONO ABS: 0.5 10*3/uL (ref 0.2–1.0)
NEUTROS ABS: 3.7 10*3/uL (ref 1.4–6.5)
Platelets: 38 10*3/uL — ABNORMAL LOW (ref 150–440)
RBC: 2.39 MIL/uL — ABNORMAL LOW (ref 4.40–5.90)
RDW: 18.2 % — ABNORMAL HIGH (ref 11.5–14.5)
WBC: 5.8 10*3/uL (ref 3.8–10.6)

## 2014-11-12 LAB — BASIC METABOLIC PANEL
Anion gap: 4 — ABNORMAL LOW (ref 5–15)
BUN: 60 mg/dL — ABNORMAL HIGH (ref 6–20)
CALCIUM: 8.7 mg/dL — AB (ref 8.9–10.3)
CO2: 17 mmol/L — AB (ref 22–32)
Chloride: 98 mmol/L — ABNORMAL LOW (ref 101–111)
Creatinine, Ser: 1.62 mg/dL — ABNORMAL HIGH (ref 0.61–1.24)
GFR calc Af Amer: 46 mL/min — ABNORMAL LOW (ref >60)
GFR, EST NON AFRICAN AMERICAN: 40 mL/min — AB (ref >60)
GLUCOSE: 425 mg/dL — AB (ref 65–99)
Potassium: 5.6 mmol/L — ABNORMAL HIGH (ref 3.5–5.1)
Sodium: 119 mmol/L — CL (ref 135–145)

## 2014-11-12 NOTE — Progress Notes (Signed)
[place]  [phone] [phone]) [phone]  ID: Chestine Spore OB: [birth year]  MR#: [id number]  JJH#:[id number]  Patient Care Team: Petra Kuba, MD as PCP - General (Family Medicine)  CHIEF COMPLAINT:  Chief Complaint  Patient presents with  . Follow-up    Multiple myeloma    INTERVAL HISTORY: Patient returns to clinic for further evaluation and treatment planning. He continues to have significant weakness and fatigue and declining performance status. He has a poor appetite. His eyesight is significantly worse than his left eye.  He denies any pain.  He denies any fevers. He has no neurologic complaints. He denies any chest pain or shortness of breath. He has no urinary complaints. Patient offers no further specific complaints today.    REVIEW OF SYSTEMS:   Review of Systems  Constitutional: Positive for weight loss and malaise/fatigue. Negative for fever.  Neurological: Positive for weakness.    As per HPI. Otherwise, a complete review of systems is negatve.  PAST MEDICAL HISTORY: Past Medical History  Diagnosis Date  . A-fib   . Hypertension   . Benign prostatic hypertrophy   . History of gastrointestinal bleeding   . Hyperlipidemia   . History of multiple myeloma   . CAD (coronary artery disease)   . GERD (gastroesophageal reflux disease)   . CHF (congestive heart failure)   . Peptic ulcer disease   . Hyponatremia   . Multiple myeloma in relapse 10/17/2014  . Cancer     multiple myeloma  . Diabetes mellitus without complication   . 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 Cardiac catheterization    . Coronary angioplasty      [facility]  . Eye surgery      FAMILY HISTORY Family History  Problem Relation Age of Onset  . Heart disease Father   . Kidney failure Brother        ADVANCED DIRECTIVES:    HEALTH  MAINTENANCE: History  Substance Use Topics  . Smoking status: Never Smoker   . Smokeless tobacco: Not on file  . Alcohol Use: No     Colonoscopy:  PAP:  Bone density:  Lipid panel:  Allergies  Allergen Reactions  . Aspirin Hives and Other (See Comments)    Reaction:  Causes pt to bleed.   . Celecoxib Other (See Comments)    Reaction:  Causes pt to bleed.     Current Outpatient Prescriptions  Medication Sig Dispense Refill  . atorvastatin (LIPITOR) 40 MG tablet Take 40 mg by mouth at bedtime.    Marland Kitchen atropine 1 % ophthalmic ointment Administer 1 application into the left eye Two (2) times a day.    . brimonidine (ALPHAGAN) 0.2 % ophthalmic solution Administer 1 drop into the left eye Three (3) times a day.    . calcium-vitamin D (OSCAL WITH D) 500-200 MG-UNIT per tablet Take 1 tablet by mouth daily with breakfast. 30 tablet 5  . cetirizine (ZYRTEC) 10 MG tablet Take 10 mg by mouth daily.    . ciprofloxacin (CILOXAN) 0.3 % ophthalmic solution     . diltiazem (CARDIZEM CD) 240 MG 24 hr capsule Take 1 capsule (240 mg total) by mouth daily. 30 capsule 5  . dorzolamide-timolol (COSOPT) 22.3-6.8 MG/ML ophthalmic solution     . doxazosin (CARDURA) 2 MG tablet Take 2 mg by mouth at bedtime.    Marland Kitchen  feeding supplement, GLUCERNA SHAKE, (GLUCERNA SHAKE) LIQD Take 237 mLs by mouth 2 (two) times daily with a meal. 24 Can 0  . glipiZIDE (GLUCOTROL XL) 10 MG 24 hr tablet Take 10 mg by mouth 2 (two) times daily.    . insulin detemir (LEVEMIR) 100 UNIT/ML injection Inject 15 Units into the skin at bedtime.    Marland Kitchen latanoprost (XALATAN) 0.005 % ophthalmic solution Administer 1 drop into the left eye nightly.    . lidocaine-prilocaine (EMLA) cream Apply 1 application topically once.    . loperamide (IMODIUM A-D) 2 MG tablet Take 1 tablet (2 mg total) by mouth 4 (four) times daily as needed for diarrhea or loose stools. 30 tablet 0  . magnesium oxide (MAG-OX) 400 MG tablet Take 400 mg by mouth 2 (two) times  daily.    . megestrol (MEGACE) 40 MG/ML suspension Take 40 mg by mouth daily.    . metoprolol tartrate (LOPRESSOR) 25 MG tablet Take 1 tablet (25 mg total) by mouth every 6 (six) hours. 120 tablet 1  . ondansetron (ZOFRAN-ODT) 8 MG disintegrating tablet Take 1 tablet (8 mg total) by mouth every 8 (eight) hours as needed for nausea. 30 tablet 0  . oxyCODONE-acetaminophen (PERCOCET/ROXICET) 5-325 MG per tablet Take by mouth.    . pantoprazole (PROTONIX) 40 MG tablet Take 40 mg by mouth daily.    . prednisoLONE acetate (PRED FORTE) 1 % ophthalmic suspension Administer 1 drop into the left eye Four (4) times a day.    . ranitidine (ZANTAC) 150 MG tablet Take 150 mg by mouth 2 (two) times daily.    Marland Kitchen warfarin (COUMADIN) 1 MG tablet     . warfarin (COUMADIN) 5 MG tablet      No current facility-administered medications for this visit.   Facility-Administered Medications Ordered in Other Visits  Medication Dose Route Frequency Provider Last Rate Last Dose  . 0.9 %  sodium chloride infusion   Intravenous Once Lloyd Huger, MD        OBJECTIVE: Filed Vitals:   11/12/14 1111  BP: 153/84  Pulse: 69  Temp: 97.8 F (36.6 C)  Resp: 16     There is no weight on file to calculate BMI.    ECOG FS:3 - Symptomatic, >50% confined to bed  General: Thin, no acute distress. Eyes: Left eye injected secondary to retained suture from surgery. Anicteric sclera. Lungs: Clear to auscultation bilaterally. Heart: Regular rate and rhythm. No rubs, murmurs, or gallops. Abdomen: Soft, nontender, nondistended. No organomegaly noted, normoactive bowel sounds. Musculoskeletal: No edema, cyanosis, or clubbing. Neuro: Lethargic. Skin: No rashes or petechiae noted. Psych: Lethargic    LAB RESULTS:  Lab Results  Component Value Date   NA 119* 11/12/2014   K 5.6* 11/12/2014   CL 98* 11/12/2014   CO2 17* 11/12/2014   GLUCOSE 425* 11/12/2014   BUN 60* 11/12/2014   CREATININE 1.62* 11/12/2014   CALCIUM  8.7* 11/12/2014   PROT >12.0* 11/05/2014   ALBUMIN 3.2* 11/05/2014   AST 27 11/05/2014   ALT 23 11/05/2014   ALKPHOS 52 11/05/2014   BILITOT 0.8 11/05/2014   GFRNONAA 40* 11/12/2014   GFRAA 46* 11/12/2014    Lab Results  Component Value Date   WBC 5.8 11/12/2014   NEUTROABS 3.7 11/12/2014   HGB 7.3* 11/12/2014   HCT 21.5* 11/12/2014   MCV 90.1 11/12/2014   PLT 38* 11/12/2014     STUDIES: Ct Abdomen Pelvis Wo Contrast  10/13/2014   CLINICAL DATA:  Abdominal pain for 24 hr. Acute renal failure. History of progressive multiple myeloma last seen in Oncology 09/16/2014. Congestive heart failure. History of COPD, GERD.  EXAM: CT ABDOMEN AND PELVIS WITHOUT CONTRAST  TECHNIQUE: Multidetector CT imaging of the abdomen and pelvis was performed following the standard protocol without IV contrast.  COMPARISON:  03/11/2013  FINDINGS: Lower chest: Mild scarring at the left lung base. There are emphysematous changes in within the lung bases. Heart size is normal.  Upper abdomen: There is a small amount of perihepatic fluid. Liver otherwise is normal in appearance. No focal abnormality identified within the spleen, pancreas, or adrenal glands. Kidneys have a normal appearance. No intrarenal or ureteral stones. Gallbladder is present.  Gastrointestinal tract: Marked thickening of numerous small bowel loops, associated with mesenteric fluid especially within the right lower quadrant. There is no associated obstruction. There are skip areas of normal appearing small bowel, suggesting involvement by Crohn's disease. The stomach has a normal appearance. There are colonic diverticula. Although there is a small amount of fluid around the colon there is no evidence for inflammation arising from the colon. Appendix is normal.  There is hazy density within the mesentery, associated with small but numerous lymph nodes as seen on prior study. Postoperative clips are identified within the mesenteric.  Pelvis: Free pelvic  fluid is present. The urinary bladder has a normal appearance. Prostate and seminal vesicles are normal in appearance. No pelvic adenopathy.  Retroperitoneum: Small, nonspecific retroperitoneal lymph nodes.  Abdominal wall: Unremarkable.  Osseous structures: Degenerative changes are seen in the lower spine. No suspicious lytic or blastic lesions identified.  IMPRESSION: 1. Significant abnormality of multiple small bowel loops, associated with significant mesenteric fluid especially involving the right lower quadrant loops. Considerations include amyloidosis, Crohn's disease, infectious or inflammatory etiologies, lymphoma, vasculitis. 2. Colonic diverticulosis.  No diverticulitis. 3. Small amount of pelvic ascites and perihepatic fluid. 4. Hazy density within the mesentery associated with small, numerous lymph nodes. Findings are possibly inflammatory and appear relatively stable. 5. No abscess.   Electronically Signed   By: Nolon Nations M.D.   On: 10/13/2014 17:10   US Renal  10/19/2014   CLINICAL DATA:  Acute renal failure, history atrial fibrillation, hypertension, multiple myeloma, coronary artery disease, CHF, diabetes  EXAM: RENAL / URINARY TRACT ULTRASOUND COMPLETE  COMPARISON:  CT abdomen and pelvis 10/13/2014  FINDINGS: Right Kidney:  Length: 10.7 cm. Normal cortical thickness. Upper normal cortical echogenicity. No mass, hydronephrosis or shadowing calcification. No perinephric fluid.  Left Kidney:  Length: 10.6 cm. Normal cortical thickness with upper normal cortical echogenicity. No mass, hydronephrosis or shadowing calcification. No perinephric fluid.  Bladder:  Normal appearance.  IMPRESSION: No renal sonographic abnormalities identified.   Electronically Signed   By: Lavonia Dana M.D.   On: 10/19/2014 15:43    ASSESSMENT: Progressive multiple myeloma, FISH with 13q deletion, which is considered poor prognosis.   PLAN:    1.  Multiple myeloma: After lengthy discussion with patient's wife,  it was determined that any further chemotherapy would be detrimental given his declining performance status. Patient has agreed to enroll in hospice. No further follow-up has been scheduled..  2.  Hypertension: Continue current medications. 3.  Hypokalemia: Patient was given dietary recommendations. 4.  Anemia: Chronic and multifactorial. Secondary to underlying multiple myeloma as well as chemotherapy. Monitor. 5.  Thrombocytopenia: Likely secondary to progression of disease.   6.  Poor appetite: Continue Megace.  7.  Chronic renal insufficiency: Creatinine is stable today, monitor.  Patient will also receive 1 L IV fluids today. 8.  Diarrhea: Improved. Continue Imodium OTC. 9. Hyponatremia: Secondary to progression of disease as well as IV fluids. Will limit amount of IV fluids patient receives while on hospice.  Approximate 30 minutes was spent in discussion and consultation.   Patient expressed understanding and was in agreement with this plan. He also understands that He can call clinic at any time with any questions, concerns, or complaints.   No matching staging information was found for the patient.  Lloyd Huger, MD   11/12/2014 2:49 PM

## 2014-11-12 NOTE — Progress Notes (Signed)
[place]  [phone]) [phone] [phone]) [phone]  ID: Peter Walters OB: June 12, 1936  MR#: [id number]  LKT#:[id number]  Patient Care Team: Petra Kuba, MD as PCP - General (Family Medicine)  CHIEF COMPLAINT:  Chief Complaint  Patient presents with  . Follow-up    Multiple myeloma    INTERVAL HISTORY: Patient returns to clinic for further evaluation and treatment planning. His weakness and fatigue have progressed hospital follow-up. He has a poor appetite. His eyesight is significantly worse than his left eye.  He denies any pain.  He denies any fevers. He has no neurologic complaints. He denies any chest pain or shortness of breath. He has no urinary complaints. Patient offers no further specific complaints today.    REVIEW OF SYSTEMS:   Review of Systems  Constitutional: Positive for weight loss and malaise/fatigue. Negative for fever.  Eyes: Positive for pain.    As per HPI. Otherwise, a complete review of systems is negatve.  PAST MEDICAL HISTORY: Past Medical History  Diagnosis Date  . A-fib   . Hypertension   . Benign prostatic hypertrophy   . History of gastrointestinal bleeding   . Hyperlipidemia   . History of multiple myeloma   . CAD (coronary artery disease)   . GERD (gastroesophageal reflux disease)   . CHF (congestive heart failure)   . Peptic ulcer disease   . Hyponatremia   . Multiple myeloma in relapse 10/17/2014  . Cancer     multiple myeloma  . Diabetes mellitus without complication   . 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 Cardiac catheterization    . Coronary angioplasty      [facility]  . Eye surgery      FAMILY HISTORY Family History  Problem Relation Age of Onset  . Heart disease Father   . Kidney failure Brother        ADVANCED DIRECTIVES:    HEALTH MAINTENANCE: History  Substance Use  Topics  . Smoking status: Never Smoker   . Smokeless tobacco: Not on file  . Alcohol Use: No     Colonoscopy:  PAP:  Bone density:  Lipid panel:  Allergies  Allergen Reactions  . Aspirin Hives and Other (See Comments)    Reaction:  Causes pt to bleed.   . Celecoxib Other (See Comments)    Reaction:  Causes pt to bleed.     Current Outpatient Prescriptions  Medication Sig Dispense Refill  . atorvastatin (LIPITOR) 40 MG tablet Take 40 mg by mouth at bedtime.    Marland Kitchen atropine 1 % ophthalmic ointment Administer 1 application into the left eye Two (2) times a day.    . brimonidine (ALPHAGAN) 0.2 % ophthalmic solution Administer 1 drop into the left eye Three (3) times a day.    . calcium-vitamin D (OSCAL WITH D) 500-200 MG-UNIT per tablet Take 1 tablet by mouth daily with breakfast. 30 tablet 5  . cetirizine (ZYRTEC) 10 MG tablet Take 10 mg by mouth daily.    . ciprofloxacin (CILOXAN) 0.3 % ophthalmic solution     . diltiazem (CARDIZEM CD) 240 MG 24 hr capsule Take 1 capsule (240 mg total) by mouth daily. 30 capsule 5  . dorzolamide-timolol (COSOPT) 22.3-6.8 MG/ML ophthalmic solution     . doxazosin (CARDURA) 2 MG tablet Take 2 mg by mouth at bedtime.    . feeding supplement,  GLUCERNA SHAKE, (GLUCERNA SHAKE) LIQD Take 237 mLs by mouth 2 (two) times daily with a meal. 24 Can 0  . glipiZIDE (GLUCOTROL XL) 10 MG 24 hr tablet Take 10 mg by mouth 2 (two) times daily.    . insulin detemir (LEVEMIR) 100 UNIT/ML injection Inject 15 Units into the skin at bedtime.    . lidocaine-prilocaine (EMLA) cream Apply 1 application topically once.    . loperamide (IMODIUM A-D) 2 MG tablet Take 1 tablet (2 mg total) by mouth 4 (four) times daily as needed for diarrhea or loose stools. 30 tablet 0  . magnesium oxide (MAG-OX) 400 MG tablet Take 400 mg by mouth 2 (two) times daily.    . megestrol (MEGACE) 40 MG/ML suspension Take 40 mg by mouth daily.    . metoprolol tartrate (LOPRESSOR) 25 MG tablet Take 1  tablet (25 mg total) by mouth every 6 (six) hours. 120 tablet 1  . ondansetron (ZOFRAN-ODT) 8 MG disintegrating tablet Take 1 tablet (8 mg total) by mouth every 8 (eight) hours as needed for nausea. 30 tablet 0  . oxyCODONE-acetaminophen (PERCOCET/ROXICET) 5-325 MG per tablet Take by mouth.    . pantoprazole (PROTONIX) 40 MG tablet Take 40 mg by mouth daily.    . prednisoLONE acetate (PRED FORTE) 1 % ophthalmic suspension Administer 1 drop into the left eye Four (4) times a day.    . ranitidine (ZANTAC) 150 MG tablet Take 150 mg by mouth 2 (two) times daily.    Marland Kitchen warfarin (COUMADIN) 1 MG tablet     . warfarin (COUMADIN) 5 MG tablet     . latanoprost (XALATAN) 0.005 % ophthalmic solution Administer 1 drop into the left eye nightly.     No current facility-administered medications for this visit.   Facility-Administered Medications Ordered in Other Visits  Medication Dose Route Frequency Provider Last Rate Last Dose  . 0.9 %  sodium chloride infusion   Intravenous Once Lloyd Huger, MD        OBJECTIVE: Filed Vitals:   11/05/14 1245  BP: 136/83  Pulse: 66  Temp: 96.2 F (35.7 C)  Resp: 16     There is no weight on file to calculate BMI.    ECOG FS:3 - Symptomatic, >50% confined to bed  General: Thin, no acute distress. Eyes: Left eye injected secondary to retained suture from surgery. Anicteric sclera. Lungs: Clear to auscultation bilaterally. Heart: Regular rate and rhythm. No rubs, murmurs, or gallops. Abdomen: Soft, nontender, nondistended. No organomegaly noted, normoactive bowel sounds. Musculoskeletal: No edema, cyanosis, or clubbing. Neuro: Lethargic. Skin: No rashes or petechiae noted. Psych: Lethargic    LAB RESULTS:  Lab Results  Component Value Date   NA 119* 11/12/2014   K 5.6* 11/12/2014   CL 98* 11/12/2014   CO2 17* 11/12/2014   GLUCOSE 425* 11/12/2014   BUN 60* 11/12/2014   CREATININE 1.62* 11/12/2014   CALCIUM 8.7* 11/12/2014   PROT >12.0*  11/05/2014   ALBUMIN 3.2* 11/05/2014   AST 27 11/05/2014   ALT 23 11/05/2014   ALKPHOS 52 11/05/2014   BILITOT 0.8 11/05/2014   GFRNONAA 40* 11/12/2014   GFRAA 46* 11/12/2014    Lab Results  Component Value Date   WBC 5.8 11/12/2014   NEUTROABS 3.7 11/12/2014   HGB 7.3* 11/12/2014   HCT 21.5* 11/12/2014   MCV 90.1 11/12/2014   PLT 38* 11/12/2014     STUDIES: Ct Abdomen Pelvis Wo Contrast  10/13/2014   CLINICAL DATA:  Abdominal pain  for 24 hr. Acute renal failure. History of progressive multiple myeloma last seen in Oncology 09/16/2014. Congestive heart failure. History of COPD, GERD.  EXAM: CT ABDOMEN AND PELVIS WITHOUT CONTRAST  TECHNIQUE: Multidetector CT imaging of the abdomen and pelvis was performed following the standard protocol without IV contrast.  COMPARISON:  03/11/2013  FINDINGS: Lower chest: Mild scarring at the left lung base. There are emphysematous changes in within the lung bases. Heart size is normal.  Upper abdomen: There is a small amount of perihepatic fluid. Liver otherwise is normal in appearance. No focal abnormality identified within the spleen, pancreas, or adrenal glands. Kidneys have a normal appearance. No intrarenal or ureteral stones. Gallbladder is present.  Gastrointestinal tract: Marked thickening of numerous small bowel loops, associated with mesenteric fluid especially within the right lower quadrant. There is no associated obstruction. There are skip areas of normal appearing small bowel, suggesting involvement by Crohn's disease. The stomach has a normal appearance. There are colonic diverticula. Although there is a small amount of fluid around the colon there is no evidence for inflammation arising from the colon. Appendix is normal.  There is hazy density within the mesentery, associated with small but numerous lymph nodes as seen on prior study. Postoperative clips are identified within the mesenteric.  Pelvis: Free pelvic fluid is present. The urinary  bladder has a normal appearance. Prostate and seminal vesicles are normal in appearance. No pelvic adenopathy.  Retroperitoneum: Small, nonspecific retroperitoneal lymph nodes.  Abdominal wall: Unremarkable.  Osseous structures: Degenerative changes are seen in the lower spine. No suspicious lytic or blastic lesions identified.  IMPRESSION: 1. Significant abnormality of multiple small bowel loops, associated with significant mesenteric fluid especially involving the right lower quadrant loops. Considerations include amyloidosis, Crohn's disease, infectious or inflammatory etiologies, lymphoma, vasculitis. 2. Colonic diverticulosis.  No diverticulitis. 3. Small amount of pelvic ascites and perihepatic fluid. 4. Hazy density within the mesentery associated with small, numerous lymph nodes. Findings are possibly inflammatory and appear relatively stable. 5. No abscess.   Electronically Signed   By: Nolon Nations M.D.   On: 10/13/2014 17:10   US Renal  10/19/2014   CLINICAL DATA:  Acute renal failure, history atrial fibrillation, hypertension, multiple myeloma, coronary artery disease, CHF, diabetes  EXAM: RENAL / URINARY TRACT ULTRASOUND COMPLETE  COMPARISON:  CT abdomen and pelvis 10/13/2014  FINDINGS: Right Kidney:  Length: 10.7 cm. Normal cortical thickness. Upper normal cortical echogenicity. No mass, hydronephrosis or shadowing calcification. No perinephric fluid.  Left Kidney:  Length: 10.6 cm. Normal cortical thickness with upper normal cortical echogenicity. No mass, hydronephrosis or shadowing calcification. No perinephric fluid.  Bladder:  Normal appearance.  IMPRESSION: No renal sonographic abnormalities identified.   Electronically Signed   By: Lavonia Dana M.D.   On: 10/19/2014 15:43    ASSESSMENT: Progressive multiple myeloma, FISH with 13q deletion, which is considered poor prognosis.   PLAN:    1.  Multiple myeloma: Given patient's declining performance status, we will delay any further  treatment at this time. Patient's wife wishes to see if his performance status will improve with IV fluids. Patient will receive IV fluids in clinic today and then received 3 or 4 daily doses of 1 L over the next week. He will then return to clinic in 1 week to assess whether to pursue additional treatment or pursue hospice.  2.  Hypertension: Continue current medications. 3.  Hypokalemia: Patient was given dietary recommendations. 4.  Anemia: Chronic and multifactorial. Secondary to underlying multiple  myeloma as well as chemotherapy. Monitor. 5.  Thrombocytopenia: Likely secondary to progression of disease.   6.  Poor appetite: Continue Megace.  7.  Chronic renal insufficiency: Creatinine is stable today, monitor.  Patient will also receive 1 L IV fluids today. 8.  Diarrhea: Improved. Continue Imodium OTC.  Approximate 30 minutes was spent in discussion and consultation.   Patient expressed understanding and was in agreement with this plan. He also understands that He can call clinic at any time with any questions, concerns, or complaints.   No matching staging information was found for the patient.  Lloyd Huger, MD   11/12/2014 2:38 PM

## 2014-11-14 ENCOUNTER — Emergency Department (HOSPITAL_COMMUNITY)

## 2014-11-14 ENCOUNTER — Encounter (HOSPITAL_COMMUNITY): Payer: Self-pay | Admitting: Emergency Medicine

## 2014-11-14 ENCOUNTER — Inpatient Hospital Stay (HOSPITAL_COMMUNITY)
Admission: EM | Admit: 2014-11-14 | Discharge: 2014-11-19 | DRG: 309 | Disposition: A | Discharge status: Hospice | Attending: Internal Medicine | Admitting: Internal Medicine

## 2014-11-14 ENCOUNTER — Other Ambulatory Visit: Payer: Self-pay

## 2014-11-14 DIAGNOSIS — Z8673 Personal history of transient ischemic attack (TIA), and cerebral infarction without residual deficits: Secondary | ICD-10-CM | POA: Diagnosis not present

## 2014-11-14 DIAGNOSIS — I2510 Atherosclerotic heart disease of native coronary artery without angina pectoris: Secondary | ICD-10-CM | POA: Diagnosis present

## 2014-11-14 DIAGNOSIS — N189 Chronic kidney disease, unspecified: Secondary | ICD-10-CM

## 2014-11-14 DIAGNOSIS — C9000 Multiple myeloma not having achieved remission: Secondary | ICD-10-CM

## 2014-11-14 DIAGNOSIS — Z66 Do not resuscitate: Secondary | ICD-10-CM | POA: Diagnosis present

## 2014-11-14 DIAGNOSIS — Z9861 Coronary angioplasty status: Secondary | ICD-10-CM

## 2014-11-14 DIAGNOSIS — R55 Syncope and collapse: Secondary | ICD-10-CM | POA: Insufficient documentation

## 2014-11-14 DIAGNOSIS — N179 Acute kidney failure, unspecified: Secondary | ICD-10-CM | POA: Diagnosis present

## 2014-11-14 DIAGNOSIS — I472 Ventricular tachycardia: Secondary | ICD-10-CM | POA: Diagnosis present

## 2014-11-14 DIAGNOSIS — R040 Epistaxis: Secondary | ICD-10-CM | POA: Diagnosis present

## 2014-11-14 DIAGNOSIS — H5442 Blindness, left eye, normal vision right eye: Secondary | ICD-10-CM | POA: Diagnosis present

## 2014-11-14 DIAGNOSIS — I129 Hypertensive chronic kidney disease with stage 1 through stage 4 chronic kidney disease, or unspecified chronic kidney disease: Secondary | ICD-10-CM | POA: Diagnosis present

## 2014-11-14 DIAGNOSIS — E119 Type 2 diabetes mellitus without complications: Secondary | ICD-10-CM

## 2014-11-14 DIAGNOSIS — Z9221 Personal history of antineoplastic chemotherapy: Secondary | ICD-10-CM

## 2014-11-14 DIAGNOSIS — E1122 Type 2 diabetes mellitus with diabetic chronic kidney disease: Secondary | ICD-10-CM | POA: Diagnosis not present

## 2014-11-14 DIAGNOSIS — I4729 Other ventricular tachycardia: Secondary | ICD-10-CM

## 2014-11-14 DIAGNOSIS — I4891 Unspecified atrial fibrillation: Secondary | ICD-10-CM | POA: Diagnosis not present

## 2014-11-14 DIAGNOSIS — D630 Anemia in neoplastic disease: Secondary | ICD-10-CM | POA: Diagnosis present

## 2014-11-14 DIAGNOSIS — I509 Heart failure, unspecified: Secondary | ICD-10-CM | POA: Diagnosis present

## 2014-11-14 DIAGNOSIS — C9002 Multiple myeloma in relapse: Secondary | ICD-10-CM | POA: Diagnosis present

## 2014-11-14 DIAGNOSIS — Z8249 Family history of ischemic heart disease and other diseases of the circulatory system: Secondary | ICD-10-CM | POA: Diagnosis not present

## 2014-11-14 DIAGNOSIS — E860 Dehydration: Secondary | ICD-10-CM | POA: Diagnosis not present

## 2014-11-14 DIAGNOSIS — R4182 Altered mental status, unspecified: Secondary | ICD-10-CM | POA: Diagnosis present

## 2014-11-14 DIAGNOSIS — N183 Chronic kidney disease, stage 3 (moderate): Secondary | ICD-10-CM | POA: Diagnosis present

## 2014-11-14 DIAGNOSIS — Z9289 Personal history of other medical treatment: Secondary | ICD-10-CM | POA: Insufficient documentation

## 2014-11-14 DIAGNOSIS — D6959 Other secondary thrombocytopenia: Secondary | ICD-10-CM | POA: Diagnosis present

## 2014-11-14 DIAGNOSIS — E11649 Type 2 diabetes mellitus with hypoglycemia without coma: Secondary | ICD-10-CM | POA: Diagnosis present

## 2014-11-14 DIAGNOSIS — K219 Gastro-esophageal reflux disease without esophagitis: Secondary | ICD-10-CM | POA: Diagnosis present

## 2014-11-14 DIAGNOSIS — E785 Hyperlipidemia, unspecified: Secondary | ICD-10-CM | POA: Diagnosis present

## 2014-11-14 DIAGNOSIS — I480 Paroxysmal atrial fibrillation: Principal | ICD-10-CM | POA: Diagnosis present

## 2014-11-14 LAB — CBC WITH DIFFERENTIAL/PLATELET
Basophils Absolute: 0 10*3/uL (ref 0.0–0.1)
Basophils Relative: 0 % (ref 0–1)
EOS ABS: 0 10*3/uL (ref 0.0–0.7)
EOS PCT: 0 % (ref 0–5)
HCT: 21.9 % — ABNORMAL LOW (ref 39.0–52.0)
HEMOGLOBIN: 7.4 g/dL — AB (ref 13.0–17.0)
LYMPHS ABS: 1.2 10*3/uL (ref 0.7–4.0)
Lymphocytes Relative: 17 % (ref 12–46)
MCH: 30.2 pg (ref 26.0–34.0)
MCHC: 33.8 g/dL (ref 30.0–36.0)
MCV: 89.4 fL (ref 78.0–100.0)
MONO ABS: 0.7 10*3/uL (ref 0.1–1.0)
Monocytes Relative: 11 % (ref 3–12)
Neutro Abs: 4.9 10*3/uL (ref 1.7–7.7)
Neutrophils Relative %: 72 % (ref 43–77)
PLATELETS: 38 10*3/uL — AB (ref 150–400)
RBC: 2.45 MIL/uL — AB (ref 4.22–5.81)
RDW: 16.8 % — ABNORMAL HIGH (ref 11.5–15.5)
WBC: 6.8 10*3/uL (ref 4.0–10.5)

## 2014-11-14 LAB — MAGNESIUM
MAGNESIUM: 1.9 mg/dL (ref 1.7–2.4)
MAGNESIUM: 1.9 mg/dL (ref 1.7–2.4)

## 2014-11-14 LAB — I-STAT CHEM 8, ED
BUN: 72 mg/dL — AB (ref 6–20)
CALCIUM ION: 1.24 mmol/L (ref 1.13–1.30)
Chloride: 108 mmol/L (ref 101–111)
Creatinine, Ser: 1.9 mg/dL — ABNORMAL HIGH (ref 0.61–1.24)
GLUCOSE: 393 mg/dL — AB (ref 65–99)
HCT: 24 % — ABNORMAL LOW (ref 39.0–52.0)
Hemoglobin: 8.2 g/dL — ABNORMAL LOW (ref 13.0–17.0)
POTASSIUM: 4.7 mmol/L (ref 3.5–5.1)
Sodium: 134 mmol/L — ABNORMAL LOW (ref 135–145)
TCO2: 13 mmol/L (ref 0–100)

## 2014-11-14 LAB — COMPREHENSIVE METABOLIC PANEL
ALT: 34 U/L (ref 17–63)
ANION GAP: 8 (ref 5–15)
AST: 19 U/L (ref 15–41)
Albumin: 3 g/dL — ABNORMAL LOW (ref 3.5–5.0)
Alkaline Phosphatase: 42 U/L (ref 38–126)
BUN: 68 mg/dL — AB (ref 6–20)
CALCIUM: 8.6 mg/dL — AB (ref 8.9–10.3)
CO2: 15 mmol/L — ABNORMAL LOW (ref 22–32)
Chloride: 103 mmol/L (ref 101–111)
Creatinine, Ser: 1.94 mg/dL — ABNORMAL HIGH (ref 0.61–1.24)
GFR calc Af Amer: 37 mL/min — ABNORMAL LOW (ref >60)
GFR calc non Af Amer: 32 mL/min — ABNORMAL LOW (ref >60)
GLUCOSE: 376 mg/dL — AB (ref 65–99)
POTASSIUM: 4.4 mmol/L (ref 3.5–5.1)
SODIUM: 126 mmol/L — AB (ref 135–145)
TOTAL PROTEIN: 10.7 g/dL — AB (ref 6.5–8.1)
Total Bilirubin: 1.3 mg/dL — ABNORMAL HIGH (ref 0.3–1.2)

## 2014-11-14 LAB — CBC
HCT: 21.6 % — ABNORMAL LOW (ref 39.0–52.0)
Hemoglobin: 7.4 g/dL — ABNORMAL LOW (ref 13.0–17.0)
MCH: 30.7 pg (ref 26.0–34.0)
MCHC: 34.3 g/dL (ref 30.0–36.0)
MCV: 89.6 fL (ref 78.0–100.0)
Platelets: 42 10*3/uL — ABNORMAL LOW (ref 150–400)
RBC: 2.41 MIL/uL — ABNORMAL LOW (ref 4.22–5.81)
RDW: 16.9 % — AB (ref 11.5–15.5)
WBC: 7.3 10*3/uL (ref 4.0–10.5)

## 2014-11-14 LAB — TROPONIN I
TROPONIN I: 0.05 ng/mL — AB (ref <0.031)
TROPONIN I: 0.04 ng/mL — AB (ref <0.031)
TROPONIN I: 0.04 ng/mL — AB (ref <0.031)

## 2014-11-14 LAB — MRSA PCR SCREENING: MRSA by PCR: NEGATIVE

## 2014-11-14 LAB — CALCIUM: Calcium: 8.5 mg/dL — ABNORMAL LOW (ref 8.9–10.3)

## 2014-11-14 LAB — CREATININE, SERUM
Creatinine, Ser: 1.85 mg/dL — ABNORMAL HIGH (ref 0.61–1.24)
GFR calc non Af Amer: 34 mL/min — ABNORMAL LOW (ref >60)
GFR, EST AFRICAN AMERICAN: 39 mL/min — AB (ref >60)

## 2014-11-14 LAB — TSH: TSH: 1.083 u[IU]/mL (ref 0.350–4.500)

## 2014-11-14 LAB — GLUCOSE, CAPILLARY
GLUCOSE-CAPILLARY: 363 mg/dL — AB (ref 65–99)
GLUCOSE-CAPILLARY: 347 mg/dL — AB (ref 65–99)

## 2014-11-14 LAB — PROTIME-INR
INR: 1.46 (ref 0.00–1.49)
Prothrombin Time: 17.8 seconds — ABNORMAL HIGH (ref 11.6–15.2)

## 2014-11-14 MED ORDER — GLUCERNA SHAKE PO LIQD
237.0000 mL | Freq: Two times a day (BID) | ORAL | Status: DC
  Administered 2014-11-14 – 2014-11-19 (×10): 237 mL via ORAL

## 2014-11-14 MED ORDER — MAGNESIUM OXIDE 400 (241.3 MG) MG PO TABS
400.0000 mg | ORAL_TABLET | Freq: Two times a day (BID) | ORAL | Status: DC
  Administered 2014-11-14 – 2014-11-19 (×10): 400 mg via ORAL
  Filled 2014-11-14 (×10): qty 1

## 2014-11-14 MED ORDER — DILTIAZEM HCL 100 MG IV SOLR
5.0000 mg/h | Freq: Once | INTRAVENOUS | Status: AC
  Administered 2014-11-14: 15 mg/h via INTRAVENOUS
  Filled 2014-11-14: qty 100

## 2014-11-14 MED ORDER — ONDANSETRON HCL 4 MG PO TABS
4.0000 mg | ORAL_TABLET | Freq: Four times a day (QID) | ORAL | Status: DC | PRN
Start: 2014-11-14 — End: 2014-11-19

## 2014-11-14 MED ORDER — PREDNISOLONE ACETATE 1 % OP SUSP
1.0000 [drp] | Freq: Four times a day (QID) | OPHTHALMIC | Status: DC
  Administered 2014-11-15 – 2014-11-19 (×18): 1 [drp] via OPHTHALMIC
  Filled 2014-11-14: qty 1

## 2014-11-14 MED ORDER — INSULIN ASPART 100 UNIT/ML ~~LOC~~ SOLN
0.0000 [IU] | Freq: Every day | SUBCUTANEOUS | Status: DC
  Administered 2014-11-14: 4 [IU] via SUBCUTANEOUS
  Administered 2014-11-15: 2 [IU] via SUBCUTANEOUS

## 2014-11-14 MED ORDER — MEGESTROL ACETATE 40 MG PO TABS
ORAL_TABLET | ORAL | Status: AC
  Filled 2014-11-14: qty 1

## 2014-11-14 MED ORDER — CALCIUM CARBONATE-VITAMIN D 500-200 MG-UNIT PO TABS
1.0000 | ORAL_TABLET | Freq: Every day | ORAL | Status: DC
  Administered 2014-11-15 – 2014-11-19 (×5): 1 via ORAL
  Filled 2014-11-14 (×6): qty 1

## 2014-11-14 MED ORDER — BRIMONIDINE TARTRATE 0.2 % OP SOLN
1.0000 [drp] | Freq: Three times a day (TID) | OPHTHALMIC | Status: DC
  Administered 2014-11-14 – 2014-11-19 (×15): 1 [drp] via OPHTHALMIC
  Filled 2014-11-14: qty 5

## 2014-11-14 MED ORDER — ATROPINE SULFATE 1 % OP OINT
TOPICAL_OINTMENT | Freq: Three times a day (TID) | OPHTHALMIC | Status: DC
  Administered 2014-11-15: 1 via OPHTHALMIC
  Filled 2014-11-14: qty 3.5

## 2014-11-14 MED ORDER — AMIODARONE HCL 150 MG/3ML IV SOLN
150.0000 mg | Freq: Once | INTRAVENOUS | Status: AC
  Administered 2014-11-14: 150 mg via INTRAVENOUS
  Filled 2014-11-14: qty 3

## 2014-11-14 MED ORDER — DILTIAZEM HCL 100 MG IV SOLR
5.0000 mg/h | Freq: Once | INTRAVENOUS | Status: AC
  Administered 2014-11-14: 5 mg/h via INTRAVENOUS
  Filled 2014-11-14: qty 100

## 2014-11-14 MED ORDER — SODIUM CHLORIDE 0.9 % IJ SOLN
3.0000 mL | Freq: Two times a day (BID) | INTRAMUSCULAR | Status: DC
  Administered 2014-11-14 – 2014-11-19 (×9): 3 mL via INTRAVENOUS

## 2014-11-14 MED ORDER — METOPROLOL TARTRATE 25 MG PO TABS: 25.0000 mg | ORAL_TABLET | Freq: Four times a day (QID) | ORAL | Status: DC

## 2014-11-14 MED ORDER — ONDANSETRON HCL 4 MG/2ML IJ SOLN: 4.0000 mg | Freq: Four times a day (QID) | INTRAMUSCULAR | Status: DC | PRN

## 2014-11-14 MED ORDER — ATORVASTATIN CALCIUM 40 MG PO TABS
40.0000 mg | ORAL_TABLET | Freq: Every day | ORAL | Status: DC
  Administered 2014-11-14 – 2014-11-18 (×5): 40 mg via ORAL
  Filled 2014-11-14 (×5): qty 1

## 2014-11-14 MED ORDER — LATANOPROST 0.005 % OP SOLN
OPHTHALMIC | Status: AC
  Filled 2014-11-14: qty 2.5

## 2014-11-14 MED ORDER — BRIMONIDINE TARTRATE 0.2 % OP SOLN
OPHTHALMIC | Status: AC
  Filled 2014-11-14: qty 5

## 2014-11-14 MED ORDER — AMIODARONE HCL IN DEXTROSE 360-4.14 MG/200ML-% IV SOLN
30.0000 mg/h | INTRAVENOUS | Status: DC
  Administered 2014-11-14 – 2014-11-16 (×4): 30 mg/h via INTRAVENOUS
  Filled 2014-11-14 (×5): qty 200

## 2014-11-14 MED ORDER — OXYCODONE-ACETAMINOPHEN 5-325 MG PO TABS
1.0000 | ORAL_TABLET | Freq: Four times a day (QID) | ORAL | Status: DC | PRN
  Administered 2014-11-16 – 2014-11-19 (×3): 1 via ORAL
  Filled 2014-11-14 (×3): qty 1

## 2014-11-14 MED ORDER — AMIODARONE HCL IN DEXTROSE 360-4.14 MG/200ML-% IV SOLN
60.0000 mg/h | INTRAVENOUS | Status: AC
  Administered 2014-11-14: 60 mg/h via INTRAVENOUS

## 2014-11-14 MED ORDER — DORZOLAMIDE HCL-TIMOLOL MAL 2-0.5 % OP SOLN
OPHTHALMIC | Status: AC
  Filled 2014-11-14: qty 10

## 2014-11-14 MED ORDER — SODIUM CHLORIDE 0.9 % IV BOLUS (SEPSIS)
500.0000 mL | Freq: Once | INTRAVENOUS | Status: AC
  Administered 2014-11-14: 500 mL via INTRAVENOUS

## 2014-11-14 MED ORDER — LATANOPROST 0.005 % OP SOLN
1.0000 [drp] | Freq: Every day | OPHTHALMIC | Status: DC
  Administered 2014-11-14 – 2014-11-18 (×5): 1 [drp] via OPHTHALMIC
  Filled 2014-11-14: qty 2.5

## 2014-11-14 MED ORDER — HEPARIN SODIUM (PORCINE) 5000 UNIT/ML IJ SOLN
5000.0000 [IU] | Freq: Three times a day (TID) | INTRAMUSCULAR | Status: DC
  Administered 2014-11-14 – 2014-11-19 (×15): 5000 [IU] via SUBCUTANEOUS
  Filled 2014-11-14 (×14): qty 1

## 2014-11-14 MED ORDER — MAGNESIUM OXIDE 400 MG PO TABS: 400.0000 mg | ORAL_TABLET | Freq: Two times a day (BID) | ORAL | Status: DC

## 2014-11-14 MED ORDER — INSULIN ASPART 100 UNIT/ML ~~LOC~~ SOLN
0.0000 [IU] | Freq: Three times a day (TID) | SUBCUTANEOUS | Status: DC
  Administered 2014-11-15 (×2): 3 [IU] via SUBCUTANEOUS
  Administered 2014-11-16: 1 [IU] via SUBCUTANEOUS
  Administered 2014-11-16: 2 [IU] via SUBCUTANEOUS
  Administered 2014-11-16: 1 [IU] via SUBCUTANEOUS
  Administered 2014-11-17 – 2014-11-18 (×2): 2 [IU] via SUBCUTANEOUS

## 2014-11-14 MED ORDER — GLIPIZIDE ER 5 MG PO TB24
10.0000 mg | ORAL_TABLET | Freq: Two times a day (BID) | ORAL | Status: DC
  Administered 2014-11-15 – 2014-11-18 (×5): 10 mg via ORAL
  Filled 2014-11-14: qty 2
  Filled 2014-11-14 (×3): qty 1
  Filled 2014-11-14: qty 2
  Filled 2014-11-14: qty 1
  Filled 2014-11-14 (×3): qty 2
  Filled 2014-11-14 (×3): qty 1
  Filled 2014-11-14: qty 2

## 2014-11-14 MED ORDER — PANTOPRAZOLE SODIUM 40 MG PO TBEC
40.0000 mg | DELAYED_RELEASE_TABLET | Freq: Every day | ORAL | Status: DC
  Administered 2014-11-14 – 2014-11-19 (×6): 40 mg via ORAL
  Filled 2014-11-14 (×6): qty 1

## 2014-11-14 MED ORDER — METOPROLOL TARTRATE 1 MG/ML IV SOLN
5.0000 mg | Freq: Four times a day (QID) | INTRAVENOUS | Status: DC
  Administered 2014-11-15 – 2014-11-16 (×6): 5 mg via INTRAVENOUS
  Filled 2014-11-14 (×7): qty 5

## 2014-11-14 MED ORDER — INSULIN DETEMIR 100 UNIT/ML ~~LOC~~ SOLN
15.0000 [IU] | Freq: Every day | SUBCUTANEOUS | Status: DC
  Administered 2014-11-14: 15 [IU] via SUBCUTANEOUS
  Filled 2014-11-14 (×4): qty 0.15

## 2014-11-14 MED ORDER — MEGESTROL ACETATE 400 MG/10ML PO SUSP
40.0000 mg | Freq: Every day | ORAL | Status: DC
  Administered 2014-11-15 – 2014-11-19 (×5): 40 mg via ORAL
  Filled 2014-11-14 (×3): qty 10
  Filled 2014-11-14 (×2): qty 5
  Filled 2014-11-14 (×2): qty 10

## 2014-11-14 MED ORDER — DORZOLAMIDE HCL-TIMOLOL MAL 2-0.5 % OP SOLN
1.0000 [drp] | Freq: Two times a day (BID) | OPHTHALMIC | Status: DC
  Administered 2014-11-14 – 2014-11-19 (×10): 1 [drp] via OPHTHALMIC
  Filled 2014-11-14: qty 10

## 2014-11-14 MED ORDER — SODIUM CHLORIDE 0.9 % IV SOLN: INTRAVENOUS | Status: DC

## 2014-11-14 MED ORDER — SODIUM CHLORIDE 0.9 % IV SOLN
INTRAVENOUS | Status: DC
  Administered 2014-11-14 – 2014-11-18 (×7): via INTRAVENOUS
  Administered 2014-11-19: 1 mL via INTRAVENOUS
  Administered 2014-11-19: 11:00:00 via INTRAVENOUS

## 2014-11-14 MED ORDER — METOPROLOL TARTRATE 1 MG/ML IV SOLN
5.0000 mg | Freq: Four times a day (QID) | INTRAVENOUS | Status: DC
  Administered 2014-11-14: 5 mg via INTRAVENOUS

## 2014-11-14 NOTE — H&P (Signed)
[facility] History and Physical  Peter Walters VXB:[id number] DOB: 22-May-1936 DOA: 11/14/2014  Referring physician: ER, Dr. Alvino Chapel PCP: Petra Kuba, MD   Chief Complaint: Andre Lefort state.  HPI: Peter Walters is a 79 y.o. male  This is a 79 year old man who appears to have endstage myeloma. He is unable to you me any clear history as he says he doesn't know what happened to him. Apparently, he was brought to the emergency room via EMS after having been found unresponsive in his home. He has a history of atrial fibrillation in the past, hypertension, hyperlipidemia, coronary artery disease and congestive heart failure. 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 He is currently on chemotherapy for myeloma but there is question as to how long this can be continued. When he presented to the emergency room he was in atrial fibrillation with rapid ventricular response. The ventricular rate was as high as 180. He has been started on intravenous Cardizem drip and also amiodarone bolus followed by amiodarone drip. The patient denies chest pain, palpitations, dyspnea. Cardiology has been consulted. Is now being admitted for further management.   Review of Systems:  Apart from symptoms above, all systems negative.   Past Medical History  Diagnosis Date  . A-fib   . Hypertension   . Benign prostatic hypertrophy   . History of gastrointestinal bleeding   . Hyperlipidemia   . History of multiple myeloma   . CAD (coronary artery disease)   . GERD (gastroesophageal reflux disease)   . CHF (congestive heart failure)   . Peptic ulcer disease   . Hyponatremia   . Multiple myeloma in relapse 10/17/2014  . Cancer     multiple myeloma  . Diabetes mellitus without complication   . 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 Cardiac catheterization    . Coronary angioplasty      [facility]   . Eye surgery     Social History:  reports that he has never smoked. He does not have any smokeless tobacco history on file. He reports that he does not drink alcohol or use illicit drugs.  Allergies  Allergen Reactions  . Aspirin Hives and Other (See Comments)    Reaction:  Causes pt to bleed.   . Celecoxib Other (See Comments)    Reaction:  Causes pt to bleed.     Family History  Problem Relation Age of Onset  . Heart disease Father   . Kidney failure Brother     Prior to Admission medications   Medication Sig Start Date End Date Taking? Authorizing Provider  atorvastatin (LIPITOR) 40 MG tablet Take 40 mg by mouth at bedtime.   Yes Historical Provider, MD  atropine 1 % ophthalmic ointment Administer 1 application into the left eye Two (2) times a day. 11/01/14 11/01/15 Yes Historical Provider, MD  brimonidine (ALPHAGAN) 0.2 % ophthalmic solution Administer 1 drop into the left eye Three (3) times a day. 10/30/14  Yes Historical Provider, MD  calcium-vitamin D (OSCAL WITH D) 500-200 MG-UNIT per tablet Take 1 tablet by mouth daily with breakfast. 10/24/14  Yes Lloyd Huger, MD  diltiazem (CARDIZEM CD) 240 MG 24 hr capsule Take 1 capsule (240 mg total) by mouth daily. 10/24/14  Yes Lloyd Huger, MD  dorzolamide-timolol (COSOPT) 22.3-6.8 MG/ML ophthalmic solution Place 1 drop into the left eye 2 (two) times daily.  10/30/14  Yes Historical Provider, MD  doxazosin (CARDURA) 2 MG tablet Take 2 mg by mouth at bedtime.   Yes Historical Provider, MD  feeding supplement, GLUCERNA SHAKE, (GLUCERNA SHAKE) LIQD Take 237 mLs by mouth 2 (two) times daily with a meal. 10/24/14  Yes Lloyd Huger, MD  glipiZIDE (GLUCOTROL XL) 10 MG 24 hr tablet Take 10 mg by mouth 2 (two) times daily.   Yes Historical Provider, MD  insulin detemir (LEVEMIR) 100 UNIT/ML injection Inject 15 Units into the skin at bedtime.   Yes Historical Provider, MD  latanoprost (XALATAN) 0.005 % ophthalmic solution  Administer 1 drop into the left eye nightly. 11/08/14 11/08/15 Yes Historical Provider, MD  magnesium oxide (MAG-OX) 400 MG tablet Take 400 mg by mouth 2 (two) times daily.   Yes Historical Provider, MD  megestrol (MEGACE) 40 MG/ML suspension Take 40 mg by mouth daily.   Yes Historical Provider, MD  metoprolol tartrate (LOPRESSOR) 25 MG tablet Take 1 tablet (25 mg total) by mouth every 6 (six) hours. 10/24/14  Yes Lloyd Huger, MD  oxyCODONE-acetaminophen (PERCOCET/ROXICET) 5-325 MG per tablet Take 1 tablet by mouth every 6 (six) hours as needed (pain).  10/30/14 11/19/14 Yes Historical Provider, MD  pantoprazole (PROTONIX) 40 MG tablet Take 40 mg by mouth daily.   Yes Historical Provider, MD  prednisoLONE acetate (PRED FORTE) 1 % ophthalmic suspension Administer 1 drop into the left eye Four (4) times a day. 11/01/14 12/01/14 Yes Historical Provider, MD  ranitidine (ZANTAC) 150 MG tablet Take 150 mg by mouth 2 (two) times daily.   Yes Historical Provider, MD  lidocaine-prilocaine (EMLA) cream Apply 1 application topically once.    Historical Provider, MD  loperamide (IMODIUM A-D) 2 MG tablet Take 1 tablet (2 mg total) by mouth 4 (four) times daily as needed for diarrhea or loose stools. 10/16/14   Sheryl L Gottlieb, DO  ondansetron (ZOFRAN-ODT) 8 MG disintegrating tablet Take 1 tablet (8 mg total) by mouth every 8 (eight) hours as needed for nausea. 10/13/14   Boris Lown, DO   Physical Exam: Filed Vitals:   11/14/14 1530 11/14/14 1600 11/14/14 1630 11/14/14 1700  BP: 132/73 126/65 141/90 122/77  Pulse:    80  Temp:      TempSrc:      Resp: 20 20  17   Weight:      SpO2:   99% 100%    Wt Readings from Last 3 Encounters:  11/14/14 75.297 kg (166 lb)  10/24/14 75.388 kg (166 lb 3.2 oz)  10/13/14 73.936 kg (163 lb)    General:  Appears somewhat drowsy. Eyes: PERRL, normal lids, irises & conjunctiva ENT: grossly normal hearing, lips & tongue Neck: no LAD, masses or  thyromegaly Cardiovascular: Irregularly irregular, consistent with atrial fibrillation. There are no clinical signs of heart failure. Telemetry: Atrial fibrillation with rapid ventricular response. The ventricular rate is now in the 120s. This is an improvement. Respiratory: CTA bilaterally, no w/r/r. Normal respiratory effort. Abdomen: soft, ntnd Skin: no rash or induration seen on limited exam Musculoskeletal: grossly normal tone BUE/BLE Psychiatric: grossly normal mood and affect, speech fluent and appropriate Neurologic: grossly non-focal.          Labs on Admission:  Basic Metabolic Panel:  Recent Labs Lab 11/12/14 0956 11/14/14 1318 11/14/14 1324  NA 119* 126* 134*  K 5.6* 4.4 4.7  CL 98* 103 108  CO2 17* 15*  --   GLUCOSE 425* 376* 393*  BUN 60* 68*  72*  CREATININE 1.62* 1.94* 1.90*  CALCIUM 8.7* 8.6*  --   MG  --  1.9  --    Liver Function Tests:  Recent Labs Lab 11/14/14 1318  AST 19  ALT 34  ALKPHOS 42  BILITOT 1.3*  PROT 10.7*  ALBUMIN 3.0*   No results for input(s): LIPASE, AMYLASE in the last 168 hours. No results for input(s): AMMONIA in the last 168 hours. CBC:  Recent Labs Lab 11/12/14 0956 11/14/14 1318 11/14/14 1324  WBC 5.8 6.8  --   NEUTROABS 3.7 4.9  --   HGB 7.3* 7.4* 8.2*  HCT 21.5* 21.9* 24.0*  MCV 90.1 89.4  --   PLT 38* 38*  --    Cardiac Enzymes:  Recent Labs Lab 11/14/14 1318  TROPONINI 0.05*    BNP (last 3 results) No results for input(s): BNP in the last 8760 hours.  ProBNP (last 3 results) No results for input(s): PROBNP in the last 8760 hours.  CBG: No results for input(s): GLUCAP in the last 168 hours.  Radiological Exams on Admission: Ct Head Wo Contrast  11/14/2014   CLINICAL DATA:  Unresponsive.  Undergoing chemotherapy for myeloma.  EXAM: CT HEAD WITHOUT CONTRAST  TECHNIQUE: Contiguous axial images were obtained from the base of the skull through the vertex without intravenous contrast.  COMPARISON:   None.  FINDINGS: Mild atrophy with diffuse sulcal prominence and prominence of the bifrontal extra-axial spaces. Scattered minimal periventricular hypodensities suggestive of microvascular ischemic disease. Bilateral basal ganglial calcifications. No CT evidence of acute large territory infarct. No intraparenchymal or extra-axial mass or hemorrhage. Normal size and configuration of the ventricles and basilar cisterns. No midline shift. Intracranial atherosclerosis. Limited visualization of the paranasal sinuses and mastoid air cells is normal. No air-fluid levels. Regional soft tissues appear normal. Post bilateral cataract surgery. No displaced calvarial fracture.  IMPRESSION: Atrophy and microvascular ischemic disease without acute intracranial process.   Electronically Signed   By: Sandi Mariscal M.D.   On: 11/14/2014 16:41   Dg Chest Portable 1 View  11/14/2014   CLINICAL DATA:  Multiple myeloma.  Atrial fibrillation  EXAM: PORTABLE CHEST - 1 VIEW  COMPARISON:  December 17, 2013  FINDINGS: There is no edema or consolidation. No mass or adenopathy. Heart size and pulmonary vascularity are normal. Port-A-Cath tip is in the superior vena cava. No pneumothorax. No blastic or lytic bone lesions are appreciable.  IMPRESSION: No edema or consolidation.   Electronically Signed   By: Lowella Grip III M.D.   On: 11/14/2014 13:37    EKG: Independently reviewed. Atrial fibrillation with rapid interval response. No acute ST-T wave changes.  Assessment/Plan   1. Atrial fibrillation with rapid ventricle response. He is already on Cardizem and amiodarone drips. Cardiology is aware of this and will formally consult. He may need cardioversion. Cardioversion was attempted by EMS I believe but was not completely successful. 2. Multiple myeloma in relapse. He continues on chemotherapy for this apparently intermittently. 3. Diabetes. Continue with home medications and sliding scale insulin. 4. Chronic kidney disease. His  creatinine is likely at baseline. Monitor closely. 5. 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 Stable.  Further recommendations will depend on patient's hospital progress.   Code Status: DO NOT RESUSCITATE.   DVT Prophylaxis: Heparin.  Family Communication: No family members present at the bedside.   Disposition Plan: Depending on progress.  Time spent: 60 minutes.  Doree Albee [facility] Pager [phone].

## 2014-11-14 NOTE — ED Notes (Signed)
EMAS gave adenosin 6mg , 12 mg and cardizem 10 mg x 4  Cardioverion @ 120 J

## 2014-11-14 NOTE — ED Notes (Signed)
Family states pt is weak, walks with a cane, had not been up out of bed today. Pt complaining of Headache, denies other pain

## 2014-11-14 NOTE — ED Notes (Signed)
EMS called out, HR 240 pt unresponsive at home,  EMS gave meds, pt is responsive, alert and oriented at present

## 2014-11-14 NOTE — ED Provider Notes (Addendum)
CSN: [id number]     Arrival date & time 11/14/14  1235 History  This chart was scribed for Peter Belling, MD by Mercy Moore, ED scribe.  This patient was seen in room APA18/APA18 and the patient's care was started at 12:42 PM.   Chief Complaint  Patient presents with  . Atrial Fibrillation   Level 5 Caveat: Condition of Patient The history is provided by the EMS personnel and the patient. No language interpreter was used.   HPI Comments: Peter Walters is a 79 y.o. male with PMHx including atrial fibrillation, Hypertension, Hyperlipidemia, myeloma, CAD, CHF, Hyponatremia, Diabetes Mellitus, and stroke (left eye ischemic stroke) brought in by ambulance, who presents to the Emergency Department after being found unresponsive in his home. Patient reports having pain in his head for a few months now. Patient with elevated bp measured in route to ED reports sensation of palpitations with mild chest pain. Patient currently on chemo for his myeloma. He presents with accessed portacath. Patient unable to provide details concerning this.    Past Medical History  Diagnosis Date  . A-fib   . Hypertension   . Benign prostatic hypertrophy   . History of gastrointestinal bleeding   . Hyperlipidemia   . History of multiple myeloma   . CAD (coronary artery disease)   . GERD (gastroesophageal reflux disease)   . CHF (congestive heart failure)   . Peptic ulcer disease   . Hyponatremia   . Multiple myeloma in relapse 10/17/2014  . Cancer     multiple myeloma  . Diabetes mellitus without complication   . 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 Cardiac catheterization    . Coronary angioplasty      [facility]  . Eye surgery     Family History  Problem Relation Age of Onset  . Heart disease Father   . Kidney failure Brother    History  Substance Use Topics  . Smoking status: Never Smoker   . Smokeless tobacco:  Not on file  . Alcohol Use: No    Review of Systems  Unable to perform ROS: Other      Allergies  Aspirin and Celecoxib  Home Medications   Prior to Admission medications   Medication Sig Start Date End Date Taking? Authorizing Provider  atorvastatin (LIPITOR) 40 MG tablet Take 40 mg by mouth at bedtime.   Yes Historical Provider, MD  atropine 1 % ophthalmic ointment Administer 1 application into the left eye Two (2) times a day. 11/01/14 11/01/15 Yes Historical Provider, MD  brimonidine (ALPHAGAN) 0.2 % ophthalmic solution Administer 1 drop into the left eye Three (3) times a day. 10/30/14  Yes Historical Provider, MD  calcium-vitamin D (OSCAL WITH D) 500-200 MG-UNIT per tablet Take 1 tablet by mouth daily with breakfast. 10/24/14  Yes Lloyd Huger, MD  diltiazem (CARDIZEM CD) 240 MG 24 hr capsule Take 1 capsule (240 mg total) by mouth daily. 10/24/14  Yes Lloyd Huger, MD  dorzolamide-timolol (COSOPT) 22.3-6.8 MG/ML ophthalmic solution Place 1 drop into the left eye 2 (two) times daily.  10/30/14  Yes Historical Provider, MD  doxazosin (CARDURA) 2 MG tablet Take 2 mg by mouth at bedtime.   Yes Historical Provider, MD  feeding supplement, GLUCERNA SHAKE, (GLUCERNA SHAKE) LIQD Take 237 mLs by mouth 2 (two) times daily with a meal. 10/24/14  Yes Lloyd Huger, MD  glipiZIDE (GLUCOTROL XL) 10 MG 24 hr tablet Take 10 mg by mouth 2 (two) times daily.   Yes Historical Provider, MD  insulin detemir (LEVEMIR) 100 UNIT/ML injection Inject 15 Units into the skin at bedtime.   Yes Historical Provider, MD  latanoprost (XALATAN) 0.005 % ophthalmic solution Administer 1 drop into the left eye nightly. 11/08/14 11/08/15 Yes Historical Provider, MD  magnesium oxide (MAG-OX) 400 MG tablet Take 400 mg by mouth 2 (two) times daily.   Yes Historical Provider, MD  megestrol (MEGACE) 40 MG/ML suspension Take 40 mg by mouth daily.   Yes Historical Provider, MD  metoprolol tartrate (LOPRESSOR) 25 MG  tablet Take 1 tablet (25 mg total) by mouth every 6 (six) hours. 10/24/14  Yes Lloyd Huger, MD  oxyCODONE-acetaminophen (PERCOCET/ROXICET) 5-325 MG per tablet Take 1 tablet by mouth every 6 (six) hours as needed (pain).  10/30/14 11/19/14 Yes Historical Provider, MD  pantoprazole (PROTONIX) 40 MG tablet Take 40 mg by mouth daily.   Yes Historical Provider, MD  prednisoLONE acetate (PRED FORTE) 1 % ophthalmic suspension Administer 1 drop into the left eye Four (4) times a day. 11/01/14 12/01/14 Yes Historical Provider, MD  ranitidine (ZANTAC) 150 MG tablet Take 150 mg by mouth 2 (two) times daily.   Yes Historical Provider, MD  lidocaine-prilocaine (EMLA) cream Apply 1 application topically once.    Historical Provider, MD  loperamide (IMODIUM A-D) 2 MG tablet Take 1 tablet (2 mg total) by mouth 4 (four) times daily as needed for diarrhea or loose stools. 10/16/14   Sheryl L Gottlieb, DO  ondansetron (ZOFRAN-ODT) 8 MG disintegrating tablet Take 1 tablet (8 mg total) by mouth every 8 (eight) hours as needed for nausea. 10/13/14   Sheryl L Gottlieb, DO   BP 132/73 mmHg  Pulse 175  Temp(Src) 97.6 F (36.4 C) (Oral)  Resp 20  Wt 166 lb (75.297 kg)  SpO2 100% Physical Exam  Constitutional: He is oriented to person, place, and time. He appears well-developed and well-nourished. No distress.  Somnolent.   HENT:  Head: Normocephalic and atraumatic.  Eyes: EOM are normal.  Right pupil reactive  Left pupil chronically blind  Neck: Neck supple. No tracheal deviation present.  Cardiovascular: Tachycardia present.   Pulmonary/Chest: Effort normal. No respiratory distress.  Musculoskeletal: Normal range of motion.  Neurological: He is alert and oriented to person, place, and time.  Skin: Skin is warm and dry.  Psychiatric: He has a normal mood and affect. His behavior is normal.  Nursing note and vitals reviewed.   ED Course  Procedures (including critical care time)  COORDINATION OF CARE: 12:51  PM- Discussed treatment plan with patient at bedside and patient agreed to plan.   Labs Review Labs Reviewed  COMPREHENSIVE METABOLIC PANEL - Abnormal; Notable for the following:    Sodium 126 (*)    CO2 15 (*)    Glucose, Bld 376 (*)    BUN 68 (*)    Creatinine, Ser 1.94 (*)    Calcium 8.6 (*)    Total Protein 10.7 (*)    Albumin 3.0 (*)    Total Bilirubin 1.3 (*)    GFR calc non Af Amer 32 (*)    GFR calc Af Amer 37 (*)    All other components within normal limits  CBC WITH DIFFERENTIAL/PLATELET - Abnormal; Notable for the following:    RBC 2.45 (*)    Hemoglobin 7.4 (*)    HCT 21.9 (*)    RDW 16.8 (*)  Platelets 38 (*)    All other components within normal limits  PROTIME-INR - Abnormal; Notable for the following:    Prothrombin Time 17.8 (*)    All other components within normal limits  TROPONIN I - Abnormal; Notable for the following:    Troponin I 0.05 (*)    All other components within normal limits  I-STAT CHEM 8, ED - Abnormal; Notable for the following:    Sodium 134 (*)    BUN 72 (*)    Creatinine, Ser 1.90 (*)    Glucose, Bld 393 (*)    Hemoglobin 8.2 (*)    HCT 24.0 (*)    All other components within normal limits  MAGNESIUM  URINALYSIS, ROUTINE W REFLEX MICROSCOPIC (NOT AT [facility])    Imaging Review Ct Head Wo Contrast  11/14/2014   CLINICAL DATA:  Unresponsive.  Undergoing chemotherapy for myeloma.  EXAM: CT HEAD WITHOUT CONTRAST  TECHNIQUE: Contiguous axial images were obtained from the base of the skull through the vertex without intravenous contrast.  COMPARISON:  None.  FINDINGS: Mild atrophy with diffuse sulcal prominence and prominence of the bifrontal extra-axial spaces. Scattered minimal periventricular hypodensities suggestive of microvascular ischemic disease. Bilateral basal ganglial calcifications. No CT evidence of acute large territory infarct. No intraparenchymal or extra-axial mass or hemorrhage. Normal size and configuration of the ventricles  and basilar cisterns. No midline shift. Intracranial atherosclerosis. Limited visualization of the paranasal sinuses and mastoid air cells is normal. No air-fluid levels. Regional soft tissues appear normal. Post bilateral cataract surgery. No displaced calvarial fracture.  IMPRESSION: Atrophy and microvascular ischemic disease without acute intracranial process.   Electronically Signed   By: Sandi Mariscal M.D.   On: 11/14/2014 16:41   Dg Chest Portable 1 View  11/14/2014   CLINICAL DATA:  Multiple myeloma.  Atrial fibrillation  EXAM: PORTABLE CHEST - 1 VIEW  COMPARISON:  December 17, 2013  FINDINGS: There is no edema or consolidation. No mass or adenopathy. Heart size and pulmonary vascularity are normal. Port-A-Cath tip is in the superior vena cava. No pneumothorax. No blastic or lytic bone lesions are appreciable.  IMPRESSION: No edema or consolidation.   Electronically Signed   By: Lowella Grip III M.D.   On: 11/14/2014 13:37     EKG Interpretation   Date/Time:  Thursday November 14 2014 12:40:28 EDT Ventricular Rate:  183 PR Interval:    QRS Duration: 114 QT Interval:  319 QTC Calculation: 557 R Axis:   77 Text Interpretation:  Atrial fibrillation with rapid V-rate Ventricular  tachycardia, unsustained RSR' in V1 or V2, probably normal variant  Probable LVH with secondary repol abnrm Confirmed by Alvino Chapel  MD, Adali Pennings  [phone]) on 11/14/2014 4:43:48 PM      MDM   Final diagnoses:  Atrial fibrillation with rapid ventricular response  Multiple myeloma    Patient presents with A. fib RVR. Had adenosine Cardizem and attempted cardioversion by EMS. None were successful. Started on Cardizem drip and then amiodarone with no rate control. Lab work overall near baseline. Discussed with cardiology and will increase Cardizem drip and add a second 1 will admit to internal medicine to a step down bed. 50 bolus of amiodarone. May require another cardioversion.   I personally performed the services  described in this documentation, which was scribed in my presence. The recorded information has been reviewed and is accurate.     Peter Belling, MD 11/14/14 1645  CRITICAL CARE Performed by: Mackie Pai Total critical care time:  35 Critical care time was exclusive of separately billable procedures and treating other patients. Critical care was necessary to treat or prevent imminent or life-threatening deterioration. Critical care was time spent personally by me on the following activities: development of treatment plan with patient and/or surrogate as well as nursing, discussions with consultants, evaluation of patient's response to treatment, examination of patient, obtaining history from patient or surrogate, ordering and performing treatments and interventions, ordering and review of laboratory studies, ordering and review of radiographic studies, pulse oximetry and re-evaluation of patient's condition.  Peter Belling, MD 11/14/14 [phone]

## 2014-11-14 NOTE — Progress Notes (Signed)
Patient is currently followed at home by [facility] for multiple myloma Dx with Dr. Delight Hoh as attending.  Wife called EMS after finding patient unresponsive.  Hospice social worker arrived to the patient's home to deliver DNR form and found EMS at patient's resident.  Requested that the wife allow RN to visit and evaluate, but wife declined and wanted patient evaluated in the ED and patient was transported to [facility].    Will continue to follow through hospitalization.  Dimas Aguas, RN Clinical Nurse Liaison [facility] [phone]- cell [phone] office

## 2014-11-15 DIAGNOSIS — D630 Anemia in neoplastic disease: Secondary | ICD-10-CM

## 2014-11-15 DIAGNOSIS — R55 Syncope and collapse: Secondary | ICD-10-CM

## 2014-11-15 DIAGNOSIS — Z9289 Personal history of other medical treatment: Secondary | ICD-10-CM | POA: Insufficient documentation

## 2014-11-15 DIAGNOSIS — I4891 Unspecified atrial fibrillation: Secondary | ICD-10-CM

## 2014-11-15 DIAGNOSIS — C9002 Multiple myeloma in relapse: Secondary | ICD-10-CM

## 2014-11-15 DIAGNOSIS — N189 Chronic kidney disease, unspecified: Secondary | ICD-10-CM

## 2014-11-15 DIAGNOSIS — Z87898 Personal history of other specified conditions: Secondary | ICD-10-CM

## 2014-11-15 DIAGNOSIS — N179 Acute kidney failure, unspecified: Secondary | ICD-10-CM

## 2014-11-15 DIAGNOSIS — E860 Dehydration: Secondary | ICD-10-CM

## 2014-11-15 LAB — COMPREHENSIVE METABOLIC PANEL
ALBUMIN: 2.7 g/dL — AB (ref 3.5–5.0)
ALT: 29 U/L (ref 17–63)
AST: 19 U/L (ref 15–41)
Alkaline Phosphatase: 39 U/L (ref 38–126)
BILIRUBIN TOTAL: 1 mg/dL (ref 0.3–1.2)
BUN: 47 mg/dL — ABNORMAL HIGH (ref 6–20)
CALCIUM: 8 mg/dL — AB (ref 8.9–10.3)
CHLORIDE: 111 mmol/L (ref 101–111)
CO2: 16 mmol/L — ABNORMAL LOW (ref 22–32)
Creatinine, Ser: 1.54 mg/dL — ABNORMAL HIGH (ref 0.61–1.24)
GFR calc non Af Amer: 42 mL/min — ABNORMAL LOW (ref >60)
GFR, EST AFRICAN AMERICAN: 49 mL/min — AB (ref >60)
Glucose, Bld: 245 mg/dL — ABNORMAL HIGH (ref 65–99)
POTASSIUM: 4.2 mmol/L (ref 3.5–5.1)
Sodium: 129 mmol/L — ABNORMAL LOW (ref 135–145)
Total Protein: 9.7 g/dL — ABNORMAL HIGH (ref 6.5–8.1)

## 2014-11-15 LAB — CBC
HCT: 20.8 % — ABNORMAL LOW (ref 39.0–52.0)
Hemoglobin: 7 g/dL — ABNORMAL LOW (ref 13.0–17.0)
MCH: 31.1 pg (ref 26.0–34.0)
MCHC: 33.7 g/dL (ref 30.0–36.0)
MCV: 92.4 fL (ref 78.0–100.0)
Platelets: 37 10*3/uL — ABNORMAL LOW (ref 150–400)
RBC: 2.25 MIL/uL — AB (ref 4.22–5.81)
RDW: 16.7 % — AB (ref 11.5–15.5)
WBC: 4.9 10*3/uL (ref 4.0–10.5)

## 2014-11-15 LAB — URINALYSIS, ROUTINE W REFLEX MICROSCOPIC
BILIRUBIN URINE: NEGATIVE
GLUCOSE, UA: 500 mg/dL — AB
KETONES UR: NEGATIVE mg/dL
LEUKOCYTES UA: NEGATIVE
NITRITE: NEGATIVE
SPECIFIC GRAVITY, URINE: 1.02 (ref 1.005–1.030)
Urobilinogen, UA: 0.2 mg/dL (ref 0.0–1.0)
pH: 5.5 (ref 5.0–8.0)

## 2014-11-15 LAB — URINE MICROSCOPIC-ADD ON

## 2014-11-15 LAB — PREPARE RBC (CROSSMATCH)

## 2014-11-15 LAB — GLUCOSE, CAPILLARY
Glucose-Capillary: 206 mg/dL — ABNORMAL HIGH (ref 65–99)
Glucose-Capillary: 220 mg/dL — ABNORMAL HIGH (ref 65–99)
Glucose-Capillary: 203 mg/dL — ABNORMAL HIGH (ref 65–99)

## 2014-11-15 LAB — ABO/RH: ABO/RH(D): A POS

## 2014-11-15 LAB — TROPONIN I: Troponin I: 0.04 ng/mL — ABNORMAL HIGH (ref <0.031)

## 2014-11-15 MED ORDER — INSULIN DETEMIR 100 UNIT/ML ~~LOC~~ SOLN
18.0000 [IU] | Freq: Every day | SUBCUTANEOUS | Status: DC
  Administered 2014-11-15 – 2014-11-16 (×2): 18 [IU] via SUBCUTANEOUS
  Filled 2014-11-15 (×3): qty 0.18

## 2014-11-15 MED ORDER — DILTIAZEM HCL 100 MG IV SOLR
5.0000 mg/h | INTRAVENOUS | Status: DC
  Administered 2014-11-15 (×2): 15 mg/h via INTRAVENOUS
  Administered 2014-11-15: 10 mg/h via INTRAVENOUS
  Administered 2014-11-15: 15 mg/h via INTRAVENOUS
  Filled 2014-11-15: qty 100

## 2014-11-15 MED ORDER — ATROPINE SULFATE 1 % OP SOLN
1.0000 [drp] | Freq: Three times a day (TID) | OPHTHALMIC | Status: DC
  Administered 2014-11-15 – 2014-11-19 (×13): 1 [drp] via OPHTHALMIC
  Filled 2014-11-15: qty 2

## 2014-11-15 MED ORDER — ACETAMINOPHEN 325 MG PO TABS
650.0000 mg | ORAL_TABLET | Freq: Four times a day (QID) | ORAL | Status: DC | PRN
  Administered 2014-11-15 – 2014-11-19 (×4): 650 mg via ORAL
  Filled 2014-11-15 (×4): qty 2

## 2014-11-15 MED ORDER — SODIUM CHLORIDE 0.9 % IV BOLUS (SEPSIS)
500.0000 mL | Freq: Once | INTRAVENOUS | Status: AC
  Administered 2014-11-15: 500 mL via INTRAVENOUS

## 2014-11-15 MED ORDER — SODIUM CHLORIDE 0.9 % IV SOLN
Freq: Once | INTRAVENOUS | Status: AC
  Administered 2014-11-15: 14:00:00 via INTRAVENOUS

## 2014-11-15 NOTE — Progress Notes (Signed)
[facility] PROGRESS NOTE  Peter Walters ZSW:[id number] DOB: 04/10/1936 DOA: 11/14/2014 PCP: Petra Kuba, MD  Assessment/Plan: A. fib with rapid ventricular response -We'll transfuse 2 units of PRBCs as anemia may be playing a role in his rapid rate. -He has been unable to take his long-acting diltiazem for the past 2 days as the wife relays difficulty swallowing. -He is currently on an amiodarone and on diltiazem drip. -Plan will be to transfuse 2 units, then start by mouth diltiazem in hopes of weaning drips. -No anticoagulation at this time given anemia, thrombocytopenia and DO NOT RESUSCITATE/hospice state.  Normocytic anemia -Likely anemia of chronic disease given his multiple myeloma. -We'll transfuse 2 units of PRBCs for hemoglobin of 7.  Acute on chronic kidney disease stage III -Appears baseline creatinine is around 1.3-1.4. -Mainly related to multiple myeloma. -Continue IV fluids and recheck renal function in the morning.  Multiple myeloma -Patient's oncologist is Dr. Grayland Ormond in [place]. -Per patient's wife they have stopped chemotherapy as of March. -Follow-up with oncology as an outpatient as scheduled.  Diabetes -Poor control. -Increase Levemir from 15-18 units.  Code Status: DO NOT RESUSCITATE Family Communication: Wife Manuela Schwartz at bedside updated on plan of care  Disposition Plan: Keep in ICU   Consultants:  Cardiology   Antibiotics:  None   Subjective: Remains lethargic, per wife is hard of hearing.  Objective: Filed Vitals:   11/15/14 0630 11/15/14 0700 11/15/14 0747 11/15/14 0800  BP: 109/61 99/64    Pulse: 60 54    Temp:   97.9 F (36.6 C)   TempSrc:   Oral   Resp: 18 17  14   Weight:      SpO2: 100% 100%      Intake/Output Summary (Last 24 hours) at 11/15/14 0934 Last data filed at 11/15/14 0755  Gross per 24 hour  Intake 991.25 ml  Output    750 ml  Net 241.25 ml   Filed Weights   11/14/14 1244 11/15/14 0500    Weight: 75.297 kg (166 lb) 65.4 kg (144 lb 2.9 oz)    Exam:   General:  Drowsy, mumbles to sternal rub  Cardiovascular: Tachycardic, irregular, no murmurs auscultated  Respiratory: Clear to auscultation bilaterally  Abdomen: Soft, nontender, nondistended, positive bowel sounds  Extremities: No clubbing, cyanosis or edema, positive pulses   Neurologic:  Unable to fully assess given current mental state  Data Reviewed: Basic Metabolic Panel:  Recent Labs Lab 11/12/14 0956 11/14/14 1318 11/14/14 1324 11/14/14 1841 11/15/14 0425  NA 119* 126* 134*  --  129*  K 5.6* 4.4 4.7  --  4.2  CL 98* 103 108  --  111  CO2 17* 15*  --   --  16*  GLUCOSE 425* 376* 393*  --  245*  BUN 60* 68* 72*  --  47*  CREATININE 1.62* 1.94* 1.90* 1.85* 1.54*  CALCIUM 8.7* 8.6*  --  8.5* 8.0*  MG  --  1.9  --  1.9  --    Liver Function Tests:  Recent Labs Lab 11/14/14 1318 11/15/14 0425  AST 19 19  ALT 34 29  ALKPHOS 42 39  BILITOT 1.3* 1.0  PROT 10.7* 9.7*  ALBUMIN 3.0* 2.7*   No results for input(s): LIPASE, AMYLASE in the last 168 hours. No results for input(s): AMMONIA in the last 168 hours. CBC:  Recent Labs Lab 11/12/14 0956 11/14/14 1318 11/14/14 1324 11/14/14 1841 11/15/14 0425  WBC 5.8 6.8  --  7.3 4.9  NEUTROABS 3.7 4.9  --   --   --   HGB 7.3* 7.4* 8.2* 7.4* 7.0*  HCT 21.5* 21.9* 24.0* 21.6* 20.8*  MCV 90.1 89.4  --  89.6 92.4  PLT 38* 38*  --  42* 37*   Cardiac Enzymes:  Recent Labs Lab 11/14/14 1318 11/14/14 1841 11/14/14 2307 11/15/14 0425  TROPONINI 0.05* 0.04* 0.04* 0.04*   BNP (last 3 results) No results for input(s): BNP in the last 8760 hours.  ProBNP (last 3 results) No results for input(s): PROBNP in the last 8760 hours.  CBG:  Recent Labs Lab 11/14/14 1826 11/14/14 2021 11/15/14 0730  GLUCAP 363* 347* 206*    Recent Results (from the past 240 hour(s))  MRSA PCR Screening     Status: None   Collection Time: 11/14/14  6:17 PM   Result Value Ref Range Status   MRSA by PCR NEGATIVE NEGATIVE Final    Comment:        The GeneXpert MRSA Assay (FDA approved for NASAL specimens only), is one component of a comprehensive MRSA colonization surveillance program. It is not intended to diagnose MRSA infection nor to guide or monitor treatment for MRSA infections.   Culture, blood (routine x 2)     Status: None (Preliminary result)   Collection Time: 11/15/14 12:40 AM  Result Value Ref Range Status   Specimen Description BLOOD LEFT HAND  Final   Special Requests   Final    BOTTLES DRAWN AEROBIC AND ANAEROBIC AEB 6CC ANA [place]   Culture PENDING  Incomplete   Report Status PENDING  Incomplete  Culture, blood (routine x 2)     Status: None (Preliminary result)   Collection Time: 11/15/14 12:55 AM  Result Value Ref Range Status   Specimen Description BLOOD RIGHT HAND  Final   Special Requests BOTTLES DRAWN AEROBIC AND ANAEROBIC [facility] EACH  Final   Culture PENDING  Incomplete   Report Status PENDING  Incomplete     Studies: Ct Head Wo Contrast  11/14/2014   CLINICAL DATA:  Unresponsive.  Undergoing chemotherapy for myeloma.  EXAM: CT HEAD WITHOUT CONTRAST  TECHNIQUE: Contiguous axial images were obtained from the base of the skull through the vertex without intravenous contrast.  COMPARISON:  None.  FINDINGS: Mild atrophy with diffuse sulcal prominence and prominence of the bifrontal extra-axial spaces. Scattered minimal periventricular hypodensities suggestive of microvascular ischemic disease. Bilateral basal ganglial calcifications. No CT evidence of acute large territory infarct. No intraparenchymal or extra-axial mass or hemorrhage. Normal size and configuration of the ventricles and basilar cisterns. No midline shift. Intracranial atherosclerosis. Limited visualization of the paranasal sinuses and mastoid air cells is normal. No air-fluid levels. Regional soft tissues appear normal. Post bilateral cataract surgery. No  displaced calvarial fracture.  IMPRESSION: Atrophy and microvascular ischemic disease without acute intracranial process.   Electronically Signed   By: Sandi Mariscal M.D.   On: 11/14/2014 16:41   Dg Chest Portable 1 View  11/14/2014   CLINICAL DATA:  Multiple myeloma.  Atrial fibrillation  EXAM: PORTABLE CHEST - 1 VIEW  COMPARISON:  December 17, 2013  FINDINGS: There is no edema or consolidation. No mass or adenopathy. Heart size and pulmonary vascularity are normal. Port-A-Cath tip is in the superior vena cava. No pneumothorax. No blastic or lytic bone lesions are appreciable.  IMPRESSION: No edema or consolidation.   Electronically Signed   By: Lowella Grip III M.D.   On: 11/14/2014 13:37    Scheduled Meds: . sodium  chloride   Intravenous Once  . atorvastatin  40 mg Oral QHS  . atropine   Left Eye TID  . brimonidine  1 drop Left Eye TID  . calcium-vitamin D  1 tablet Oral Q breakfast  . dorzolamide-timolol  1 drop Left Eye BID  . feeding supplement (GLUCERNA SHAKE)  237 mL Oral BID WC  . glipiZIDE  10 mg Oral BID WC  . heparin  5,000 Units Subcutaneous 3 times per day  . insulin aspart  0-5 Units Subcutaneous QHS  . insulin aspart  0-9 Units Subcutaneous TID WC  . insulin detemir  15 Units Subcutaneous QHS  . latanoprost  1 drop Left Eye QHS  . magnesium oxide  400 mg Oral BID  . megestrol  40 mg Oral Daily  . metoprolol  5 mg Intravenous 4 times per day  . pantoprazole  40 mg Oral Daily  . prednisoLONE acetate  1 drop Left Eye QID  . sodium chloride  3 mL Intravenous Q12H   Continuous Infusions: . sodium chloride 75 mL/hr at 11/15/14 0800  . amiodarone 30 mg/hr (11/15/14 0842)  . diltiazem (CARDIZEM) infusion 15 mg/hr (11/15/14 0600)    Active Problems:   Multiple myeloma in relapse   Anemia in neoplastic disease   Atrial fibrillation with RVR   Diabetes   Atrial fibrillation with rapid ventricular response    Time spent: 35 minutes. Greater than 50% of this time was spent  in direct contact with the patient coordinating care.    Lelon Frohlich  [facility] Pager [phone]  If 7PM-7AM, please contact night-coverage at [email or web address], password [facility] 11/15/2014, 9:34 AM  LOS: 1 day

## 2014-11-15 NOTE — Consult Note (Addendum)
CARDIOLOGY CONSULT NOTE   Patient ID: Peter Walters MRN: [id number] DOB/AGE: 09-15-1935 79 y.o.  Admit Date: 11/14/2014 Referring Physician: PTH Primary Physician: Petra Kuba, MD Consulting Cardiologist: Kate Sable MD Primary Cardiologist: Serafina Royals MD-[facility], [facility] Reason for Consultation: Atrial fib with RVR  Clinical Summary Mr. Pettie is a 79 y.o.male with known history of PAF, hypertension, CAD s/p PCI at [facility], with multiple myeloma undergoing chemo with Revlimid and Velcade per Dr. Grayland Ormond, oncology.  He was recently discharged from [facility] on 10/24/2014 after admission for atrial fib with RVR, dehydration, and pancytopenia. He was seen by Dr. Nehemiah Massed, cardiologist, who treated him with IV diltiazem, with transition to po metoprolol 25 mg  and long acting diltiazem 240 mg.  He was also noted to be in renal failure during that hospitalization and was seen by nephrology. ACE inhibitor was held . (Verification of medications with his wife at bedside is different from discharge medication list per oncology). He is under Hospice Care.      He was feeling well until yesterday, when he got up to go to the bathroom. He felt weak and passed out. He felt his HR racing before passing out. His wife at bedside states that he has been unable to swallow diltiazem capsule for two days as it choked him. She called EMS and Hospice when her husband became unresponsive and she elected to have him taken to ER in [place], as this was closest to their home.   In ER, BP 120/65, HR 175, O2 sat 100%. He was found to be anemic with Hgb of 7.4/ Hct of 21.6, Plts 42  Creatinine 1.85, Na 134, Glucose 393. Mg 1.9. EKG demonstrated atrial fib with rate of 183, LVH, with T-wave inversion. CT of the head did not show acute bleed or intracranial abnormality. CXR was negative for CHF or pneumonia.  He was treated with diltiazem IV bolus and began on gtt,  amiodarone was also instituted via bolus and gtt. He became responsive in ER after IV fluids.   He remains on diltiazem and amiodarone gtt's currently. He remains weak.      Allergies  Allergen Reactions  . Aspirin Hives and Other (See Comments)    Reaction:  Causes pt to bleed.   . Celecoxib Other (See Comments)    Reaction:  Causes pt to bleed.     Medications Scheduled Medications: . atorvastatin  40 mg Oral QHS  . atropine   Left Eye TID  . brimonidine  1 drop Left Eye TID  . calcium-vitamin D  1 tablet Oral Q breakfast  . dorzolamide-timolol  1 drop Left Eye BID  . feeding supplement (GLUCERNA SHAKE)  237 mL Oral BID WC  . glipiZIDE  10 mg Oral BID WC  . heparin  5,000 Units Subcutaneous 3 times per day  . insulin aspart  0-5 Units Subcutaneous QHS  . insulin aspart  0-9 Units Subcutaneous TID WC  . insulin detemir  15 Units Subcutaneous QHS  . latanoprost  1 drop Left Eye QHS  . magnesium oxide  400 mg Oral BID  . megestrol  40 mg Oral Daily  . metoprolol  5 mg Intravenous 4 times per day  . pantoprazole  40 mg Oral Daily  . prednisoLONE acetate  1 drop Left Eye QID  . sodium chloride  3 mL Intravenous Q12H    Infusions: . sodium chloride 75 mL/hr at 11/14/14 2123  . amiodarone 30 mg/hr (11/15/14 0600)  .  diltiazem (CARDIZEM) infusion 15 mg/hr (11/15/14 0600)    PRN Medications: acetaminophen, ondansetron **OR** ondansetron (ZOFRAN) IV, oxyCODONE-acetaminophen   Past Medical History  Diagnosis Date  . A-fib   . Hypertension   . Benign prostatic hypertrophy   . History of gastrointestinal bleeding   . Hyperlipidemia   . History of multiple myeloma   . CAD (coronary artery disease)   . GERD (gastroesophageal reflux disease)   . CHF (congestive heart failure)   . Peptic ulcer disease   . Hyponatremia   . Multiple myeloma in relapse 10/17/2014  . Cancer     multiple myeloma  . Diabetes mellitus without complication   . 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 Cardiac catheterization    . Coronary angioplasty      [facility]  . Eye surgery      Family History  Problem Relation Age of Onset  . Heart disease Father   . Kidney failure Brother     Social History Mr. Lanzer reports that he has never smoked. He does not have any smokeless tobacco history on file. Mr. Kornegay reports that he does not drink alcohol.  Review of Systems Complete review of systems are found to be negative unless outlined in H&P above.  Physical Examination Blood pressure 109/61, pulse 60, temperature 97.9 F (36.6 C), temperature source Oral, resp. rate 18, weight 144 lb 2.9 oz (65.4 kg), SpO2 100 %.  Intake/Output Summary (Last 24 hours) at 11/15/14 0809 Last data filed at 11/15/14 0755  Gross per 24 hour  Intake 991.25 ml  Output    750 ml  Net 241.25 ml    Telemetry: Atrial fib rate 110-115 bpm.   GEN: No acute distress, ill appearing. Thin HEENT: Left eye lid is drooped and unable to open, right eye normal, oropharynx clear with moist mucosa. Neck: Supple, no elevated JVP or carotid bruits, no thyromegaly. Lungs: Clear to auscultation, nonlabored breathing at rest. Cardiac: Irregular rate and rhythm,tachycardic no S3 or significant systolic murmur, no pericardial rub. Abdomen: Soft, nontender, no hepatomegaly, bowel sounds present, no guarding or rebound. Extremities: No pitting edema, distal pulses 2+. Skin: Warm and dry. Musculoskeletal: No kyphosis. Port-a-cath right subclavian. Neuropsychiatric: Alert and oriented x3, affect grossly appropriate.  Prior Cardiac Testing/Procedures 1. NM Study (transcribed from scanned document) Left ventricular global function was normal The myocardial perfusion scan showed no evidence of pathology  The stress to rest volume ratio is 1.00 for the left ventricle.  Pharmacological myocardial perfusion study with no significant  ischemia.   Echo was completed in June per notes, normal EF. Unable to verify in [facility]  Lab Results  Basic Metabolic Panel:  Recent Labs Lab 11/12/14 0956 11/14/14 1318 11/14/14 1324 11/14/14 1841 11/15/14 0425  NA 119* 126* 134*  --  129*  K 5.6* 4.4 4.7  --  4.2  CL 98* 103 108  --  111  CO2 17* 15*  --   --  16*  GLUCOSE 425* 376* 393*  --  245*  BUN 60* 68* 72*  --  47*  CREATININE 1.62* 1.94* 1.90* 1.85* 1.54*  CALCIUM 8.7* 8.6*  --  8.5* 8.0*  MG  --  1.9  --  1.9  --     Liver Function Tests:  Recent Labs Lab 11/14/14 1318 11/15/14 0425  AST 19 19  ALT 34 29  ALKPHOS 42  20  BILITOT 1.3* 1.0  PROT 10.7* 9.7*  ALBUMIN 3.0* 2.7*    CBC:  Recent Labs Lab 11/12/14 0956 11/14/14 1318 11/14/14 1324 11/14/14 1841 11/15/14 0425  WBC 5.8 6.8  --  7.3 4.9  NEUTROABS 3.7 4.9  --   --   --   HGB 7.3* 7.4* 8.2* 7.4* 7.0*  HCT 21.5* 21.9* 24.0* 21.6* 20.8*  MCV 90.1 89.4  --  89.6 92.4  PLT 38* 38*  --  42* 37*    Cardiac Enzymes:  Recent Labs Lab 11/14/14 1318 11/14/14 1841 11/14/14 2307 11/15/14 0425  TROPONINI 0.05* 0.04* 0.04* 0.04*    Radiology: Ct Head Wo Contrast  11/14/2014   CLINICAL DATA:  Unresponsive.  Undergoing chemotherapy for myeloma.  EXAM: CT HEAD WITHOUT CONTRAST  TECHNIQUE: Contiguous axial images were obtained from the base of the skull through the vertex without intravenous contrast.  COMPARISON:  None.  FINDINGS: Mild atrophy with diffuse sulcal prominence and prominence of the bifrontal extra-axial spaces. Scattered minimal periventricular hypodensities suggestive of microvascular ischemic disease. Bilateral basal ganglial calcifications. No CT evidence of acute large territory infarct. No intraparenchymal or extra-axial mass or hemorrhage. Normal size and configuration of the ventricles and basilar cisterns. No midline shift. Intracranial atherosclerosis. Limited visualization of the paranasal sinuses and mastoid air cells is  normal. No air-fluid levels. Regional soft tissues appear normal. Post bilateral cataract surgery. No displaced calvarial fracture.  IMPRESSION: Atrophy and microvascular ischemic disease without acute intracranial process.   Electronically Signed   By: Sandi Mariscal M.D.   On: 11/14/2014 16:41   Dg Chest Portable 1 View  11/14/2014   CLINICAL DATA:  Multiple myeloma.  Atrial fibrillation  EXAM: PORTABLE CHEST - 1 VIEW  COMPARISON:  December 17, 2013  FINDINGS: There is no edema or consolidation. No mass or adenopathy. Heart size and pulmonary vascularity are normal. Port-A-Cath tip is in the superior vena cava. No pneumothorax. No blastic or lytic bone lesions are appreciable.  IMPRESSION: No edema or consolidation.   Electronically Signed   By: Lowella Grip III M.D.   On: 11/14/2014 13:37     ECG: Atrial fib with RVR, LVH.   Impression and Recommendations  1.Atrial fib with RVR: Multifactorial. He has severe anemia, multiple myeloma stage III, and pancytopenia. He has not been able to take po long acting diltiazem due to difficulty swallowing, for two days prior to syncope. He remains on amiodarone and diltiazem gtt. Doubt he will have conversion to NSR with Hgb of 7.0. Recommend transfusion before weaning the gtt's. Would probably need to change long acting diltiazem to divided doses to make it easier for him to swallow. Would not recommend anticoagulation at this time with thrombocytopenia and anemia. Will consider adding back metoprolol once he is hemodynamically stable verses continuing po amiodarone. Will begin weaning once blood transfusions are complete.  2. Anemia: Recommend blood transfusion  3. Hypertension: Hypotensive at this time. Will try and wean diltiazem once he receives transfusion if BP is stable.  4. Multiple Myeloma: Port-a-cath in place and is followed by Dr. Grayland Ormond, oncology in [place]. Patient's wife states that they have not yet seen him on follow up as the patient had  to return to the hospital.   5. DNR: Under Hospice Care. Wife would like as much done as possible but no heroic measures.   Signed: Phill Myron. [place] NP [place]  11/15/2014, 8:09 AM Co-Sign MD  The patient was seen and examined, and I agree  with the assessment and plan as documented above. Pt with aforementioned history admitted with rapid atrial fibrillation and unresponsiveness in context of end-stage multiple myeloma and dehydration. Currently on IV diltiazem and amiodarone with elevated HR. Agree with transfusing PRBC's and then transitioning to short-acting diltiazem with more frequent dosing along with metoprolol if BP tolerates. Needs aggressive IV fluid repletement as well. No anticoagulation due to anemia and thrombocytopenia.

## 2014-11-15 NOTE — Care Management Note (Signed)
Case Management Note  Patient Details  Name: Peter Walters MRN: [id number] Date of Birth: 04/10/1936   Expected Discharge Date:                  Expected Discharge Plan:  Home w Hospice Care  In-House Referral:  NA  Discharge planning Services  CM Consult  Post Acute Care Choice:  NA Choice offered to:  NA  DME Arranged:    DME Agency:     HH Arranged:    HH Agency:     Status of Service:  In process, will continue to follow  Medicare Important Message Given:    Date Medicare IM Given:    Medicare IM give by:    Date Additional Medicare IM Given:    Additional Medicare Important Message give by:     If discussed at [place] of Stay Meetings, dates discussed:    Additional Comments: Pt is from home, lives with wife and has strong family support. Pt is active with [facility] in the home. Pt plans to return home with resumption of Hospice services. Hospice is aware of admission. Will cont to follow.   Sherald Barge, RN 11/15/2014, 4:32 PM

## 2014-11-16 LAB — TYPE AND SCREEN
ABO/RH(D): A POS
Antibody Screen: NEGATIVE
Unit division: 0
Unit division: 0

## 2014-11-16 LAB — BASIC METABOLIC PANEL
Anion gap: 3 — ABNORMAL LOW (ref 5–15)
BUN: 28 mg/dL — ABNORMAL HIGH (ref 6–20)
CO2: 19 mmol/L — AB (ref 22–32)
Calcium: 8.4 mg/dL — ABNORMAL LOW (ref 8.9–10.3)
Chloride: 110 mmol/L (ref 101–111)
Creatinine, Ser: 1.23 mg/dL (ref 0.61–1.24)
GFR calc Af Amer: >60 mL/min (ref >60)
GFR calc non Af Amer: 55 mL/min — ABNORMAL LOW (ref >60)
Glucose, Bld: 182 mg/dL — ABNORMAL HIGH (ref 65–99)
Potassium: 4.3 mmol/L (ref 3.5–5.1)
SODIUM: 132 mmol/L — AB (ref 135–145)

## 2014-11-16 LAB — CBC
HCT: 29.4 % — ABNORMAL LOW (ref 39.0–52.0)
HCT: 30.6 % — ABNORMAL LOW (ref 39.0–52.0)
Hemoglobin: 10.2 g/dL — ABNORMAL LOW (ref 13.0–17.0)
Hemoglobin: 10.8 g/dL — ABNORMAL LOW (ref 13.0–17.0)
MCH: 31.1 pg (ref 26.0–34.0)
MCH: 31.4 pg (ref 26.0–34.0)
MCHC: 34.7 g/dL (ref 30.0–36.0)
MCHC: 35.3 g/dL (ref 30.0–36.0)
MCV: 89 fL (ref 78.0–100.0)
MCV: 89.6 fL (ref 78.0–100.0)
Platelets: 38 10*3/uL — ABNORMAL LOW (ref 150–400)
Platelets: 39 10*3/uL — ABNORMAL LOW (ref 150–400)
RBC: 3.28 MIL/uL — ABNORMAL LOW (ref 4.22–5.81)
RBC: 3.44 MIL/uL — AB (ref 4.22–5.81)
RDW: 16.7 % — AB (ref 11.5–15.5)
RDW: 16.9 % — AB (ref 11.5–15.5)
WBC: 5.8 10*3/uL (ref 4.0–10.5)
WBC: 6.4 10*3/uL (ref 4.0–10.5)

## 2014-11-16 LAB — GLUCOSE, CAPILLARY
GLUCOSE-CAPILLARY: 150 mg/dL — AB (ref 65–99)
GLUCOSE-CAPILLARY: 143 mg/dL — AB (ref 65–99)
Glucose-Capillary: 154 mg/dL — ABNORMAL HIGH (ref 65–99)
Glucose-Capillary: 134 mg/dL — ABNORMAL HIGH (ref 65–99)

## 2014-11-16 MED ORDER — METOPROLOL TARTRATE 25 MG PO TABS
25.0000 mg | ORAL_TABLET | Freq: Two times a day (BID) | ORAL | Status: DC
  Administered 2014-11-16 (×2): 25 mg via ORAL
  Filled 2014-11-16 (×2): qty 1

## 2014-11-16 MED ORDER — DOCUSATE SODIUM 100 MG PO CAPS
100.0000 mg | ORAL_CAPSULE | Freq: Two times a day (BID) | ORAL | Status: DC
  Administered 2014-11-16 – 2014-11-19 (×7): 100 mg via ORAL
  Filled 2014-11-16 (×7): qty 1

## 2014-11-16 MED ORDER — DILTIAZEM HCL 60 MG PO TABS
60.0000 mg | ORAL_TABLET | Freq: Three times a day (TID) | ORAL | Status: DC
  Administered 2014-11-16 – 2014-11-17 (×3): 60 mg via ORAL
  Filled 2014-11-16 (×3): qty 1

## 2014-11-16 MED ORDER — ZOLPIDEM TARTRATE 5 MG PO TABS
5.0000 mg | ORAL_TABLET | Freq: Every evening | ORAL | Status: DC | PRN
  Administered 2014-11-16: 5 mg via ORAL
  Filled 2014-11-16: qty 1

## 2014-11-16 NOTE — Progress Notes (Signed)
[facility] PROGRESS NOTE  Peter Walters TMH:[id number] DOB: [birth year] DOA: 11/14/2014 PCP: Petra Kuba, MD  Assessment/Plan: Atrial fibrillation with rapid ventricular response -Rates are significantly improve and remained in the 70s to 80s this a.m. -We'll start by mouth Lopressor and Cardizem and wean first amiodarone drip and then the Cardizem drip. Plan has been discussed with RN.  Normocytic anemia -Anemia of chronic disease given his multiple myeloma. -Was transfused 2 units of PRBCs on 6/3 for hemoglobin of 7.0. -Hemoglobin today is 10.8.  Acute on chronic kidney disease stage III -Baseline creatinine appears to be around 1.3-1.4. -Mainly secondary to multiple myeloma. -Creatinine today is back to baseline of 1.23.  Multiple myeloma -Patient's oncologist is Dr. Grayland Ormond in [place]. -Per patient's wife, they have stopped chemotherapy. -Continue outpatient follow-up with oncology and hospice care.  Diabetes mellitus -Somewhat improved control on increased dose of Levemir, keep her current dose and follow throughout the next 24 hours.   Code Status: DNR Family Communication: Wife Manuela Schwartz at bedside updated on plan of care  Disposition Plan: Keep in ICU today, while weaning drips. Hopeful for DC back home with hospice next 24-48 hours.   Consultants:  Cardiology   Antibiotics:  None   Subjective: More alert today. C/o staining when having a BM.  Objective: Filed Vitals:   11/16/14 0530 11/16/14 0545 11/16/14 0600 11/16/14 0819  BP: 139/73 146/97 143/77   Pulse: 64 75 60   Temp:    97.7 F (36.5 C)  TempSrc:    Oral  Resp: 16 26 15    Height:      Weight:      SpO2: 100% 100% 100%     Intake/Output Summary (Last 24 hours) at 11/16/14 0927 Last data filed at 11/16/14 0600  Gross per 24 hour  Intake 4714.77 ml  Output    975 ml  Net 3739.77 ml   Filed Weights   11/14/14 1244 11/15/14 0500 11/16/14 0500  Weight: 75.297 kg (166  lb) 65.4 kg (144 lb 2.9 oz) 65 kg (143 lb 4.8 oz)    Exam:   General:  AA Ox3  Cardiovascular: RRR  Respiratory: CTA B  Abdomen: S/NT/ND/+BS  Extremities: no C/C/E   Neurologic:  Non-focal  Data Reviewed: Basic Metabolic Panel:  Recent Labs Lab 11/12/14 0956 11/14/14 1318 11/14/14 1324 11/14/14 1841 11/15/14 0425 11/16/14 0508  NA 119* 126* 134*  --  129* 132*  K 5.6* 4.4 4.7  --  4.2 4.3  CL 98* 103 108  --  111 110  CO2 17* 15*  --   --  16* 19*  GLUCOSE 425* 376* 393*  --  245* 182*  BUN 60* 68* 72*  --  47* 28*  CREATININE 1.62* 1.94* 1.90* 1.85* 1.54* 1.23  CALCIUM 8.7* 8.6*  --  8.5* 8.0* 8.4*  MG  --  1.9  --  1.9  --   --    Liver Function Tests:  Recent Labs Lab 11/14/14 1318 11/15/14 0425  AST 19 19  ALT 34 29  ALKPHOS 42 39  BILITOT 1.3* 1.0  PROT 10.7* 9.7*  ALBUMIN 3.0* 2.7*   No results for input(s): LIPASE, AMYLASE in the last 168 hours. No results for input(s): AMMONIA in the last 168 hours. CBC:  Recent Labs Lab 11/12/14 0956 11/14/14 1318 11/14/14 1324 11/14/14 1841 11/15/14 0425 11/16/14 0021 11/16/14 0508  WBC 5.8 6.8  --  7.3 4.9 5.8 6.4  NEUTROABS 3.7 4.9  --   --   --   --   --  HGB 7.3* 7.4* 8.2* 7.4* 7.0* 10.2* 10.8*  HCT 21.5* 21.9* 24.0* 21.6* 20.8* 29.4* 30.6*  MCV 90.1 89.4  --  89.6 92.4 89.6 89.0  PLT 38* 38*  --  42* 37* 38* 39*   Cardiac Enzymes:  Recent Labs Lab 11/14/14 1318 11/14/14 1841 11/14/14 2307 11/15/14 0425  TROPONINI 0.05* 0.04* 0.04* 0.04*   BNP (last 3 results) No results for input(s): BNP in the last 8760 hours.  ProBNP (last 3 results) No results for input(s): PROBNP in the last 8760 hours.  CBG:  Recent Labs Lab 11/14/14 2021 11/15/14 0730 11/15/14 1117 11/15/14 1632 11/16/14 0736  GLUCAP 347* 206* 220* 203* 154*    Recent Results (from the past 240 hour(s))  MRSA PCR Screening     Status: None   Collection Time: 11/14/14  6:17 PM  Result Value Ref Range Status    MRSA by PCR NEGATIVE NEGATIVE Final    Comment:        The GeneXpert MRSA Assay (FDA approved for NASAL specimens only), is one component of a comprehensive MRSA colonization surveillance program. It is not intended to diagnose MRSA infection nor to guide or monitor treatment for MRSA infections.   Culture, blood (routine x 2)     Status: None (Preliminary result)   Collection Time: 11/15/14 12:40 AM  Result Value Ref Range Status   Specimen Description BLOOD LEFT HAND  Final   Special Requests   Final    BOTTLES DRAWN AEROBIC AND ANAEROBIC AEB 6CC ANA [place]   Culture NO GROWTH 1 DAY  Final   Report Status PENDING  Incomplete  Culture, blood (routine x 2)     Status: None (Preliminary result)   Collection Time: 11/15/14 12:55 AM  Result Value Ref Range Status   Specimen Description BLOOD RIGHT HAND  Final   Special Requests BOTTLES DRAWN AEROBIC AND ANAEROBIC [place]  Final   Culture NO GROWTH 1 DAY  Final   Report Status PENDING  Incomplete     Studies: Ct Head Wo Contrast  11/14/2014   CLINICAL DATA:  Unresponsive.  Undergoing chemotherapy for myeloma.  EXAM: CT HEAD WITHOUT CONTRAST  TECHNIQUE: Contiguous axial images were obtained from the base of the skull through the vertex without intravenous contrast.  COMPARISON:  None.  FINDINGS: Mild atrophy with diffuse sulcal prominence and prominence of the bifrontal extra-axial spaces. Scattered minimal periventricular hypodensities suggestive of microvascular ischemic disease. Bilateral basal ganglial calcifications. No CT evidence of acute large territory infarct. No intraparenchymal or extra-axial mass or hemorrhage. Normal size and configuration of the ventricles and basilar cisterns. No midline shift. Intracranial atherosclerosis. Limited visualization of the paranasal sinuses and mastoid air cells is normal. No air-fluid levels. Regional soft tissues appear normal. Post bilateral cataract surgery. No displaced calvarial fracture.   IMPRESSION: Atrophy and microvascular ischemic disease without acute intracranial process.   Electronically Signed   By: Sandi Mariscal M.D.   On: 11/14/2014 16:41   Dg Chest Portable 1 View  11/14/2014   CLINICAL DATA:  Multiple myeloma.  Atrial fibrillation  EXAM: PORTABLE CHEST - 1 VIEW  COMPARISON:  December 17, 2013  FINDINGS: There is no edema or consolidation. No mass or adenopathy. Heart size and pulmonary vascularity are normal. Port-A-Cath tip is in the superior vena cava. No pneumothorax. No blastic or lytic bone lesions are appreciable.  IMPRESSION: No edema or consolidation.   Electronically Signed   By: Lowella Grip III M.D.   On: 11/14/2014 13:37  Scheduled Meds: . atorvastatin  40 mg Oral QHS  . atropine  1 drop Left Eye TID  . brimonidine  1 drop Left Eye TID  . calcium-vitamin D  1 tablet Oral Q breakfast  . diltiazem  60 mg Oral 3 times per day  . docusate sodium  100 mg Oral BID  . dorzolamide-timolol  1 drop Left Eye BID  . feeding supplement (GLUCERNA SHAKE)  237 mL Oral BID WC  . glipiZIDE  10 mg Oral BID WC  . heparin  5,000 Units Subcutaneous 3 times per day  . insulin aspart  0-5 Units Subcutaneous QHS  . insulin aspart  0-9 Units Subcutaneous TID WC  . insulin detemir  18 Units Subcutaneous QHS  . latanoprost  1 drop Left Eye QHS  . magnesium oxide  400 mg Oral BID  . megestrol  40 mg Oral Daily  . metoprolol  5 mg Intravenous 4 times per day  . metoprolol tartrate  25 mg Oral BID  . pantoprazole  40 mg Oral Daily  . prednisoLONE acetate  1 drop Left Eye QID  . sodium chloride  3 mL Intravenous Q12H   Continuous Infusions: . sodium chloride 75 mL/hr at 11/15/14 2335  . amiodarone 30 mg/hr (11/15/14 2258)  . diltiazem (CARDIZEM) infusion 5 mg/hr (11/16/14 0513)    Principal Problem:   Atrial fibrillation with rapid ventricular response Active Problems:   Anemia in neoplastic disease   Multiple myeloma in relapse   Atrial fibrillation with RVR    Diabetes   Dehydration   Acute on chronic renal failure   Faintness   History of recent hospitalization    Time spent: 35 minutes. Greater than 50% of this time was spent in direct contact with the patient coordinating care.    Lelon Frohlich  [facility] Pager [phone]  If 7PM-7AM, please contact night-coverage at [email or web address], password [facility] 11/16/2014, 9:27 AM  LOS: 2 days

## 2014-11-17 DIAGNOSIS — I4729 Other ventricular tachycardia: Secondary | ICD-10-CM

## 2014-11-17 DIAGNOSIS — I472 Ventricular tachycardia: Secondary | ICD-10-CM

## 2014-11-17 LAB — CBC
HCT: 28.2 % — ABNORMAL LOW (ref 39.0–52.0)
Hemoglobin: 9.6 g/dL — ABNORMAL LOW (ref 13.0–17.0)
MCH: 30.7 pg (ref 26.0–34.0)
MCHC: 34 g/dL (ref 30.0–36.0)
MCV: 90.1 fL (ref 78.0–100.0)
Platelets: 39 10*3/uL — ABNORMAL LOW (ref 150–400)
RBC: 3.13 MIL/uL — ABNORMAL LOW (ref 4.22–5.81)
RDW: 16.9 % — ABNORMAL HIGH (ref 11.5–15.5)
WBC: 5.5 10*3/uL (ref 4.0–10.5)

## 2014-11-17 LAB — GLUCOSE, CAPILLARY
GLUCOSE-CAPILLARY: 67 mg/dL (ref 65–99)
Glucose-Capillary: 67 mg/dL (ref 65–99)
Glucose-Capillary: 162 mg/dL — ABNORMAL HIGH (ref 65–99)
Glucose-Capillary: 58 mg/dL — ABNORMAL LOW (ref 65–99)
Glucose-Capillary: 142 mg/dL — ABNORMAL HIGH (ref 65–99)

## 2014-11-17 LAB — BASIC METABOLIC PANEL
ANION GAP: 6 (ref 5–15)
BUN: 17 mg/dL (ref 6–20)
CO2: 16 mmol/L — ABNORMAL LOW (ref 22–32)
Calcium: 8 mg/dL — ABNORMAL LOW (ref 8.9–10.3)
Chloride: 110 mmol/L (ref 101–111)
Creatinine, Ser: 1.21 mg/dL (ref 0.61–1.24)
GFR calc Af Amer: >60 mL/min (ref >60)
GFR calc non Af Amer: 56 mL/min — ABNORMAL LOW (ref >60)
GLUCOSE: 66 mg/dL (ref 65–99)
POTASSIUM: 3.4 mmol/L — AB (ref 3.5–5.1)
SODIUM: 132 mmol/L — AB (ref 135–145)

## 2014-11-17 MED ORDER — METOPROLOL TARTRATE 50 MG PO TABS
50.0000 mg | ORAL_TABLET | Freq: Two times a day (BID) | ORAL | Status: DC
  Administered 2014-11-17 (×2): 50 mg via ORAL
  Filled 2014-11-17 (×2): qty 1

## 2014-11-17 MED ORDER — INSULIN DETEMIR 100 UNIT/ML ~~LOC~~ SOLN
15.0000 [IU] | Freq: Every day | SUBCUTANEOUS | Status: DC
  Administered 2014-11-17: 15 [IU] via SUBCUTANEOUS
  Filled 2014-11-17 (×2): qty 0.15

## 2014-11-17 MED ORDER — DILTIAZEM HCL 60 MG PO TABS
60.0000 mg | ORAL_TABLET | Freq: Four times a day (QID) | ORAL | Status: DC
  Administered 2014-11-17 – 2014-11-19 (×8): 60 mg via ORAL
  Filled 2014-11-17 (×7): qty 1

## 2014-11-17 NOTE — Progress Notes (Signed)
[facility] PROGRESS NOTE  Peter Walters DTO:[id number] DOB: [birth year] DOA: 11/14/2014 PCP: Petra Kuba, MD  Assessment/Plan: Atrial fibrillation with rapid ventricular response -Yesterday we started 25 mg twice daily of metoprolol and 60 mg every 8 hours of Cardizem and were able to wean both Cardizem and amiodarone drips. -This morning he is again having bursts of rapid response as well as some nonsustained ventricular tachycardia. -We'll increase metoprolol to 50 mg twice daily and will also obtain 2-D echocardiogram as I do not see anything in the system. -We may need to restart some drips if his rate continued to be elevated.  Anemia/thrombocytopenia -Secondary to multiple myeloma. -Received 2 units of PRBCs on 6/3 for hemoglobin of 7.0. -Hemoglobin is slowly drifting down to 9.8 today. -He has had some nosebleeds overnight that are undoubtedly related to his thrombocytopenia with a platelet count of 39. -No need for platelet transfusion at present.  Acute on chronic kidney disease stage III -Baseline creatinine around 1.3-1.4 on account of his multiple myeloma. -Creatinine is at baseline at 1.2.  Multiple myeloma -Follow-up with oncology as an outpatient.  Diabetes mellitus  -With a hypoglycemic episode this morning, will decrease Lantus back to 15 units.    Code Status: DO NOT RESUSCITATE Family Communication: By Manuela Schwartz and sister-in-law at bedside updated on plan of care  Disposition Plan: Keep in ICU today as May possibly need to go back on IV drips   Consultants:  Cardiology   Antibiotics:  None   Subjective: Drowsy, received Ambien last night for the first time and per night nurse has been extremely far check.  Objective: Filed Vitals:   11/17/14 0600 11/17/14 0700 11/17/14 0711 11/17/14 0756  BP: 152/69 164/83    Pulse: 101 130 89   Temp:    99.1 F (37.3 C)  TempSrc:    Oral  Resp: 18 22 25    Height:      Weight:      SpO2:  100% 99% 99%     Intake/Output Summary (Last 24 hours) at 11/17/14 0907 Last data filed at 11/17/14 0711  Gross per 24 hour  Intake 2644.7 ml  Output    625 ml  Net 2019.7 ml   Filed Weights   11/15/14 0500 11/16/14 0500 11/17/14 0500  Weight: 65.4 kg (144 lb 2.9 oz) 65 kg (143 lb 4.8 oz) 64.6 kg (142 lb 6.7 oz)    Exam:   General:  Drowsy, barely arousable  Cardiovascular: Regular rate and rhythm  Respiratory: Tachycardic, irregular  Abdomen: Soft, nontender, nondistended, positive bowel sounds  Extremities: No clubbing, cyanosis or edema   Neurologic:  Unable to fully assess given current mental state  Data Reviewed: Basic Metabolic Panel:  Recent Labs Lab 11/12/14 0956 11/14/14 1318 11/14/14 1324 11/14/14 1841 11/15/14 0425 11/16/14 0508 11/17/14 0503  NA 119* 126* 134*  --  129* 132* 132*  K 5.6* 4.4 4.7  --  4.2 4.3 3.4*  CL 98* 103 108  --  111 110 110  CO2 17* 15*  --   --  16* 19* 16*  GLUCOSE 425* 376* 393*  --  245* 182* 66  BUN 60* 68* 72*  --  47* 28* 17  CREATININE 1.62* 1.94* 1.90* 1.85* 1.54* 1.23 1.21  CALCIUM 8.7* 8.6*  --  8.5* 8.0* 8.4* 8.0*  MG  --  1.9  --  1.9  --   --   --    Liver Function Tests:  Recent Labs  Lab 11/14/14 1318 11/15/14 0425  AST 19 19  ALT 34 29  ALKPHOS 42 39  BILITOT 1.3* 1.0  PROT 10.7* 9.7*  ALBUMIN 3.0* 2.7*   No results for input(s): LIPASE, AMYLASE in the last 168 hours. No results for input(s): AMMONIA in the last 168 hours. CBC:  Recent Labs Lab 11/12/14 0956 11/14/14 1318  11/14/14 1841 11/15/14 0425 11/16/14 0021 11/16/14 0508 11/17/14 0503  WBC 5.8 6.8  --  7.3 4.9 5.8 6.4 5.5  NEUTROABS 3.7 4.9  --   --   --   --   --   --   HGB 7.3* 7.4*  < > 7.4* 7.0* 10.2* 10.8* 9.6*  HCT 21.5* 21.9*  < > 21.6* 20.8* 29.4* 30.6* 28.2*  MCV 90.1 89.4  --  89.6 92.4 89.6 89.0 90.1  PLT 38* 38*  --  42* 37* 38* 39* 39*  < > = values in this interval not displayed. Cardiac Enzymes:  Recent  Labs Lab 11/14/14 1318 11/14/14 1841 11/14/14 2307 11/15/14 0425  TROPONINI 0.05* 0.04* 0.04* 0.04*   BNP (last 3 results) No results for input(s): BNP in the last 8760 hours.  ProBNP (last 3 results) No results for input(s): PROBNP in the last 8760 hours.  CBG:  Recent Labs Lab 11/16/14 0736 11/16/14 1147 11/16/14 1627 11/16/14 2026 11/17/14 0735  GLUCAP 154* 150* 134* 143* 67    Recent Results (from the past 240 hour(s))  MRSA PCR Screening     Status: None   Collection Time: 11/14/14  6:17 PM  Result Value Ref Range Status   MRSA by PCR NEGATIVE NEGATIVE Final    Comment:        The GeneXpert MRSA Assay (FDA approved for NASAL specimens only), is one component of a comprehensive MRSA colonization surveillance program. It is not intended to diagnose MRSA infection nor to guide or monitor treatment for MRSA infections.   Culture, blood (routine x 2)     Status: None (Preliminary result)   Collection Time: 11/15/14 12:40 AM  Result Value Ref Range Status   Specimen Description BLOOD LEFT HAND  Final   Special Requests   Final    BOTTLES DRAWN AEROBIC AND ANAEROBIC AEB 6CC ANA [place]   Culture NO GROWTH 1 DAY  Final   Report Status PENDING  Incomplete  Culture, blood (routine x 2)     Status: None (Preliminary result)   Collection Time: 11/15/14 12:55 AM  Result Value Ref Range Status   Specimen Description BLOOD RIGHT HAND  Final   Special Requests BOTTLES DRAWN AEROBIC AND ANAEROBIC [place]  Final   Culture NO GROWTH 1 DAY  Final   Report Status PENDING  Incomplete     Studies: No results found.  Scheduled Meds: . atorvastatin  40 mg Oral QHS  . atropine  1 drop Left Eye TID  . brimonidine  1 drop Left Eye TID  . calcium-vitamin D  1 tablet Oral Q breakfast  . diltiazem  60 mg Oral 4 times per day  . docusate sodium  100 mg Oral BID  . dorzolamide-timolol  1 drop Left Eye BID  . feeding supplement (GLUCERNA SHAKE)  237 mL Oral BID WC  . glipiZIDE   10 mg Oral BID WC  . heparin  5,000 Units Subcutaneous 3 times per day  . insulin aspart  0-5 Units Subcutaneous QHS  . insulin aspart  0-9 Units Subcutaneous TID WC  . insulin detemir  18 Units  Subcutaneous QHS  . latanoprost  1 drop Left Eye QHS  . magnesium oxide  400 mg Oral BID  . megestrol  40 mg Oral Daily  . metoprolol tartrate  50 mg Oral BID  . pantoprazole  40 mg Oral Daily  . prednisoLONE acetate  1 drop Left Eye QID  . sodium chloride  3 mL Intravenous Q12H   Continuous Infusions: . sodium chloride 75 mL/hr at 11/17/14 0450    Principal Problem:   Atrial fibrillation with rapid ventricular response Active Problems:   Anemia in neoplastic disease   Multiple myeloma in relapse   Atrial fibrillation with RVR   Diabetes   Dehydration   Acute on chronic renal failure   Faintness   History of recent hospitalization   NSVT (nonsustained ventricular tachycardia)    Time spent: 35 minutes. Greater than 50% of this time was spent in direct contact with the patient coordinating care.    Lelon Frohlich  [facility] Pager [phone]  If 7PM-7AM, please contact night-coverage at [email or web address], password [facility] 11/17/2014, 9:07 AM  LOS: 3 days

## 2014-11-17 NOTE — Progress Notes (Signed)
Patient still having intermittent nose bleeds. Also v-tach occurances become more numerous before patient was given lopressor tablet. Very infrequent at this time.

## 2014-11-17 NOTE — Progress Notes (Signed)
Patient had some runs of V-TACH when sitting on the side of the bed trying to urinate this morning.  His heart rate went as high as 180's and was only as high as 150's last night when attempting to void last night.

## 2014-11-18 ENCOUNTER — Inpatient Hospital Stay (HOSPITAL_COMMUNITY)

## 2014-11-18 DIAGNOSIS — I4891 Unspecified atrial fibrillation: Secondary | ICD-10-CM

## 2014-11-18 LAB — GLUCOSE, CAPILLARY
GLUCOSE-CAPILLARY: 69 mg/dL (ref 65–99)
GLUCOSE-CAPILLARY: 170 mg/dL — AB (ref 65–99)
GLUCOSE-CAPILLARY: 71 mg/dL (ref 65–99)
Glucose-Capillary: 59 mg/dL — ABNORMAL LOW (ref 65–99)
Glucose-Capillary: 158 mg/dL — ABNORMAL HIGH (ref 65–99)

## 2014-11-18 MED ORDER — GLUCOSE 40 % PO GEL
1.0000 | Freq: Once | ORAL | Status: AC
  Administered 2014-11-18: 37.5 g via ORAL
  Filled 2014-11-18: qty 1

## 2014-11-18 MED ORDER — INSULIN DETEMIR 100 UNIT/ML ~~LOC~~ SOLN
10.0000 [IU] | Freq: Every day | SUBCUTANEOUS | Status: DC
  Administered 2014-11-18: 10 [IU] via SUBCUTANEOUS
  Filled 2014-11-18 (×2): qty 0.1

## 2014-11-18 MED ORDER — METOPROLOL TARTRATE 50 MG PO TABS
75.0000 mg | ORAL_TABLET | Freq: Two times a day (BID) | ORAL | Status: DC
  Administered 2014-11-18 – 2014-11-19 (×3): 75 mg via ORAL
  Filled 2014-11-18 (×6): qty 1

## 2014-11-18 MED ORDER — POTASSIUM CHLORIDE 10 MEQ/100ML IV SOLN
10.0000 meq | INTRAVENOUS | Status: AC
  Administered 2014-11-18 (×4): 10 meq via INTRAVENOUS
  Filled 2014-11-18 (×4): qty 100

## 2014-11-18 NOTE — Progress Notes (Signed)
[facility] PROGRESS NOTE  Peter Walters GBT:[id number] DOB: 1936/01/10 DOA: 11/14/2014 PCP: Petra Kuba, MD  Assessment/Plan: A. fib/multifocal atrial tachycardia with rapid ventricular response -Continues to have episodes of rapid rate, metoprolol has been increased again to 75 mg twice daily by cardiology, continues on Cardizem 60 mg every 6 hours.  Anemia/thrombocytopenia -Secondary to multiple myeloma. -Received 2 units of PRBCs for hemoglobin of 7.0 on 6/3. -Continues to have some nosebleeds due to thrombocytopenia: No need for platelet transfusion at present. -Recheck CBC in a.m.  Acute on chronic kidney disease stage III -Creatinine is at baseline of 1.3-1.4.  Diabetes mellitus -Continues to have hypoglycemic episodes, will DC glipizide and decrease Lantus.  Multiple myeloma -Follow-up with oncology in the outpatient setting.  Code Status: DO NOT RESUSCITATE Family Communication: Wife Manuela Schwartz at bedside and updated on plan of care  Disposition Plan: Transfer to floor today, request palliative care consultation, patient is already a hospice patient at home but wife's wishes do not seem to be geared towards comfort care.   Consultants:  Cardiology   Antibiotics:  None   Subjective: Feels weak, nonspecific complaints  Objective: Filed Vitals:   11/18/14 1100 11/18/14 1200 11/18/14 1300 11/18/14 1445  BP: 128/58   160/66  Pulse: 73 67 69 70  Temp:    99.1 F (37.3 C)  TempSrc:    Oral  Resp: 13 17 20 20   Height:      Weight:      SpO2: 100% 100% 100% 100%    Intake/Output Summary (Last 24 hours) at 11/18/14 1539 Last data filed at 11/18/14 1321  Gross per 24 hour  Intake 2926.25 ml  Output   1625 ml  Net 1301.25 ml   Filed Weights   11/16/14 0500 11/17/14 0500 11/18/14 0500  Weight: 65 kg (143 lb 4.8 oz) 64.6 kg (142 lb 6.7 oz) 67.8 kg (149 lb 7.6 oz)    Exam:   General:  Alert, sleeping but arouses easily to  voice  Cardiovascular: Tachycardia, irregular  Respiratory: Clear to auscultation bilaterally  Abdomen: Soft, nontender, nondistended, positive bowel sounds  Extremities: No clubbing, cyanosis or edema   Neurologic:  Nonfocal  Data Reviewed: Basic Metabolic Panel:  Recent Labs Lab 11/12/14 0956 11/14/14 1318 11/14/14 1324 11/14/14 1841 11/15/14 0425 11/16/14 0508 11/17/14 0503  NA 119* 126* 134*  --  129* 132* 132*  K 5.6* 4.4 4.7  --  4.2 4.3 3.4*  CL 98* 103 108  --  111 110 110  CO2 17* 15*  --   --  16* 19* 16*  GLUCOSE 425* 376* 393*  --  245* 182* 66  BUN 60* 68* 72*  --  47* 28* 17  CREATININE 1.62* 1.94* 1.90* 1.85* 1.54* 1.23 1.21  CALCIUM 8.7* 8.6*  --  8.5* 8.0* 8.4* 8.0*  MG  --  1.9  --  1.9  --   --   --    Liver Function Tests:  Recent Labs Lab 11/14/14 1318 11/15/14 0425  AST 19 19  ALT 34 29  ALKPHOS 42 39  BILITOT 1.3* 1.0  PROT 10.7* 9.7*  ALBUMIN 3.0* 2.7*   No results for input(s): LIPASE, AMYLASE in the last 168 hours. No results for input(s): AMMONIA in the last 168 hours. CBC:  Recent Labs Lab 11/12/14 0956 11/14/14 1318  11/14/14 1841 11/15/14 0425 11/16/14 0021 11/16/14 0508 11/17/14 0503  WBC 5.8 6.8  --  7.3 4.9 5.8 6.4 5.5  NEUTROABS  3.7 4.9  --   --   --   --   --   --   HGB 7.3* 7.4*  < > 7.4* 7.0* 10.2* 10.8* 9.6*  HCT 21.5* 21.9*  < > 21.6* 20.8* 29.4* 30.6* 28.2*  MCV 90.1 89.4  --  89.6 92.4 89.6 89.0 90.1  PLT 38* 38*  --  42* 37* 38* 39* 39*  < > = values in this interval not displayed. Cardiac Enzymes:  Recent Labs Lab 11/14/14 1318 11/14/14 1841 11/14/14 2307 11/15/14 0425  TROPONINI 0.05* 0.04* 0.04* 0.04*   BNP (last 3 results) No results for input(s): BNP in the last 8760 hours.  ProBNP (last 3 results) No results for input(s): PROBNP in the last 8760 hours.  CBG:  Recent Labs Lab 11/17/14 1630 11/17/14 1717 11/17/14 2102 11/18/14 0730 11/18/14 1119  GLUCAP 58* 67 142* 69 170*     Recent Results (from the past 240 hour(s))  MRSA PCR Screening     Status: None   Collection Time: 11/14/14  6:17 PM  Result Value Ref Range Status   MRSA by PCR NEGATIVE NEGATIVE Final    Comment:        The GeneXpert MRSA Assay (FDA approved for NASAL specimens only), is one component of a comprehensive MRSA colonization surveillance program. It is not intended to diagnose MRSA infection nor to guide or monitor treatment for MRSA infections.   Culture, blood (routine x 2)     Status: None (Preliminary result)   Collection Time: 11/15/14 12:40 AM  Result Value Ref Range Status   Specimen Description BLOOD LEFT HAND  Final   Special Requests   Final    BOTTLES DRAWN AEROBIC AND ANAEROBIC AEB=6CC ANA= 8CC   Culture NO GROWTH 3 DAYS  Final   Report Status PENDING  Incomplete  Culture, blood (routine x 2)     Status: None (Preliminary result)   Collection Time: 11/15/14 12:55 AM  Result Value Ref Range Status   Specimen Description BLOOD RIGHT HAND  Final   Special Requests BOTTLES DRAWN AEROBIC AND ANAEROBIC 6CC EACH  Final   Culture NO GROWTH 3 DAYS  Final   Report Status PENDING  Incomplete     Studies: No results found.  Scheduled Meds: . atorvastatin  40 mg Oral QHS  . atropine  1 drop Left Eye TID  . brimonidine  1 drop Left Eye TID  . calcium-vitamin D  1 tablet Oral Q breakfast  . diltiazem  60 mg Oral 4 times per day  . docusate sodium  100 mg Oral BID  . dorzolamide-timolol  1 drop Left Eye BID  . feeding supplement (GLUCERNA SHAKE)  237 mL Oral BID WC  . glipiZIDE  10 mg Oral BID WC  . heparin  5,000 Units Subcutaneous 3 times per day  . insulin aspart  0-5 Units Subcutaneous QHS  . insulin aspart  0-9 Units Subcutaneous TID WC  . insulin detemir  15 Units Subcutaneous QHS  . latanoprost  1 drop Left Eye QHS  . magnesium oxide  400 mg Oral BID  . megestrol  40 mg Oral Daily  . metoprolol tartrate  75 mg Oral BID  . pantoprazole  40 mg Oral Daily  .  prednisoLONE acetate  1 drop Left Eye QID  . sodium chloride  3 mL Intravenous Q12H   Continuous Infusions: . sodium chloride 75 mL/hr at 11/18/14 1321    Principal Problem:   Atrial fibrillation with rapid  ventricular response Active Problems:   Anemia in neoplastic disease   Multiple myeloma in relapse   Atrial fibrillation with RVR   Diabetes   Dehydration   Acute on chronic renal failure   Faintness   History of recent hospitalization   NSVT (nonsustained ventricular tachycardia)    Time spent: 30 minutes. Greater than 50% of this time was spent in direct contact with the patient coordinating care.    Lelon Frohlich  [facility] Pager [phone]  If 7PM-7AM, please contact night-coverage at [email or web address], password [facility] 11/18/2014, 3:39 PM  LOS: 4 days

## 2014-11-18 NOTE — Progress Notes (Signed)
Temp 100.8.  Tylenol 650 mg given for temp and discomfort.

## 2014-11-18 NOTE — Progress Notes (Signed)
Patient transferred to room 312. Report given to [facility] LPN. Vital signs stable at tranfer.

## 2014-11-18 NOTE — Progress Notes (Signed)
Patient ID: Peter Walters, male   DOB: 07-26-1935, 79 y.o.   MRN: [id number]    P  Subjective:    No palpitations, no SOB  Objective:   Temp:  [98.1 F (36.7 C)-99.4 F (37.4 C)] 98.1 F (36.7 C) (06/06 0400) Pulse Rate:  [58-92] 62 (06/06 0600) Resp:  [12-22] 14 (06/06 0600) BP: (127-170)/(62-86) 143/69 mmHg (06/06 0600) SpO2:  [98 %-100 %] 99 % (06/06 0600) Weight:  [149 lb 7.6 oz (67.8 kg)] 149 lb 7.6 oz (67.8 kg) (06/06 0500) Last BM Date: 11/16/14  Filed Weights   11/16/14 0500 11/17/14 0500 11/18/14 0500  Weight: 143 lb 4.8 oz (65 kg) 142 lb 6.7 oz (64.6 kg) 149 lb 7.6 oz (67.8 kg)    Intake/Output Summary (Last 24 hours) at 11/18/14 0816 Last data filed at 11/18/14 0600  Gross per 24 hour  Intake   2500 ml  Output   2125 ml  Net    375 ml    Telemetry: MAT, runs of NSVT  Exam:  General: NAD  Resp: CAB  Cardiac: irreg, no m/r/g, no JVD  GI: abdomen soft, NT, ND  MSK:no LE  edema  Neuro: no focal deficits    Lab Results:  Basic Metabolic Panel:  Recent Labs Lab 11/14/14 1318  11/14/14 1841 11/15/14 0425 11/16/14 0508 11/17/14 0503  NA 126*  < >  --  129* 132* 132*  K 4.4  < >  --  4.2 4.3 3.4*  CL 103  < >  --  111 110 110  CO2 15*  --   --  16* 19* 16*  GLUCOSE 376*  < >  --  245* 182* 66  BUN 68*  < >  --  47* 28* 17  CREATININE 1.94*  < > 1.85* 1.54* 1.23 1.21  CALCIUM 8.6*  --  8.5* 8.0* 8.4* 8.0*  MG 1.9  --  1.9  --   --   --   < > = values in this interval not displayed.  Liver Function Tests:  Recent Labs Lab 11/14/14 1318 11/15/14 0425  AST 19 19  ALT 34 29  ALKPHOS 42 39  BILITOT 1.3* 1.0  PROT 10.7* 9.7*  ALBUMIN 3.0* 2.7*    CBC:  Recent Labs Lab 11/16/14 0021 11/16/14 0508 11/17/14 0503  WBC 5.8 6.4 5.5  HGB 10.2* 10.8* 9.6*  HCT 29.4* 30.6* 28.2*  MCV 89.6 89.0 90.1  PLT 38* 39* 39*    Cardiac Enzymes:  Recent Labs Lab 11/14/14 1841 11/14/14 2307 11/15/14 0425  TROPONINI 0.04* 0.04* 0.04*      BNP: No results for input(s): PROBNP in the last 8760 hours.  Coagulation:  Recent Labs Lab 11/14/14 1318  INR 1.46    ECG:   Medications:   Scheduled Medications: . atorvastatin  40 mg Oral QHS  . atropine  1 drop Left Eye TID  . brimonidine  1 drop Left Eye TID  . calcium-vitamin D  1 tablet Oral Q breakfast  . diltiazem  60 mg Oral 4 times per day  . docusate sodium  100 mg Oral BID  . dorzolamide-timolol  1 drop Left Eye BID  . feeding supplement (GLUCERNA SHAKE)  237 mL Oral BID WC  . glipiZIDE  10 mg Oral BID WC  . heparin  5,000 Units Subcutaneous 3 times per day  . insulin aspart  0-5 Units Subcutaneous QHS  . insulin aspart  0-9 Units Subcutaneous TID WC  . insulin  detemir  15 Units Subcutaneous QHS  . latanoprost  1 drop Left Eye QHS  . magnesium oxide  400 mg Oral BID  . megestrol  40 mg Oral Daily  . metoprolol tartrate  50 mg Oral BID  . pantoprazole  40 mg Oral Daily  . prednisoLONE acetate  1 drop Left Eye QID  . sodium chloride  3 mL Intravenous Q12H     Infusions: . sodium chloride 75 mL/hr at 11/18/14 0529     PRN Medications:  acetaminophen, ondansetron **OR** ondansetron (ZOFRAN) IV, oxyCODONE-acetaminophen, zolpidem     Assessment/Plan   79 yo male hx of PAF, HTN, CAD with prior PCI, multiple myelolma, DNR status admitted with AMS  1. Afib/Tachycardia - had been on both dilt gtt and amio gtt - converted to orals over the weekend, currently on dilt 60mg  q6 hrs, lopressor 50mg  bid. BP's remain stable and actually elevated.  - has not been on anticoag due to severe anemia and thrombocytopenia - currently in MAT with runs of tachycardia. Will increase lopressor to 75mg  bid - K 3.4, Mg 1.9. Will give 40 mEq of KCl IV since he has trouble swallowing at times.  - f/u echo results     Carlyle Dolly, M.D.

## 2014-11-18 NOTE — Progress Notes (Signed)
Inpatient Diabetes Program Recommendations  AACE/ADA: New Consensus Statement on Inpatient Glycemic Control (2013)  Target Ranges:  Prepandial:   less than 140 mg/dL      Peak postprandial:   less than 180 mg/dL (1-2 hours)      Critically ill patients:  140 - 180 mg/dL   Results for Peter Walters, Peter Walters (MRN [id number]) as of 11/18/2014 08:45  Ref. Range 11/17/2014 07:35 11/17/2014 11:28 11/17/2014 16:30 11/17/2014 17:17 11/17/2014 21:02 11/18/2014 07:30  Glucose-Capillary Latest Ref Range: 65-99 mg/dL 67 162 (H) 58 (L) 67 142 (H) 69   Current orders for Inpatient glycemic control: Levemir 15 units QHS, Glipizide 10 mg BID, Novolog 0-9 units TID with meals, Novolog 0-5 units HS  Inpatient Diabetes Program Recommendations Insulin - Basal: Patient received Levemir 15 units last night and fasting glucose 69 mg/dl this morning. Please consider decreasing Levemir to 12 units QHS. Oral Agents: Noted patient only received morning dose of Glipizide 10 mg yesterday. Despite only receiving Glipizide once yesterday he still experienced hypoglycemia twice yesterday and fasting glucose is 69 mg/dl this morning.  Please consider decreasing Glipizide to 10 mg QAM.  Thanks, Barnie Alderman, RN, MSN, CCRN, CDE Diabetes Coordinator Inpatient Diabetes Program [phone] (Team Pager from [place] to [place]) [phone] ([facility] office) [phone] [facility] office) [phone] [facility] office)

## 2014-11-18 NOTE — Progress Notes (Addendum)
CRITICAL VALUE ALERT  Critical value received:  CBG 59  Date of notification:  11/18/2014  Time of notification:  17:00  Critical value read back:Yes.    Nurse who received alert:  Radene Gunning LPN  MD notified (1st page):  Dr Jerilee Hoh  Time of first page:  17:00  MD notified (2nd page):  Time of second page:  Responding MD: Dr Cherly Hensen  Time MD responded: 17:04

## 2014-11-19 LAB — CBC
HEMATOCRIT: 22.2 % — AB (ref 39.0–52.0)
Hemoglobin: 7.5 g/dL — ABNORMAL LOW (ref 13.0–17.0)
MCH: 31 pg (ref 26.0–34.0)
MCHC: 33.8 g/dL (ref 30.0–36.0)
MCV: 91.7 fL (ref 78.0–100.0)
PLATELETS: 41 10*3/uL — AB (ref 150–400)
RBC: 2.42 MIL/uL — ABNORMAL LOW (ref 4.22–5.81)
RDW: 17.1 % — AB (ref 11.5–15.5)
WBC: 3.5 10*3/uL — AB (ref 4.0–10.5)

## 2014-11-19 LAB — BASIC METABOLIC PANEL
ANION GAP: 5 (ref 5–15)
BUN: 13 mg/dL (ref 6–20)
CO2: 16 mmol/L — AB (ref 22–32)
CREATININE: 1.12 mg/dL (ref 0.61–1.24)
Calcium: 7.7 mg/dL — ABNORMAL LOW (ref 8.9–10.3)
Chloride: 110 mmol/L (ref 101–111)
GFR calc Af Amer: >60 mL/min (ref >60)
GFR calc non Af Amer: >60 mL/min (ref >60)
Glucose, Bld: 112 mg/dL — ABNORMAL HIGH (ref 65–99)
POTASSIUM: 3.7 mmol/L (ref 3.5–5.1)
Sodium: 131 mmol/L — ABNORMAL LOW (ref 135–145)

## 2014-11-19 LAB — MAGNESIUM: Magnesium: 1.8 mg/dL (ref 1.7–2.4)

## 2014-11-19 LAB — GLUCOSE, CAPILLARY
Glucose-Capillary: 70 mg/dL (ref 65–99)
Glucose-Capillary: 121 mg/dL — ABNORMAL HIGH (ref 65–99)

## 2014-11-19 MED ORDER — OXYCODONE-ACETAMINOPHEN 5-325 MG PO TABS: 1.0000 | ORAL_TABLET | Freq: Four times a day (QID) | ORAL | Status: DC | PRN

## 2014-11-19 MED ORDER — ACETAMINOPHEN 325 MG PO TABS: 650.0000 mg | ORAL_TABLET | Freq: Four times a day (QID) | ORAL | Status: AC | PRN

## 2014-11-19 MED ORDER — METOPROLOL TARTRATE 75 MG PO TABS: 75.0000 mg | ORAL_TABLET | Freq: Two times a day (BID) | ORAL | Status: AC

## 2014-11-19 MED ORDER — HEPARIN SOD (PORK) LOCK FLUSH 100 UNIT/ML IV SOLN
500.0000 [IU] | INTRAVENOUS | Status: AC | PRN
  Administered 2014-11-19: 500 [IU]
  Filled 2014-11-19: qty 5

## 2014-11-19 MED ORDER — DILTIAZEM HCL ER COATED BEADS 240 MG PO CP24
240.0000 mg | ORAL_CAPSULE | Freq: Every day | ORAL | Status: DC
  Administered 2014-11-19: 240 mg via ORAL
  Filled 2014-11-19: qty 1

## 2014-11-19 MED ORDER — INSULIN DETEMIR 100 UNIT/ML ~~LOC~~ SOLN: 10.0000 [IU] | Freq: Every day | SUBCUTANEOUS | Status: AC

## 2014-11-19 NOTE — Progress Notes (Signed)
Patient ID: Peter Walters, male   DOB: April 11, 1936, 79 y.o.   MRN: [id number]    Primary cardiologist:  Subjective:      Objective:   Temp:  [99 F (37.2 C)-100.8 F (38.2 C)] 99 F (37.2 C) (06/07 0527) Pulse Rate:  [56-78] 73 (06/07 0527) Resp:  [12-20] 20 (06/07 0527) BP: (123-160)/(58-68) 123/68 mmHg (06/07 0554) SpO2:  [100 %] 100 % (06/07 0527) Last BM Date: 11/17/14  Filed Weights   11/16/14 0500 11/17/14 0500 11/18/14 0500  Weight: 143 lb 4.8 oz (65 kg) 142 lb 6.7 oz (64.6 kg) 149 lb 7.6 oz (67.8 kg)    Intake/Output Summary (Last 24 hours) at 11/19/14 0902 Last data filed at 11/19/14 0858  Gross per 24 hour  Intake   3040 ml  Output   1525 ml  Net   1515 ml    Telemetry:  Exam:  General:  HEENT:  Resp:  Cardiac:  GI:  MSK:  Neuro:   Psych  Lab Results:  Basic Metabolic Panel:  Recent Labs Lab 11/14/14 1318  11/14/14 1841  11/16/14 0508 11/17/14 0503 11/19/14 0534  NA 126*  < >  --   < > 132* 132* 131*  K 4.4  < >  --   < > 4.3 3.4* 3.7  CL 103  < >  --   < > 110 110 110  CO2 15*  --   --   < > 19* 16* 16*  GLUCOSE 376*  < >  --   < > 182* 66 112*  BUN 68*  < >  --   < > 28* 17 13  CREATININE 1.94*  < > 1.85*  < > 1.23 1.21 1.12  CALCIUM 8.6*  --  8.5*  < > 8.4* 8.0* 7.7*  MG 1.9  --  1.9  --   --   --  1.8  < > = values in this interval not displayed.  Liver Function Tests:  Recent Labs Lab 11/14/14 1318 11/15/14 0425  AST 19 19  ALT 34 29  ALKPHOS 42 39  BILITOT 1.3* 1.0  PROT 10.7* 9.7*  ALBUMIN 3.0* 2.7*    CBC:  Recent Labs Lab 11/16/14 0508 11/17/14 0503 11/19/14 0534  WBC 6.4 5.5 3.5*  HGB 10.8* 9.6* 7.5*  HCT 30.6* 28.2* 22.2*  MCV 89.0 90.1 91.7  PLT 39* 39* 41*    Cardiac Enzymes:  Recent Labs Lab 11/14/14 1841 11/14/14 2307 11/15/14 0425  TROPONINI 0.04* 0.04* 0.04*    BNP: No results for input(s): PROBNP in the last 8760 hours.  Coagulation:  Recent Labs Lab 11/14/14 1318  INR  1.46    ECG:   Medications:   Scheduled Medications: . atorvastatin  40 mg Oral QHS  . atropine  1 drop Left Eye TID  . brimonidine  1 drop Left Eye TID  . calcium-vitamin D  1 tablet Oral Q breakfast  . diltiazem  60 mg Oral 4 times per day  . docusate sodium  100 mg Oral BID  . dorzolamide-timolol  1 drop Left Eye BID  . feeding supplement (GLUCERNA SHAKE)  237 mL Oral BID WC  . heparin  5,000 Units Subcutaneous 3 times per day  . insulin aspart  0-5 Units Subcutaneous QHS  . insulin aspart  0-9 Units Subcutaneous TID WC  . insulin detemir  10 Units Subcutaneous QHS  . latanoprost  1 drop Left Eye QHS  . magnesium oxide  400 mg  Oral BID  . megestrol  40 mg Oral Daily  . metoprolol tartrate  75 mg Oral BID  . pantoprazole  40 mg Oral Daily  . prednisoLONE acetate  1 drop Left Eye QID  . sodium chloride  3 mL Intravenous Q12H     Infusions: . sodium chloride 1 mL (11/19/14 0315)     PRN Medications:  acetaminophen, ondansetron **OR** ondansetron (ZOFRAN) IV, oxyCODONE-acetaminophen, zolpidem     Assessment/Plan    79 yo male hx of PAF, HTN, CAD with prior PCI, multiple myelolma, DNR status admitted with AMS  1. Afib/Tachycardia - had been on both dilt gtt and amio gtt - converted to orals over the weekend, currently on dilt 60mg  q6 hrs, lopressor 75mg  bid. BP's remain stable  - has not been on anticoag due to severe anemia and thrombocytopenia - echo was overall normal - yesterday was having runs of MAT as opposed to afib. This AM and overnight NSR with occasional PVCs. Will consolidate diltiazem to long acting 240mg  daily - will monitor telemetry tomorrow but otherwise will sign off.        Carlyle Dolly, M.D

## 2014-11-19 NOTE — Care Management Note (Signed)
Case Management Note  Patient Details  Name: QUINNLAN ABRUZZO MRN: [id number] Date of Birth: 03/29/1936  Expected Discharge Date:  11/18/14               Expected Discharge Plan:  Home w Hospice Care  In-House Referral:  NA  Discharge planning Services  CM Consult  Post Acute Care Choice:  NA Choice offered to:  NA  DME Arranged:    DME Agency:     HH Arranged:    [place] Agency:     Status of Service:  Completed, signed off  Medicare Important Message Given:  Yes Date Medicare IM Given:  11/19/14 Medicare IM give by:  Jolene Provost, RN, MSN, CM  Date Additional Medicare IM Given:    Additional Medicare Important Message give by:     If discussed at [place] of Stay Meetings, dates discussed:    Additional Comments: Pt discharging home today with resumption of hospice services. Pt's wife given list of PD care providers and will try to find support for the nighttime hours. Pt's RN will fax DC summary to [facility] when available. No further CM needs.  Sherald Barge, RN 11/19/2014, 1:43 PM

## 2014-11-19 NOTE — Progress Notes (Signed)
Pt c/o left arm pain and swelling, per family and pt swelling is new.  Paged Dr Jerilee Hoh to notify, administered PRN pain med, will continue to monitor.

## 2014-11-19 NOTE — Progress Notes (Signed)
Afebrile.  Resting with eyes closed.  Tissues on bedside table noted with scant amount of blood.  Patient stated his nose was bleeding "a little".  Will continue to monitor.

## 2014-11-19 NOTE — Progress Notes (Addendum)
Heparin flushed port and de accessed, removed telemetry.  Pt tolerated well.  Discharge summary faxed to [facility]. , Reviewed discharge information with pt and wife, answered all questions.

## 2014-11-19 NOTE — Discharge Summary (Signed)
Physician Discharge Summary  Peter Walters XTK:[id number] DOB: [birth year] DOA: 11/14/2014  PCP: Petra Kuba, MD  Admit date: 11/14/2014 Discharge date: 11/19/2014  Time spent: 45 minutes  Recommendations for Outpatient Follow-up:  - Will be discharged home today with home hospice care. -Advised to follow-up with his oncologist in [place] as previous scheduled.  Discharge Diagnoses:  Principal Problem:   Atrial fibrillation with rapid ventricular response Active Problems:   Anemia in neoplastic disease   Multiple myeloma in relapse   Atrial fibrillation with RVR   Diabetes   Dehydration   Acute on chronic renal failure   Faintness   History of recent hospitalization   NSVT (nonsustained ventricular tachycardia)   Discharge Condition: Improved, stable  Filed Weights   11/16/14 0500 11/17/14 0500 11/18/14 0500  Weight: 65 kg (143 lb 4.8 oz) 64.6 kg (142 lb 6.7 oz) 67.8 kg (149 lb 7.6 oz)    History of present illness:  This is a 79 year old man who appears to have endstage myeloma. He is unable to you me any clear history as he says he doesn't know what happened to him. Apparently, he was brought to the emergency room via EMS after having been found unresponsive in his home. He has a history of atrial fibrillation in the past, hypertension, hyperlipidemia, coronary artery disease and congestive heart failure. 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 He is currently on chemotherapy for myeloma but there is question as to how long this can be continued. When he presented to the emergency room he was in atrial fibrillation with rapid ventricular response. The ventricular rate was as high as 180. He has been started on intravenous Cardizem drip and also amiodarone bolus followed by amiodarone drip. The patient denies chest pain, palpitations, dyspnea. Cardiology has been consulted. Is now being admitted for further management.  Hospital Course:   A.  fib/multifocal atrial tachycardia with rapid ventricular response -Has converted to sinus rhythm. Medications have been changed to metoprolol 75 mg twice daily as well as Cardizem 240 mg daily.  Anemia/thrombocytopenia -Secondary to multiple myeloma. -Received 2 units of PRBCs for hemoglobin of 7.0 on 6/3. -Continues to have some nosebleeds due to thrombocytopenia, no need for platelet transfusion at this time, platelet count is 39 on discharge.  Acute on chronic kidney disease stage III -Creatinine has returned to his baseline of 1.3-1.4.  Diabetes mellitus -Due to continued hypoglycemic episodes, his glipizide has been discontinued indefinitely and his Lantus has been decreased from 15-10 units daily.  Multiple myeloma  -not actively receiving treatment, under hospice care, follow-up with his oncologist as scheduled.  Procedures:  None   Consultations:  Cardiology  Discharge Instructions  Discharge Instructions    Increase activity slowly    Complete by:  As directed             Medication List    STOP taking these medications        doxazosin 2 MG tablet  Commonly known as:  CARDURA     glipiZIDE 10 MG 24 hr tablet  Commonly known as:  GLUCOTROL XL     ranitidine 150 MG tablet  Commonly known as:  ZANTAC      TAKE these medications        acetaminophen 325 MG tablet  Commonly known as:  TYLENOL  Take 2 tablets (650 mg total) by mouth every 6 (six) hours as needed for mild pain, fever or headache.  atorvastatin 40 MG tablet  Commonly known as:  LIPITOR  Take 40 mg by mouth at bedtime.     atropine 1 % ophthalmic ointment  Administer 1 application into the left eye Two (2) times a day.     brimonidine 0.2 % ophthalmic solution  Commonly known as:  ALPHAGAN  Administer 1 drop into the left eye Three (3) times a day.     calcium-vitamin D 500-200 MG-UNIT per tablet  Commonly known as:  OSCAL WITH D  Take 1 tablet by mouth daily with breakfast.       diltiazem 240 MG 24 hr capsule  Commonly known as:  CARDIZEM CD  Take 1 capsule (240 mg total) by mouth daily.     dorzolamide-timolol 22.3-6.8 MG/ML ophthalmic solution  Commonly known as:  COSOPT  Place 1 drop into the left eye 2 (two) times daily.     feeding supplement (GLUCERNA SHAKE) Liqd  Take 237 mLs by mouth 2 (two) times daily with a meal.     insulin detemir 100 UNIT/ML injection  Commonly known as:  LEVEMIR  Inject 0.1 mLs (10 Units total) into the skin at bedtime.     latanoprost 0.005 % ophthalmic solution  Commonly known as:  XALATAN  Administer 1 drop into the left eye nightly.     lidocaine-prilocaine cream  Commonly known as:  EMLA  Apply 1 application topically once.     loperamide 2 MG tablet  Commonly known as:  IMODIUM A-D  Take 1 tablet (2 mg total) by mouth 4 (four) times daily as needed for diarrhea or loose stools.     magnesium oxide 400 MG tablet  Commonly known as:  MAG-OX  Take 400 mg by mouth 2 (two) times daily.     megestrol 40 MG/ML suspension  Commonly known as:  MEGACE  Take 40 mg by mouth daily.     Metoprolol Tartrate 75 MG Tabs  Take 75 mg by mouth 2 (two) times daily.     ondansetron 8 MG disintegrating tablet  Commonly known as:  ZOFRAN-ODT  Take 1 tablet (8 mg total) by mouth every 8 (eight) hours as needed for nausea.     oxyCODONE-acetaminophen 5-325 MG per tablet  Commonly known as:  PERCOCET/ROXICET  Take 1 tablet by mouth every 6 (six) hours as needed (pain).     pantoprazole 40 MG tablet  Commonly known as:  PROTONIX  Take 40 mg by mouth daily.     prednisoLONE acetate 1 % ophthalmic suspension  Commonly known as:  PRED FORTE  Administer 1 drop into the left eye Four (4) times a day.       Allergies  Allergen Reactions  . Aspirin Hives and Other (See Comments)    Reaction:  Causes pt to bleed.   . Celecoxib Other (See Comments)    Reaction:  Causes pt to bleed.       The results of significant  diagnostics from this hospitalization (including imaging, microbiology, ancillary and laboratory) are listed below for reference.    Significant Diagnostic Studies: Ct Head Wo Contrast  11/14/2014   CLINICAL DATA:  Unresponsive.  Undergoing chemotherapy for myeloma.  EXAM: CT HEAD WITHOUT CONTRAST  TECHNIQUE: Contiguous axial images were obtained from the base of the skull through the vertex without intravenous contrast.  COMPARISON:  None.  FINDINGS: Mild atrophy with diffuse sulcal prominence and prominence of the bifrontal extra-axial spaces. Scattered minimal periventricular hypodensities suggestive of microvascular ischemic disease. Bilateral basal  ganglial calcifications. No CT evidence of acute large territory infarct. No intraparenchymal or extra-axial mass or hemorrhage. Normal size and configuration of the ventricles and basilar cisterns. No midline shift. Intracranial atherosclerosis. Limited visualization of the paranasal sinuses and mastoid air cells is normal. No air-fluid levels. Regional soft tissues appear normal. Post bilateral cataract surgery. No displaced calvarial fracture.  IMPRESSION: Atrophy and microvascular ischemic disease without acute intracranial process.   Electronically Signed   By: Sandi Mariscal M.D.   On: 11/14/2014 16:41   Dg Chest Portable 1 View  11/14/2014   CLINICAL DATA:  Multiple myeloma.  Atrial fibrillation  EXAM: PORTABLE CHEST - 1 VIEW  COMPARISON:  December 17, 2013  FINDINGS: There is no edema or consolidation. No mass or adenopathy. Heart size and pulmonary vascularity are normal. Port-A-Cath tip is in the superior vena cava. No pneumothorax. No blastic or lytic bone lesions are appreciable.  IMPRESSION: No edema or consolidation.   Electronically Signed   By: Lowella Grip III M.D.   On: 11/14/2014 13:37    Microbiology: Recent Results (from the past 240 hour(s))  MRSA PCR Screening     Status: None   Collection Time: 11/14/14  6:17 PM  Result Value Ref  Range Status   MRSA by PCR NEGATIVE NEGATIVE Final    Comment:        The GeneXpert MRSA Assay (FDA approved for NASAL specimens only), is one component of a comprehensive MRSA colonization surveillance program. It is not intended to diagnose MRSA infection nor to guide or monitor treatment for MRSA infections.   Culture, blood (routine x 2)     Status: None (Preliminary result)   Collection Time: 11/15/14 12:40 AM  Result Value Ref Range Status   Specimen Description BLOOD LEFT HAND  Final   Special Requests   Final    BOTTLES DRAWN AEROBIC AND ANAEROBIC AEB=6CC ANA= 8CC   Culture NO GROWTH 4 DAYS  Final   Report Status PENDING  Incomplete  Culture, blood (routine x 2)     Status: None (Preliminary result)   Collection Time: 11/15/14 12:55 AM  Result Value Ref Range Status   Specimen Description BLOOD RIGHT HAND  Final   Special Requests BOTTLES DRAWN AEROBIC AND ANAEROBIC [facility] EACH  Final   Culture NO GROWTH 4 DAYS  Final   Report Status PENDING  Incomplete     Labs: Basic Metabolic Panel:  Recent Labs Lab 11/14/14 1318 11/14/14 1324 11/14/14 1841 11/15/14 0425 11/16/14 0508 11/17/14 0503 11/19/14 0534  NA 126* 134*  --  129* 132* 132* 131*  K 4.4 4.7  --  4.2 4.3 3.4* 3.7  CL 103 108  --  111 110 110 110  CO2 15*  --   --  16* 19* 16* 16*  GLUCOSE 376* 393*  --  245* 182* 66 112*  BUN 68* 72*  --  47* 28* 17 13  CREATININE 1.94* 1.90* 1.85* 1.54* 1.23 1.21 1.12  CALCIUM 8.6*  --  8.5* 8.0* 8.4* 8.0* 7.7*  MG 1.9  --  1.9  --   --   --  1.8   Liver Function Tests:  Recent Labs Lab 11/14/14 1318 11/15/14 0425  AST 19 19  ALT 34 29  ALKPHOS 42 39  BILITOT 1.3* 1.0  PROT 10.7* 9.7*  ALBUMIN 3.0* 2.7*   No results for input(s): LIPASE, AMYLASE in the last 168 hours. No results for input(s): AMMONIA in the last 168 hours. CBC:  Recent Labs Lab 11/14/14 1318  11/15/14 0425 11/16/14 0021 11/16/14 0508 11/17/14 0503 11/19/14 0534  WBC 6.8  < > 4.9  5.8 6.4 5.5 3.5*  NEUTROABS 4.9  --   --   --   --   --   --   HGB 7.4*  < > 7.0* 10.2* 10.8* 9.6* 7.5*  HCT 21.9*  < > 20.8* 29.4* 30.6* 28.2* 22.2*  MCV 89.4  < > 92.4 89.6 89.0 90.1 91.7  PLT 38*  < > 37* 38* 39* 39* 41*  < > = values in this interval not displayed. Cardiac Enzymes:  Recent Labs Lab 11/14/14 1318 11/14/14 1841 11/14/14 2307 11/15/14 0425  TROPONINI 0.05* 0.04* 0.04* 0.04*   BNP: BNP (last 3 results) No results for input(s): BNP in the last 8760 hours.  ProBNP (last 3 results) No results for input(s): PROBNP in the last 8760 hours.  CBG:  Recent Labs Lab 11/18/14 1644 11/18/14 1728 11/18/14 2049 11/19/14 0758 11/19/14 1119  GLUCAP 59* 71 158* 70 121*       Signed:  [facility] Pager: [phone] 11/19/2014, 2:28 PM

## 2014-11-19 NOTE — Care Management Note (Signed)
Case Management Note  Patient Details  Name: Peter Walters MRN: [id number] Date of Birth: 07/18/1935  Expected Discharge Date:  11/18/14               Expected Discharge Plan:  Home w Hospice Care  In-House Referral:  NA  Discharge planning Services  CM Consult  Post Acute Care Choice:  NA Choice offered to:  NA  DME Arranged:    DME Agency:     HH Arranged:    HH Agency:     Status of Service:  In process, will continue to follow  Medicare Important Message Given:    Date Medicare IM Given:    Medicare IM give by:    Date Additional Medicare IM Given:    Additional Medicare Important Message give by:     If discussed at [place] of Stay Meetings, dates discussed: 11/19/2014    Additional Comments:  Sherald Barge, RN 11/19/2014, 12:34 PM

## 2014-11-20 LAB — CULTURE, BLOOD (ROUTINE X 2)
Culture: NO GROWTH
Culture: NO GROWTH

## 2014-12-11 ENCOUNTER — Telehealth: Payer: Self-pay | Admitting: *Deleted

## 2014-12-11 NOTE — Telephone Encounter (Signed)
Order for iv fluids faxed to hospice.

## 2014-12-12 ENCOUNTER — Telehealth: Payer: Self-pay | Admitting: *Deleted

## 2014-12-13 NOTE — Telephone Encounter (Signed)
Faxed order on 12/12/14

## 2014-12-31 ENCOUNTER — Telehealth: Payer: Self-pay | Admitting: *Deleted

## 2014-12-31 MED ORDER — DEXAMETHASONE 4 MG PO TABS: 4.0000 mg | ORAL_TABLET | Freq: Every day | ORAL | Status: AC

## 2014-12-31 NOTE — Telephone Encounter (Signed)
Spoke with Peter Walters adn she will call in Dexamethasone and speak with wife regarding lab draw

## 2014-12-31 NOTE — Telephone Encounter (Signed)
Having severe h/a's and asking if she can have order for dexamethasone. If agree, can the Megace be stopped? Wife does not feel it is doing any good. Also having bony pains. Wife asking if labs need to be checked to see what his platlet count is doing

## 2014-12-31 NOTE — Telephone Encounter (Signed)
Ok to stop megace and start dexamethasone 4mg  daily.  Can check cbc if she wants, but would likely not change the plan.

## 2015-01-07 ENCOUNTER — Telehealth: Payer: Self-pay | Admitting: *Deleted

## 2015-01-07 ENCOUNTER — Other Ambulatory Visit: Payer: Self-pay

## 2015-01-07 ENCOUNTER — Other Ambulatory Visit: Payer: Self-pay | Admitting: *Deleted

## 2015-01-07 MED ORDER — OXYCODONE-ACETAMINOPHEN 5-325 MG PO TABS: 1.0000 | ORAL_TABLET | Freq: Four times a day (QID) | ORAL | Status: AC | PRN

## 2015-01-07 NOTE — Telephone Encounter (Signed)
Verbal order left for [place], Hospice RN. Heparin 100 units/ml syringe, flush with 500 units prn

## 2015-01-07 NOTE — Telephone Encounter (Signed)
rx faxed to pharmacy

## 2015-01-07 NOTE — Telephone Encounter (Signed)
Pt receives fluids weekly; is requesting an order to flush with Heparin after infusion. Will need to know how much Heparin to flush in order.Marland KitchenMarland Kitchen

## 2015-01-07 NOTE — Telephone Encounter (Signed)
Needs a refill on Oycodone/Acetaminophen 5-325; please fax to [facility].Marland KitchenMarland Kitchen

## 2015-01-08 ENCOUNTER — Telehealth: Payer: Self-pay | Admitting: *Deleted

## 2015-01-08 NOTE — Telephone Encounter (Signed)
fyi

## 2015-02-07 ENCOUNTER — Telehealth: Payer: Self-pay | Admitting: *Deleted

## 2015-02-13 NOTE — Telephone Encounter (Signed)
Hospice called to report Peter Walters expired on 03-06-2015 at 4:45 AM.  Will notify provider and managed Care of this event.

## 2015-02-13 DEATH — deceased

## 2015-02-27 NOTE — Progress Notes (Unsigned)
Opened in error

## 2015-10-08 IMAGING — CT CT PARANASAL SINUSES W/O CM
3 series · 14 of 47 positions shown, 16 images · non-contrast
Comparison: None.

CLINICAL DATA: Cough and congestion with weakness for 3 days. Sinus
pain and drainage.

EXAM:
CT PARANASAL SINUS WITHOUT CONTRAST
TECHNIQUE: Multidetector CT images of the paranasal sinuses were obtained using
the standard protocol without intravenous contrast.

[Series 5: ax soft · axial · 0.34mm/px · z∈[+364,+473]mm · 8 of 127 slices shown, 10 images]
[im 9/127  brain]
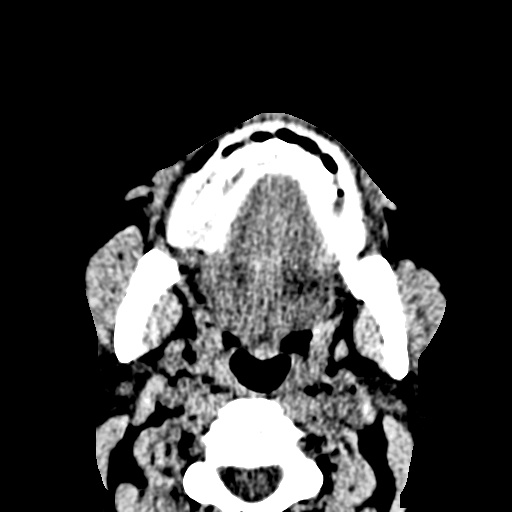
[im 9/127  bone]
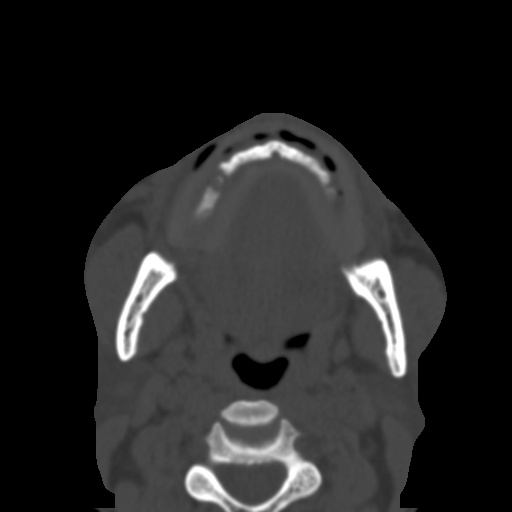
[im 27/127  bone]
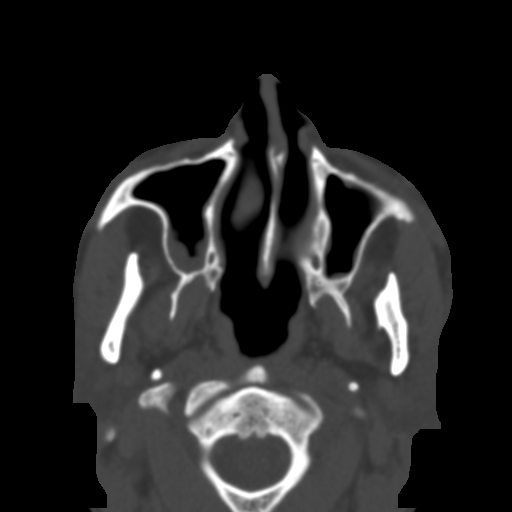
[im 40/127  bone]
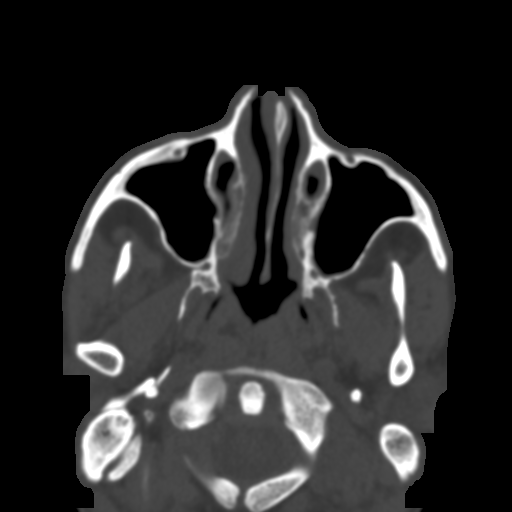
[im 57/127  bone]
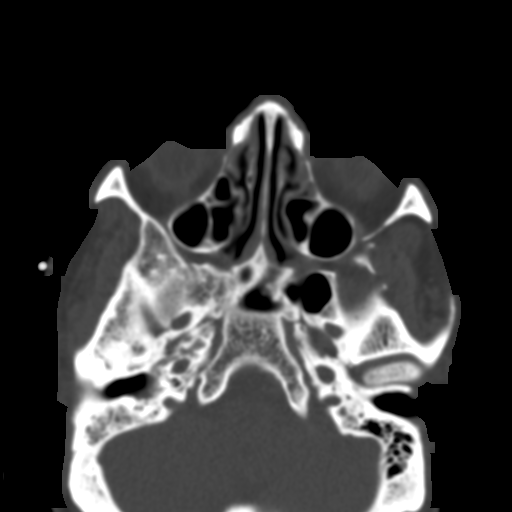
[im 70/127  brain]
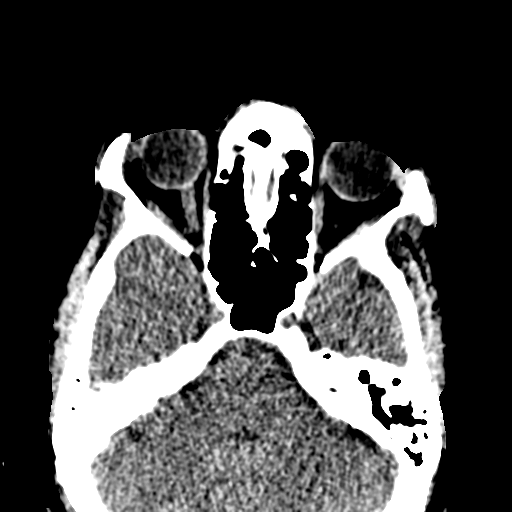
[im 70/127  bone]
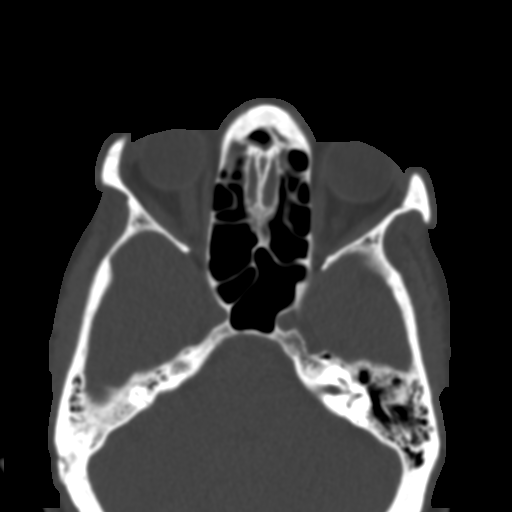
[im 87/127  bone]
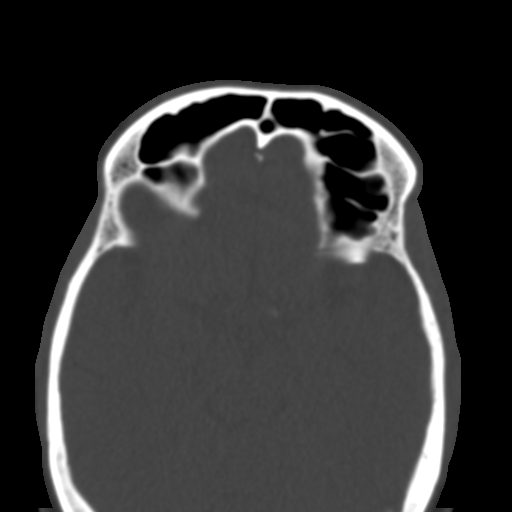
[im 100/127  bone]
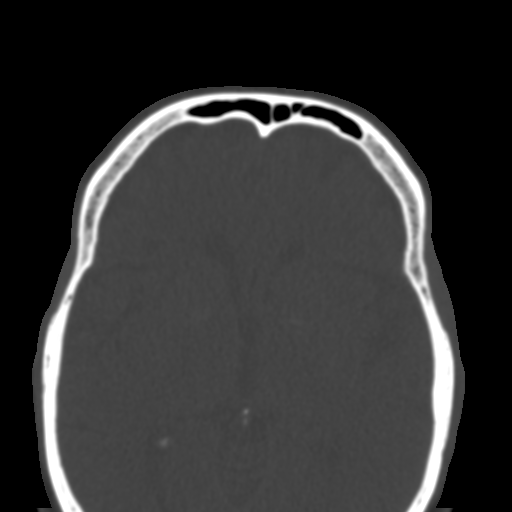
[im 118/127  bone]
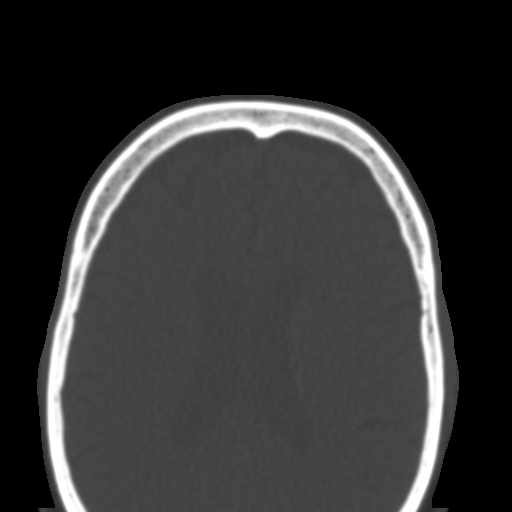

[Series 6: coronal bone · coronal · 0.25mm/px · 3 of 81 slices shown]
[im 27/81  bone]
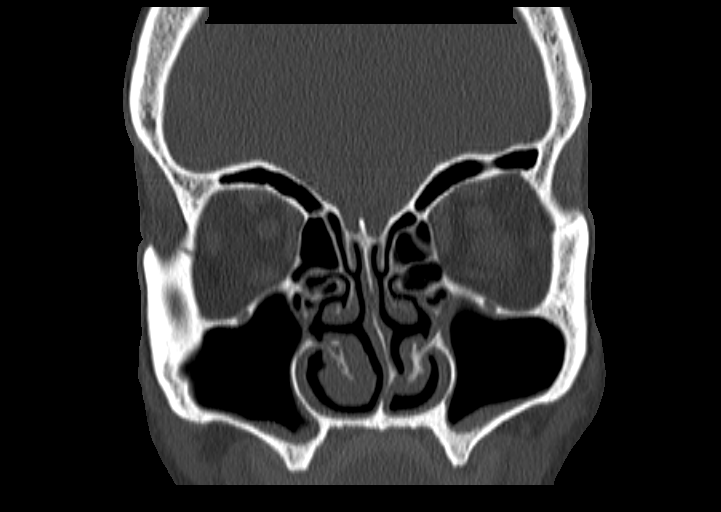
[im 36/81  bone]
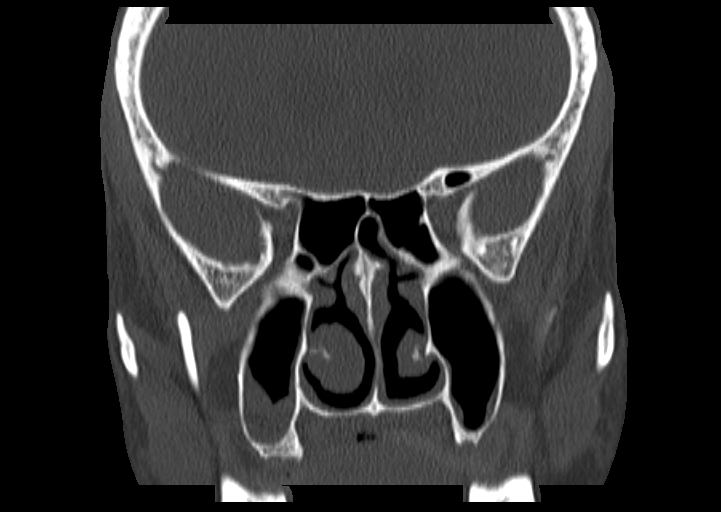
[im 45/81  bone]
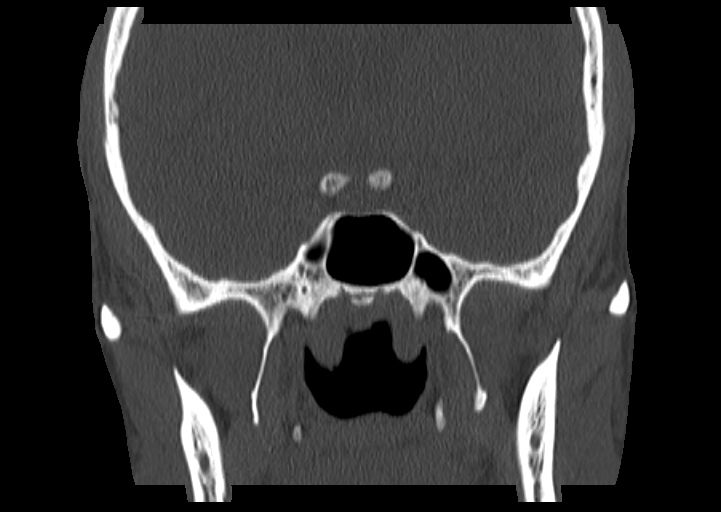

[Series 7: sagittal bone · sagittal · 0.24mm/px · 3 of 89 slices shown]
[im 30/89  bone]
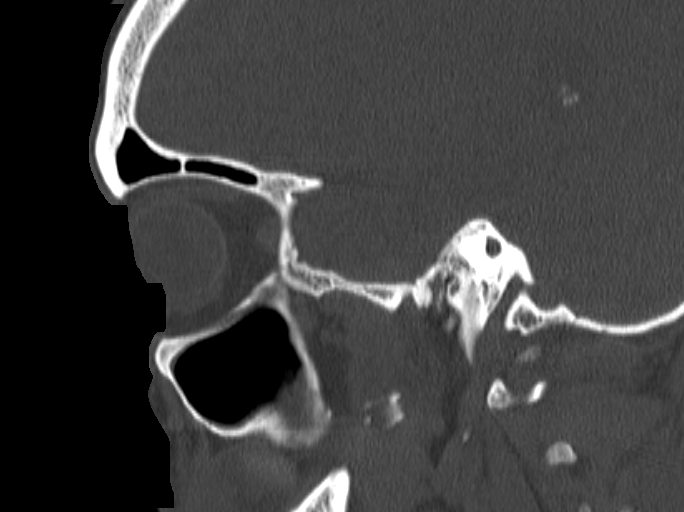
[im 45/89  bone]
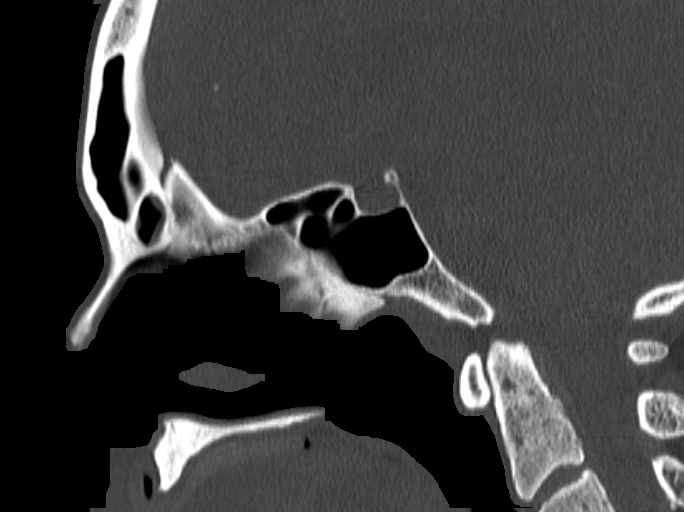
[im 59/89  bone]
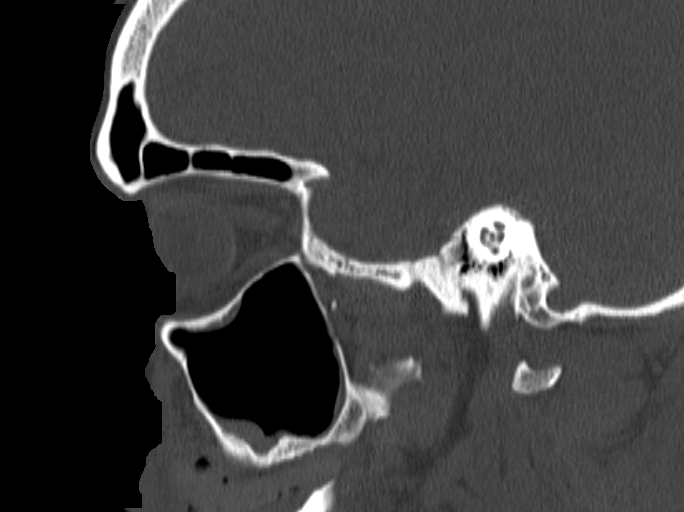

[14 of 47 positions shown; findings below may reference images not displayed]

FINDINGS: The maxillary sinuses display mild polypoid mucosal thickening,
slightly greater on the right. No air-fluid level.

The frontal sinuses are clear.

Mild mucosal thickening bilateral in the anterior ethmoid air cells.

Clear sphenoid sinus.

Nasal septal deviation right to left of 5 mm. Hypertrophied right
inferior turbinate. Bilateral infundibular narrowing, worse on the
left due to inferior ethmoid cells and mucosal thickening along the
uncinate processes.

No nasopharyngeal mass.  Left middle ear and mastoid clear.

Right mastoid air cells demonstrate opacification without
coalescence. The right middle ear is filled with fluid in the right
tympanic membrane is slightly retracted.
IMPRESSION: No acute sinus disease is evident.

Bilateral infundibular narrowing predisposes to chronic sinus
disease.

Right middle ear and mastoid fluid, correlate clinically for
otitis/mastoiditis. No nasopharyngeal obstructing mass is evident.

## 2015-12-03 ENCOUNTER — Other Ambulatory Visit: Payer: Self-pay | Admitting: Nurse Practitioner

## 2016-08-09 IMAGING — US US RENAL
1 series · 14 of 25 positions shown · non-contrast
Comparison: CT abdomen and pelvis 10/13/2014

CLINICAL DATA: Acute renal failure, history atrial fibrillation,
hypertension, multiple myeloma, coronary artery disease, CHF,
diabetes

EXAM:
RENAL / URINARY TRACT ULTRASOUND COMPLETE

[Series 1: us renal · 0.28mm/px · 14 of 60 slices shown]
[im 1/60]
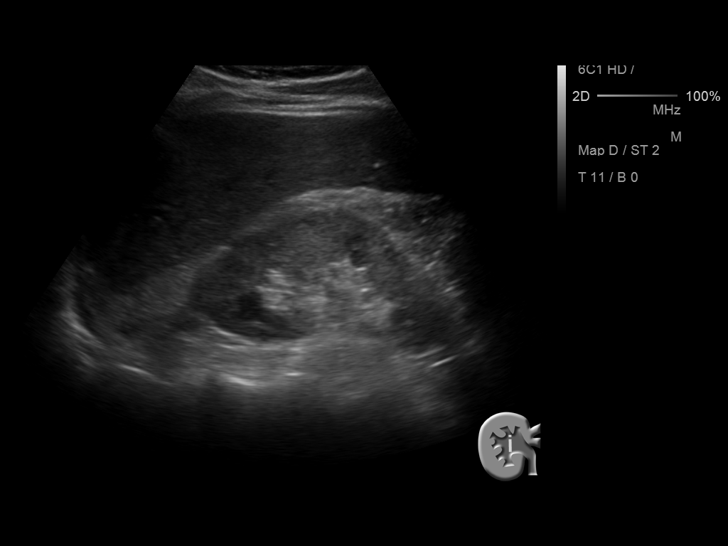
[im 5/60]
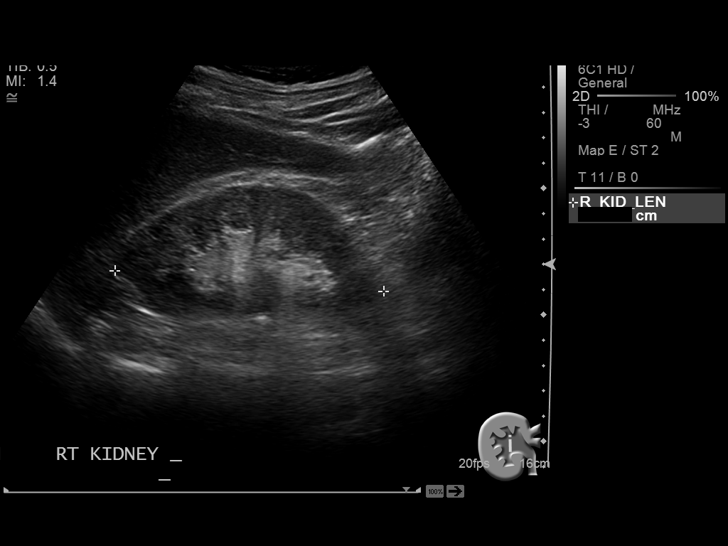
[im 10/60]
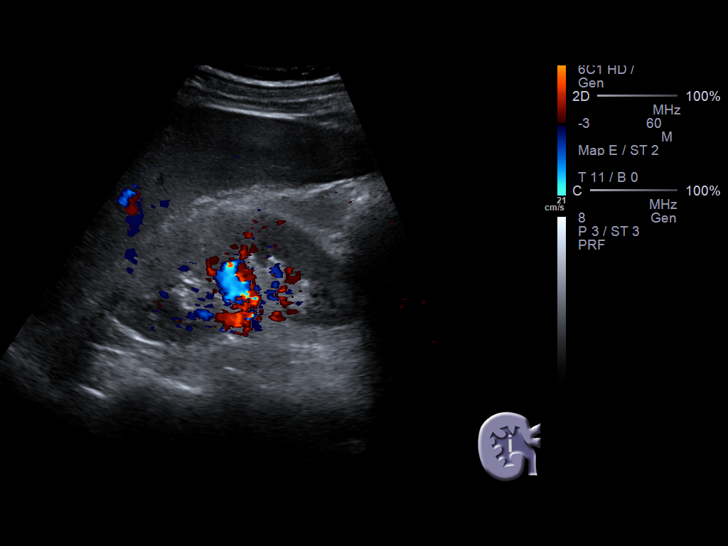
[im 15/60]
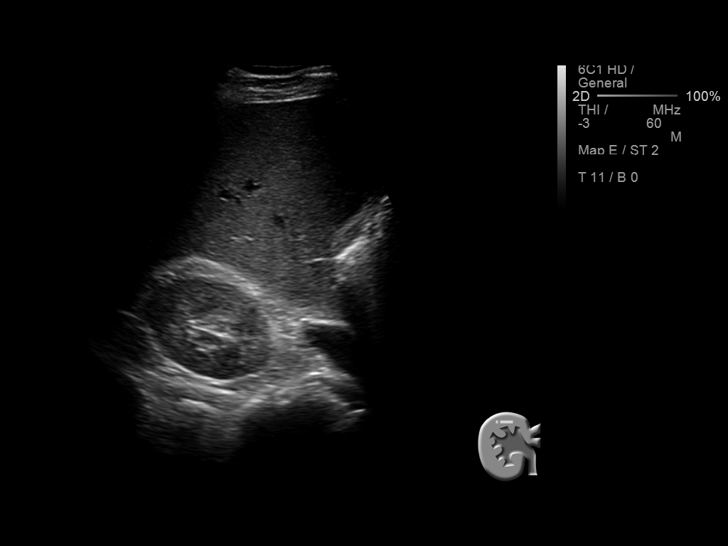
[im 20/60]
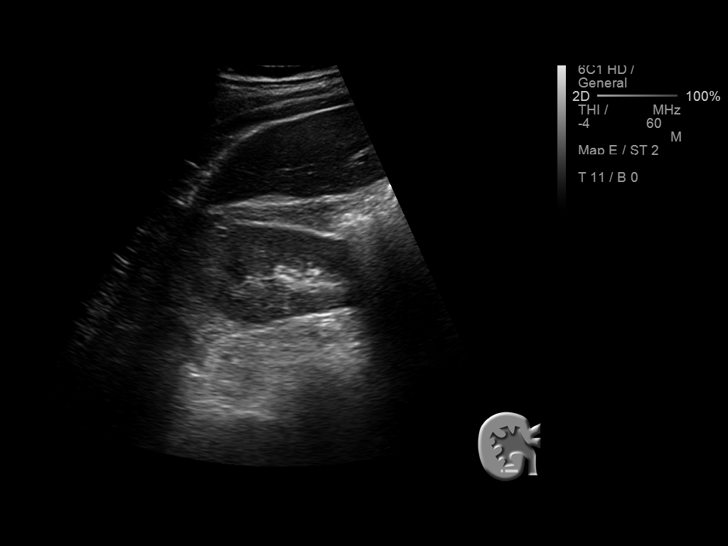
[im 23/60]
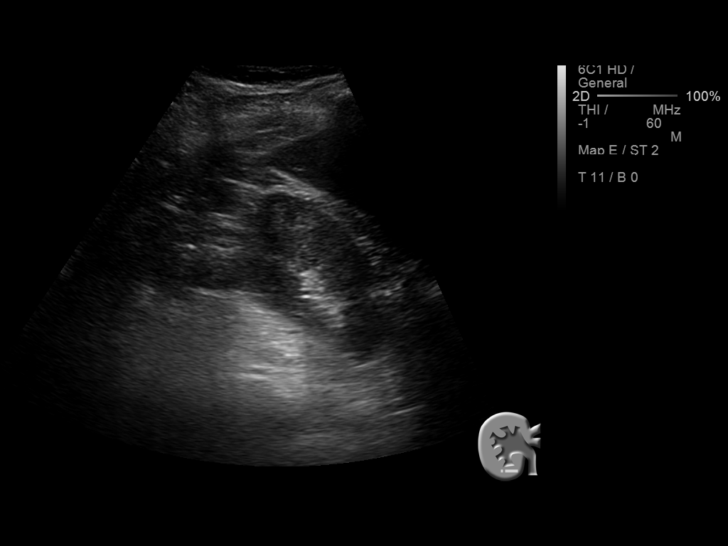
[im 28/60]
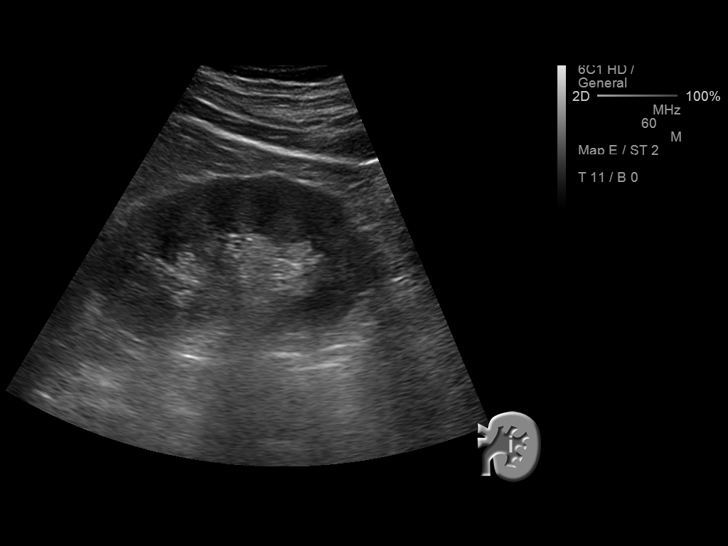
[im 32/60]
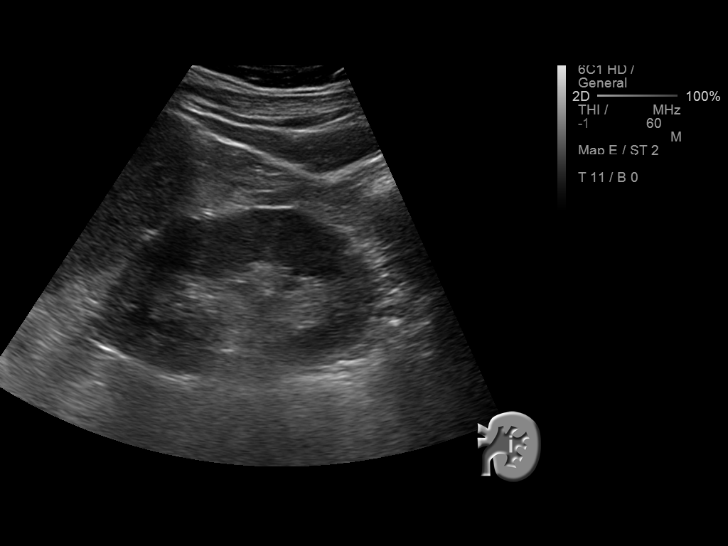
[im 37/60]
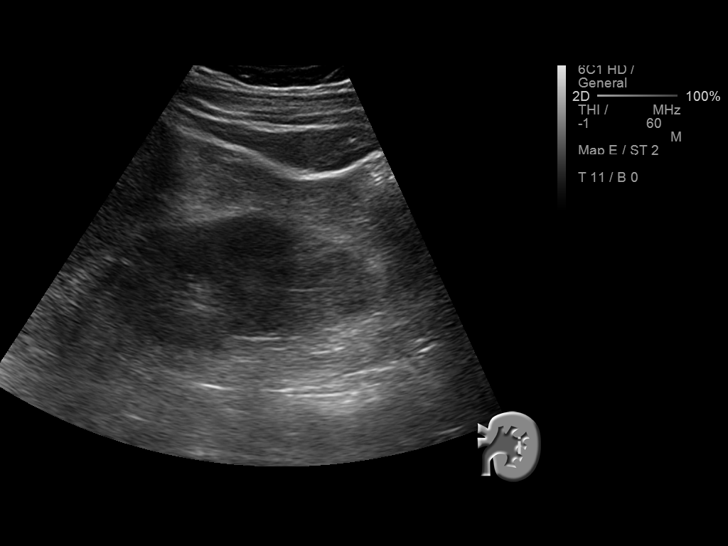
[im 40/60]
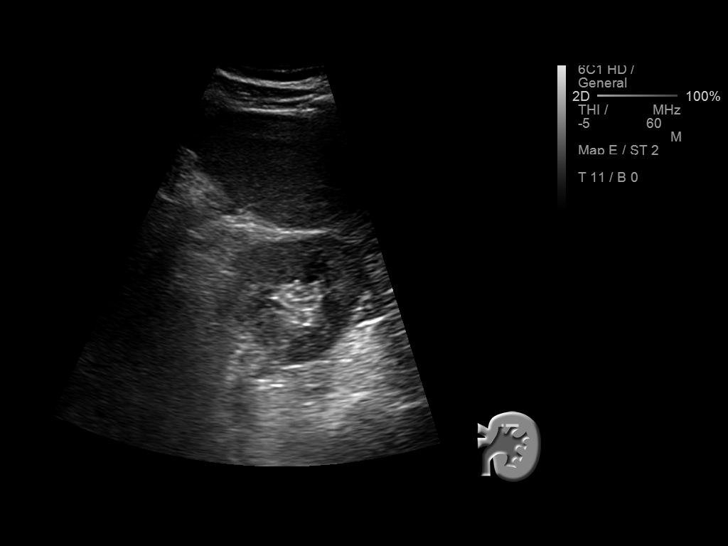
[im 45/60]
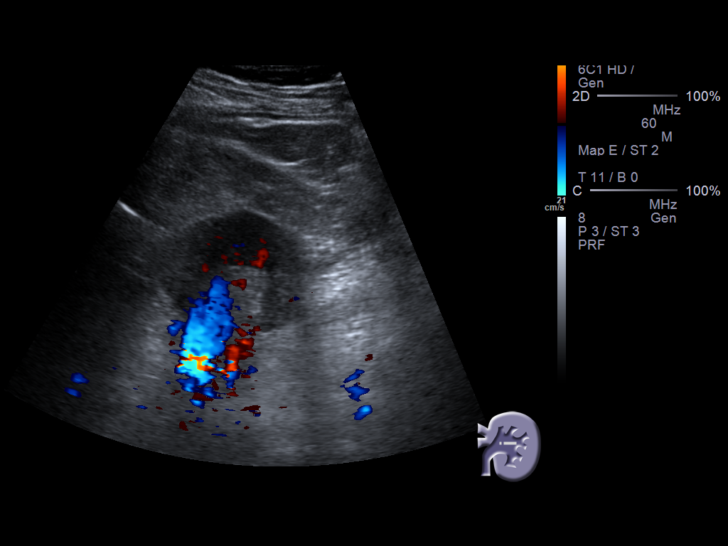
[im 50/60]
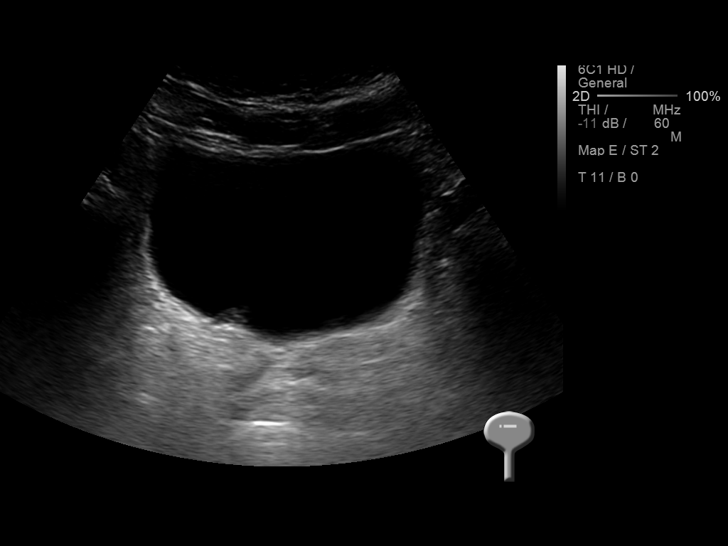
[im 55/60]
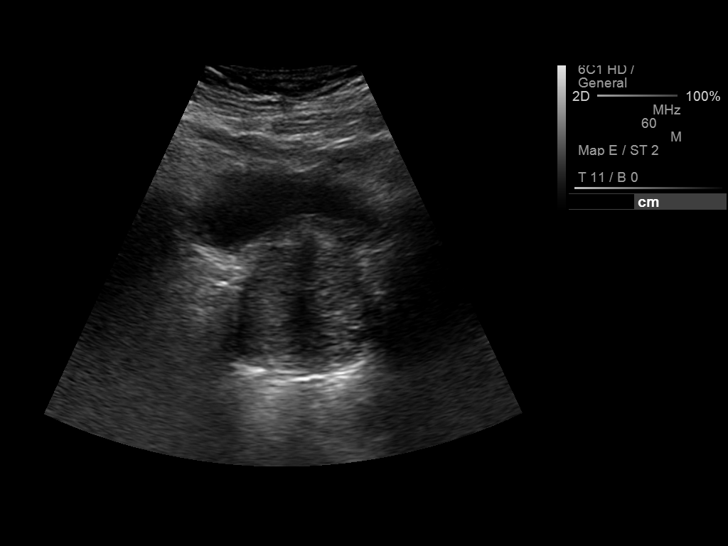
[im 60/60]
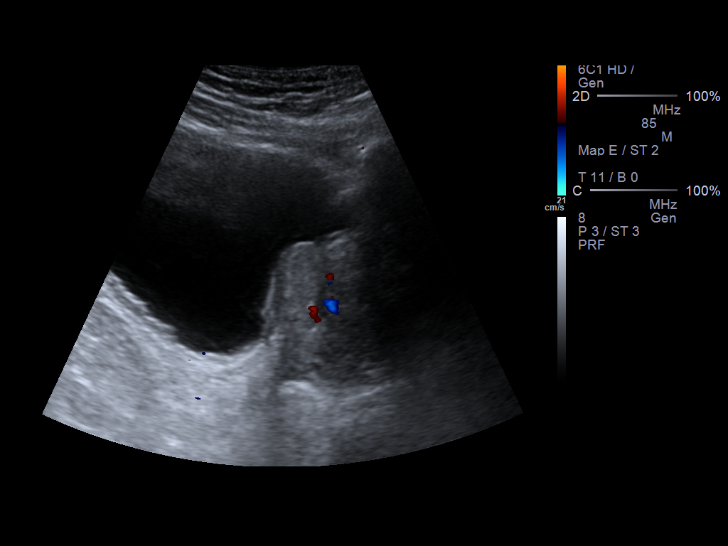

[14 of 25 positions shown; findings below may reference images not displayed]

FINDINGS: Right Kidney:

Length: 10.7 cm. Normal cortical thickness. Upper normal cortical
echogenicity. No mass, hydronephrosis or shadowing calcification. No
perinephric fluid.

Left Kidney:

Length: 10.6 cm. Normal cortical thickness with upper normal
cortical echogenicity. No mass, hydronephrosis or shadowing
calcification. No perinephric fluid.

Bladder:

Normal appearance.
IMPRESSION: No renal sonographic abnormalities identified.
